# Patient Record
Sex: Male | Born: 1950 | Race: White | Hispanic: No | Marital: Single | Smoking: Never smoker
Health system: Southern US, Community
[De-identification: ages and names within clinical notes are randomized; demographics above are authoritative.]

## PROBLEM LIST (undated history)

## (undated) DIAGNOSIS — E039 Hypothyroidism, unspecified: Secondary | ICD-10-CM

## (undated) DIAGNOSIS — Q909 Down syndrome, unspecified: Secondary | ICD-10-CM

## (undated) DIAGNOSIS — K279 Peptic ulcer, site unspecified, unspecified as acute or chronic, without hemorrhage or perforation: Secondary | ICD-10-CM

## (undated) HISTORY — PX: VAGOTOMY: SUR1431

---

## 1998-05-24 ENCOUNTER — Ambulatory Visit (HOSPITAL_COMMUNITY): Admission: RE | Admit: 1998-05-24 | Discharge: 1998-05-24 | Payer: Self-pay | Admitting: Gastroenterology

## 1999-10-17 ENCOUNTER — Encounter: Payer: Self-pay | Admitting: Gastroenterology

## 1999-10-17 ENCOUNTER — Encounter: Admission: RE | Admit: 1999-10-17 | Discharge: 1999-10-17 | Payer: Self-pay | Admitting: Gastroenterology

## 1999-11-23 ENCOUNTER — Encounter: Admission: RE | Admit: 1999-11-23 | Discharge: 1999-11-23 | Payer: Self-pay | Admitting: Gastroenterology

## 1999-11-23 ENCOUNTER — Encounter: Payer: Self-pay | Admitting: Gastroenterology

## 2000-02-05 ENCOUNTER — Ambulatory Visit (HOSPITAL_COMMUNITY): Admission: RE | Admit: 2000-02-05 | Discharge: 2000-02-05 | Payer: Self-pay | Admitting: Gastroenterology

## 2000-02-08 ENCOUNTER — Encounter: Admission: RE | Admit: 2000-02-08 | Discharge: 2000-02-08 | Payer: Self-pay | Admitting: Gastroenterology

## 2000-02-08 ENCOUNTER — Encounter: Payer: Self-pay | Admitting: Gastroenterology

## 2000-03-01 ENCOUNTER — Ambulatory Visit (HOSPITAL_COMMUNITY): Admission: RE | Admit: 2000-03-01 | Discharge: 2000-03-01 | Payer: Self-pay | Admitting: Gastroenterology

## 2000-03-01 ENCOUNTER — Encounter (INDEPENDENT_AMBULATORY_CARE_PROVIDER_SITE_OTHER): Payer: Self-pay | Admitting: Specialist

## 2000-05-08 ENCOUNTER — Ambulatory Visit (HOSPITAL_COMMUNITY): Admission: RE | Admit: 2000-05-08 | Discharge: 2000-05-08 | Payer: Self-pay | Admitting: Gastroenterology

## 2000-05-08 ENCOUNTER — Encounter (INDEPENDENT_AMBULATORY_CARE_PROVIDER_SITE_OTHER): Payer: Self-pay | Admitting: Specialist

## 2000-08-21 ENCOUNTER — Encounter: Admission: RE | Admit: 2000-08-21 | Discharge: 2000-08-21 | Payer: Self-pay | Admitting: Gastroenterology

## 2000-08-21 ENCOUNTER — Encounter: Payer: Self-pay | Admitting: Gastroenterology

## 2000-12-10 ENCOUNTER — Encounter: Admission: RE | Admit: 2000-12-10 | Discharge: 2000-12-10 | Payer: Self-pay | Admitting: Family Medicine

## 2000-12-10 ENCOUNTER — Encounter: Payer: Self-pay | Admitting: Family Medicine

## 2003-04-20 ENCOUNTER — Ambulatory Visit (HOSPITAL_BASED_OUTPATIENT_CLINIC_OR_DEPARTMENT_OTHER): Admission: RE | Admit: 2003-04-20 | Discharge: 2003-04-20 | Payer: Self-pay | Admitting: Family Medicine

## 2003-10-01 ENCOUNTER — Ambulatory Visit: Admission: RE | Admit: 2003-10-01 | Discharge: 2003-10-01 | Payer: Self-pay | Admitting: Critical Care Medicine

## 2003-10-04 ENCOUNTER — Ambulatory Visit (HOSPITAL_BASED_OUTPATIENT_CLINIC_OR_DEPARTMENT_OTHER): Admission: RE | Admit: 2003-10-04 | Discharge: 2003-10-04 | Payer: Self-pay | Admitting: Internal Medicine

## 2004-10-17 ENCOUNTER — Encounter: Admission: RE | Admit: 2004-10-17 | Discharge: 2004-10-17 | Payer: Self-pay | Admitting: Family Medicine

## 2005-03-30 ENCOUNTER — Encounter: Admission: RE | Admit: 2005-03-30 | Discharge: 2005-03-30 | Payer: Self-pay | Admitting: Family Medicine

## 2005-04-10 ENCOUNTER — Inpatient Hospital Stay (HOSPITAL_COMMUNITY): Admission: EM | Admit: 2005-04-10 | Discharge: 2005-04-13 | Payer: Self-pay | Admitting: Emergency Medicine

## 2005-09-03 HISTORY — PX: ROUX-EN-Y PROCEDURE: SUR1287

## 2006-01-14 ENCOUNTER — Ambulatory Visit (HOSPITAL_COMMUNITY): Admission: RE | Admit: 2006-01-14 | Discharge: 2006-01-14 | Payer: Self-pay | Admitting: Surgery

## 2006-01-22 ENCOUNTER — Encounter (INDEPENDENT_AMBULATORY_CARE_PROVIDER_SITE_OTHER): Payer: Self-pay | Admitting: Specialist

## 2006-01-22 ENCOUNTER — Inpatient Hospital Stay (HOSPITAL_COMMUNITY): Admission: RE | Admit: 2006-01-22 | Discharge: 2006-01-30 | Payer: Self-pay | Admitting: Surgery

## 2007-07-10 ENCOUNTER — Encounter: Admission: RE | Admit: 2007-07-10 | Discharge: 2007-07-10 | Payer: Self-pay | Admitting: Surgical Oncology

## 2011-01-19 NOTE — H&P (Signed)
NAME:  Isaac Ramirez, Isaac Ramirez          ACCOUNT NO.:  [id number]   MEDICAL RECORD NO.:  [id number]          PATIENT TYPE:  EMS   LOCATION:  MAJO                         FACILITY:  [facility]   PHYSICIAN:  Jackie Plum, M.D.DATE OF BIRTH:  Jul 08, 1951   DATE OF ADMISSION:  04/10/2005  DATE OF DISCHARGE:                                HISTORY & PHYSICAL   CHIEF COMPLAINT:  Syncope.   The patient has history of Down's syndrome, and history is mostly given by  patient's mother at bedside.  The patient was brought to the ED on account  of recurrent syncopal episodes today.  According to the mother, the patient  has had some problems with heartburn and has been seeing Dr. Kinnie Scales.  He  last had upper endoscopy done about a week ago, and they noted gastritis.  (From E chart, the patient had an EGD done in September 2001 by Dr. Vida Rigger.  At that time, peripyloric ulcer and obstruction was noted.  According to the mother, since the endoscopy, the patient had been on a  liquid diet.  They went to see Dr. Kinnie Scales today because he was having dry  heaves.  During that visit, he felt the patient should be advanced to a  puree diet.  In addition, the patient has had some lower extremity swelling  for which he was put on Lasix.   Unfortunately, they went home today and this afternoon the patient passed  out while standing, and he was unresponsive and unconscious for a few  minutes.  They got him up, and subsequently he passed out again and also  passed out in the field after being seen by EMT.  He was brought to the ED  whereby initial workup indicated blood pressure of 70/50.  He was given 500  mL normal saline bolus.  The patient continued to be anemic with hemoglobin  of 8.1, and therefore, packed red blood cells transfusion was initiated, and  hospitalists asked for admission.  The patient denies any history of chest  pain.  He says that he is feeling weak and fatigued but denies any shortness  of  breath.  He also indicates that he does not have any abdominal pain but  feels a bit unusual with some abdominal discomfort.   PAST MEDICAL HISTORY:  1.  History of Down's syndrome.  2.  Arthritis.  3.  Gallstones.  4.  Heart murmur.  5.  Sleep apnea.  6.  Ulcer.   CURRENT MEDICATIONS:  Include Nexium, Prilosec, Levothyroxine, hyoscyamine,  Flagyl.  (According to the mother, he had some form of liquid stools for  which he was treated with Flagyl.  Suspect the patient may have had some C.  difficile colitis from the history, but she indicates that his diarrhea has  resolved.)  He also takes multivitamins, Lasix, and calcium.   ALLERGIES:  He is allergic to PENICILLIN.   FAMILY HISTORY:  Negative for any GI problems.   SOCIAL HISTORY:  The patient lives with his mother because of his Down's  syndrome.  Does not smoke cigarettes nor drink alcohol.   PHYSICAL EXAMINATION:  VITAL SIGNS:  Blood pressure 104/61, temperature  98.4, pulse 81, respirations recommended to be 11, but I counted 16 per  minute.  Oxygen 100% on 2 liters by nasal cannula.  GENERAL:  He was not in acute distress.  HEENT:  Normocephalic and atraumatic.  Pupils equal, round, and reactive to  light.  Extraocular movements were intact.  Oropharynx was slightly dry.  He  had pallor present, no icterus.  LUNGS:  Clear to auscultation.  CARDIAC:  Regular.  No gallops.  ABDOMEN:  Full.  Looks distended but nontender.  Bowel sounds are present.  EXTREMITIES:  No cyanosis, no edema.  CNS:  The patient was alert and oriented x 2.  He moves all extremities.  Obeys simple commands.  No obviously focal deficits otherwise.   LABORATORY DATA:  EKG shows sinus rhythm.  There are no acute ST wave  changes.   UA was negative for urinary tract infection.  WBC 11.1, hemoglobin 8.1,  hematocrit 24.5, MCV 98.1, platelet count 597.  Sodium 138, potassium 4.2,  chloride 110, CO2 19, glucose 86, BUN 33, creatinine 2.8, calcium 7.7,  total  protein 5.5, albumin 1.8, AST 16, ALT 11, alkaline phosphatase 61, total  bilirubin 0.4.  Point-of-care cardiac markers were negative.  Hemoccult  stools was positive.   IMPRESSION:  1.  Acute hypotension with anemia and heme-positive stools.  Rule out      gastrointestinal bleed.  2.  Dehydration with possible volume depletion.  3.  Syncope secondary to diagnoses #1 and #2.  4.  Acute renal failure.  5.  Renal insufficiency, not clear if acute or chronic.  I tried to trend on      E chart his previous renal function, but this is all that came up.   PLAN:  1.  The patient will be admitted to a step-down bed.  2.  Will continue with IV fluids supplementation and hydration and follow up      his renal function.  3.  Will complete packed red blood cells transfusion and follow up his      serial hemoglobin and hematocrit.  4.  Dr. Kinnie Scales, patient's GI physician, was paged in ED, but did not get a      response.  I, therefore, put a call to Dr.      Ramon Dredge of [facility], who was on call for unassigned, and discussed the      patient with him.  He may see patient if Dr. Kinnie Scales is never heard from      tonight.  5.  Will check his TSH and also check KUB for completeness sake.       GO/MEDQ  D:  04/10/2005  T:  04/10/2005  Job:  [id number]   cc:   Griffith Citron, M.D.  [facility] [place]  [place] [address]  Fax: [phone]

## 2011-01-19 NOTE — Op Note (Signed)
NAME:  Isaac Ramirez, Isaac Ramirez          ACCOUNT NO.:  [id number]   MEDICAL RECORD NO.:  [id number]          PATIENT TYPE:  INP   LOCATION:  3307                         FACILITY:  [facility]   PHYSICIAN:  Sandria Bales. Ezzard Standing, M.D.  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   OPERATION PERFORMED:  Upper endoscopy, truncal vagotomy, retrocolic Roux-en-  Y gastrojejunostomy, Stamm gastrostomy.   SURGEON:  Sandria Bales. Ezzard Standing, M.D.   FIRST ASSISTANT  Baruch Merl, M.D.   ANESTHESIA:  General.   INDICATIONS FOR PROCEDURE:  Isaac Ramirez is a 60 year old male who has  Down's syndrome, who is taken care of his mother and Dr. Ritta Slot is his  gastroenterologist, Dr. Lupe Carney is his primary care physician.  He has  had progressive duodenal ulcer disease felt to be secondary to chronic NSAID  use.  This ulcer disease has caused a worsening gastric outlet obstruction  and he now comes for abdominal exploration with possible bypass surgery and  possible antrectomy.   The indications and potential complications were explained to the patient's  mother.  Again, there was some deformity in the duodenum both on upper GI  and CT scan which suggests this could be a potential occult malignancy.  Dr.  Kinnie Scales scoped the patient twice with no evidence of malignancy seen.  This  was all explained to the mother preoperatively.   DESCRIPTION OF PROCEDURE:  The patient was placed in supine position, given  a general endotracheal anesthesia, had a Foley catheter in place.  Of note,  he has a small meatus and I had to put a 12 Foley catheter in but I got this  in without difficulty.  He was given 400 mg of Cipro  preoperatively.  His  upper abdomen was shaved, prepped with Betadine solution and sterilely  draped.   I went through an upper midline incision to the abdominal cavity.  Abdominal  exploration revealed right and left lobes of the liver unremarkable.  The  gallbladder I could not palpate any stones or masses.  His stomach was noted  to be chronically thickened, probably secondary to chronic obstruction.  He  had a firm mass at his duodenal sweep with some scarring around that is  consistent with duodenal ulcer disease in the first and second portion of  the duodenum.  His small bowel and lower abdomen was otherwise unremarkable.   Starting first by trying to dissect down and see if I thought I could do an  antrectomy, we had a lot of trouble identifying the pylorus externally.  I  took down some of the greater curvature to see if I could feel this with my  fingers though he had enough deformity of the duodenal bulb it was really  hard to tell where the pylorus ended and where the duodenum began, so we  then did  an upper endoscopy and actually right beyond the pylorus, I think  it was like a deep ulcer or channel.  This upper endoscopy was done by Dr.  Colin Benton while I was holding the scope with an open abdomen.  It seemed that he  had a markedly deformed duodenum, there would be a significant risk if doing  an antrectomy, of duodenal blow out or leak, I felt the best thing was not  to try to do a antrectomy, just do a bypass procedure.  I then went up and  found what I thought was an anterior vagus nerve which was removed between  two clips.  The posterior vagus nerve was removed in between two clips.  There was no other nerve structure that I could identify, no other  suspicious area of any anterior or posterior nerve.   I then went down below the ligament of Treitz.  I followed the small bowel  about 30 cm beyond the ligament of Treitz and did a gastrojejunostomy  approximately 50 cm  beyond what was to be my future bypass.  I did a stapled  side-to-side anastomosis. I closed the enterotomy with two running #2-0  Vicryl sutures.  I then buttressed this with some more 2-0 Vicryl sutures,  closed the mesenteric defect with a 2-0 silk suture.  This then allowed  easily two fingerbreadths through this anastomosis.   I then brought through a retrocolic approach the gastric limb of the Roux-en-  Y.  I sewed this to the posterior wall first with a posterior running 2-0  Vicryl suture.  I then used the 45 Endo GIA stapler with a blue load to put  a staple load on the back wall of the stomach through enterotomy to create a  gastrojejunostomy. I  then closed enterotomy with two running 2-0 Vicryl  sutures and then I closed the anterior wall over the jejunum with a running  2-0 Vicryl.   After this was completed, there was at least a two to three fingerbreadth  opening.  I really thought I had a 4 to 5 cm opening on the posterior wall  of the stomach.  I was not able to bring this down below the transverse  colon mesentery so I then tacked the transverse colon mesentery to the  jejunum in two spots.  Then I placed Tisseel over the gastrojejunostomy to  seal over the jejunojejunostomy.  This lay flat.  I thought it had ample  room for the small bowel to empty.  There was no twist in the small bowel or  the Roux limb.  I then returned the colon to its normal location.   I then irrigated the abdomen out with about 2 L of saline, reinspected the  area where I had done the vagotomy, reinspected the duodenal C-loop which I  had kocherized, reinspected the gastrojejunostomy, reinspected the  jejunojejunostomy.   Because the patient has Down's syndrome and though that he could have a  prolonged NG tube, I thought he would be best served by placing a  gastrostomy so I used a cut off 24 mushroom catheter, I placed through the anterior abdominal wall, through a double pursestring on the  anterior wall  of the stomach.  I tacked the stomach up in four sites with 2-0 Vicryl  suture, I put the gastrostomy tube to straight drain.   I then closed the abdomen with two running #1 PDS sutures.  I placed skin  staples to the skin. I  sterilely dressed the wound.  The patient tolerated  the procedure well, was transported to recovery room in good condition.      Sandria Bales. Ezzard Standing, M.D.  Electronically Signed     DHN/MEDQ  D:  01/22/2006  T:  01/23/2006  Job:  [id number]   cc:   Griffith Citron, M.D.  Fax: [phone]   L. Lupe Carney, M.D.  Fax: [phone]   Shan Levans, M.D. [facility]  [address]  [place]  [place] [address]

## 2011-01-19 NOTE — Op Note (Signed)
NAME:  Isaac Ramirez, Isaac Ramirez          ACCOUNT NO.:  [id number]   MEDICAL RECORD NO.:  [id number]          PATIENT TYPE:  INP   LOCATION:  4708                         FACILITY:  [facility]   PHYSICIAN:  Bernette Redbird, M.D.   DATE OF BIRTH:  [birth year]   DATE OF PROCEDURE:  04/12/2005  DATE OF DISCHARGE:                                 OPERATIVE REPORT   PROCEDURE:  Flexible sigmoidoscopy.   INDICATION:  This is a 60 year old Downs patient with small volume  hematochezia in the hospital today, having been admitted with heme-positive  stool, syncope, hypotension and anemia, in the setting of a recently  diagnosed recurrent prepyloric ulcer and Lasix usage for fluid retention.   FINDINGS:  Mild to moderate internal hemorrhoids, one of which appeared to  have a possible stigma of recent hemorrhage. No blood in the GI tract.   PROCEDURE:  The nature, purpose, risks of the procedure had been reviewed  with the patient's mother who provided consent on his behalf. He was brought  from his hospital room to the endoscopy unit. The procedure was done  unprepped. No sedation was administered. Perianal exam was unremarkable  apart from a small excoriation in the gluteal cleft. Digital exam showed  what felt to be palpable hemorrhoids within the anal canal and a very small  prostate gland, no masses.   The Olympus pediatric adjustable video colonoscope was inserted and advanced  to about 40 cm and pullback was then performed.   There was absolutely no blood whatsoever anywhere in this examination.   The patient did have several clumps of formed brown stool scattered in the  rectosigmoid region. The stool was free of any inspissated blood or  streaking of blood. The mucosa of the colon looked healthy without any  evident colitis and I did not appreciate any diverticulosis, vascular  ectasia, polyps or masses. Retroflexion in the rectum was unremarkable.   Careful pullout through the anal canal  demonstrated moderate internal  hemorrhoids, one of which had a purple mucosal blob and suggestive of a  stigma of recent hemorrhage.   No biopsies were obtained. The patient tolerated the procedure well and  there no apparent complications.   IMPRESSION:  Rectal bleeding, presumably of rectal outlet origin given the  negative findings on today's exam in the rectum and sigmoid, and given the  presence of internal hemorrhoids as noted above (569.3).   PLAN:  Expectant management. Continue ulcer therapy.       RB/MEDQ  D:  04/12/2005  T:  04/13/2005  Job:  [id number]   cc:   L. Lupe Carney, M.D.  [address] [place]  [place] [address]  Fax: [phone]   Griffith Citron, M.D.  [facility] [place]  [place] [address]  Fax: [phone]

## 2011-01-19 NOTE — Procedures (Signed)
[place]. [facility]  Patient:    Isaac Ramirez, Isaac Ramirez                 MRN: [id number] Proc. Date: 02/05/00 Adm. Date:  [id number] Attending:  Nelda Marseille CC:         Cherlyn Labella, M.D., [facility] [facility], [place]                           Procedure Report  PROCEDURE:  Esophagogastroduodenoscopy.  INDICATIONS:  A patient with questionable sprue.  Want to repeat endoscopy and proceed with small bowel biopsies.  Consent was signed after the risks, benefits, methods, and options were thoroughly discussed multiple times in the past with Mrs. Holtsclaw.  MEDICINES USED:  Demerol 30 mg and Versed 5 mg.  DESCRIPTION OF PROCEDURE:  We initially started the procedure with the pediatric endoscope which was advanced to the stomach.  He seemed to possibly have some pyloric narrowing, but we could not tell for sure.  The scope seemed to continue to loop in the stomach and this did not seem to be flexible enough to advance through the pylorus.  The scope was retroflexed, which revealed a normal cardia, fundus, angularis, and lesser and greater curves.  The stomach was evaluated on straight visualization.  The scope was slowly withdrawn since it could not be advanced through the pylorus and his esophagus was normal, except for a small hiatal hernia.  We went ahead and tried to advance the pediatric colonoscope next to see if a firmer scope would be helpful, but this was too big to get through the pylorus and it was fairly quickly withdrawn. We reinserted the regular 140 upper endoscope which again would not go through the pylorus due to seemingly pyloric stenosis.  There was some trauma to the pylorus in trying to pass these scopes, which did have some friability.  No active significant bleeding.  The stomach was reevaluated on retroflex and straight visualization without additional findings.  Air was suctioned and the scope was withdrawn.  Again a good look at  the esophagus confirmed the above findings.  The scope was removed.  The patient tolerated the procedure adequately.  There was no obvious immediate complication.  ENDOSCOPIC DIAGNOSES: 1. Small hiatal hernia. 2. 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  Unable to    advance either our 160 pediatric colonoscope or our 140 scope.  PLAN: 1. Will check his old EGD dictation to see if this was a problem in the past,    but I do not remember it. 2. Probably will proceed with repeat upper GI with small bowel follow through    to reevaluate his anatomy. 3. Consideration of a balloon dilatation to pass the scope was discussed with    Dr. Link Snuffer to get any further work-up or plans and ideas from him. DD:  02/05/00 TD:  02/07/00 Job: [id number]

## 2011-01-19 NOTE — Procedures (Signed)
[place]. [facility]  Patient:    Isaac Ramirez, Isaac Ramirez                 MRN: [id number] Proc. Date: 03/01/00 Adm. Date:  [id number] Attending:  Nelda Marseille CC:         Petra Kuba, M.D.             Randel Pigg, M.D., [facility], [place]                           Procedure Report  PROCEDURE:  Esophagogastroduodenoscopy with biopsy.  INDICATIONS:  Patient with pyloric stenosis on last endoscopy, want a repeat endoscopy balloon to dilate the pylorus and also take a biopsy for possible sprue.  INFORMED CONSENT:  Consent was signed after risk, benefits, methods and options were thoroughly discussed with the mother on multiple occasions.  MEDICINES USED:  Demerol 40 mg, Versed 4 mg.  DESCRIPTION OF PROCEDURE:  The video endoscope was inserted by direct vision. In the esophagus he had some small linear esophageal ulcers, not noticed before.  He did have a small hiatal hernia.  The scope was inserted into the stomach and some old food was seen, some of which was washed and suctioned. The scope was then advanced to the antrum and in the pylorus there was a even more stricturing than had been seen three weeks ago, and now he had an active peripyloric ulcer.  The scope cannot be advanced through this area.  The ulcer had a deep white base.  We elected not to balloon dilate at this time due to the active ulceration and increased risk.  We cannot see through the pylorus. The scope was withdrawn back to the stomach and retroflexed.  The cardia and the fundus were normal.  The remainder of the stomach was normal on retroflexion and straight visualization except for the area under the residual  food that could not be suctioned, which was in the proximal stomach.  We then advanced to the antrum.  Biopsy for the CLOtest was obtained.  Two biopsies of the antrum and two of the fundus were obtained to rule out Helicobacter and then we took  two small biopsies of the edge of the proximal ulcer.  Air was suctioned and the scope slowly withdrawn.  Again, a good look at the esophagus confirmed the above findings.  The scope was removed.  The patient tolerated the procedure well.  There was no obvious immediate complications.  ENDOSCOPIC DIAGNOSES: 1. Linear mild esophageal ulcers. 2. Small hiatal hernia. 3. Peripyloric deep ulcer with pyloric scarring, unable to pass the scope    or even see into the bulb. 4. Otherwise within normal limits, status post biopsy of the stomach for    both CLO and Helicobacter as well as two biopsies of the edge of the ulcer.  PLAN:  Await pathology to rule out Helicobacter.  We will discuss avoiding salads or other foods difficult to digest.  He will chew his food well.  Six small meals a day.  Prilosec 40 mg a day.  Call p.r.n. and otherwise followup in one to two months.  I have discussed everything thoroughly with his wife about the risk of this getting worse with healing the ulcer and him needing surgery and she will be thinking about those options.  In the meantime I think the warning signs of things getting worse would be obvious and  it would be okay for him to go to camp since she knows the director well and we could get him back here if any question or problems fairly quickly, and otherwise I will see him after camp and probably proceed with repeat endoscopy at that time for reevaluation.  He will avoid aspirin, nonsteroidals which he has not been on. DD:  03/01/00 TD:  03/02/00 Job: [id number]

## 2011-01-19 NOTE — Consult Note (Signed)
NAME:  Isaac Ramirez, Isaac Ramirez          ACCOUNT NO.:  [id number]   MEDICAL RECORD NO.:  [id number]          PATIENT TYPE:  INP   LOCATION:  4708                         FACILITY:  [facility]   PHYSICIAN:  Bernette Redbird, M.D.   DATE OF BIRTH:  Nov 18, 1950   DATE OF CONSULTATION:  04/12/2005  DATE OF DISCHARGE:                                   CONSULTATION   Dr. Cammie Mcgee L. Lendell Caprice of the [facility] asked [place] to see this 60-  year-old patient of Dr. Jennye Boroughs because of small volume hematochezia.   Isaac Ramirez is a 60 year old with Down syndrome and a previous history of peptic  ulcer disease diagnosed by Dr. Lavada Mesi Magod five or more years ago.   With that background, the patient had been doing poorly in recent weeks  while at summer camp, with swelling of his extremities, apparently arthritis  symptoms, and lower extremity edema. When checked in Dr. Quita Skye office  on March 28, 2005, he was found to have hemoglobin of 9.2, as compared to  14.5 in June 2005 roughly a year earlier. After this, he underwent  endoscopic evaluation by Dr. Kinnie Scales and was found to have a significant  prepyloric ulcer. The patient was put on a softer pureed diet because he had  been having a lot of gagging during meals and regurgitation of fluid. Those  symptoms had settled down pretty well.   He was then admitted to this hospital two days ago after becoming syncopal  at home ironically shortly after being seen in the office by Dr. Kinnie Scales. On  admission, the patient's hemoglobin was low at 8.1, roughly a gram and half  lower than it had been in the office for Dr. Clovis Riley, and he was also  hypotensive, perhaps due to Lasix therapy but with an elevated BUN of 43 and  creatinine of 2.9. He was hemoccult positive on admission, although it is  not clear that he was actually having any visibly bloody stool. Prior to  admission, his mother had been checking all of his bowel movements and had  not noticed any tarry  stools.   The patient was being treated with supportive care in-house but today passed  red blood into the commode in association with the bowel movement. It is  thought that it was probably about a cup of blood but nonetheless,  considering all factors, it was felt appropriate to consult GI in the  setting and Dr. Kinnie Scales was out of town and not available.   Note, that the patient had a negative colonoscopy by Dr. Kinnie Scales  approximately two years ago, per discussion with Dr. Lendell Caprice.   The patient's hemoglobin today has been essentially stable over the past 24  hours, dropping from 10.9-10.3. The patient's BUN is improved at 14.   The patient has been maintained on PPI therapy chronically for years and has  not been on ulcerogenic medications to my knowledge.   ALLERGIES:  Stated allergy to PENICILLIN.   OUTPATIENT MEDICATIONS:  Imodium, lactate, levothyroxine, metronidazole,  Tylenol, vitamins, Lasix, Nexium, and possibly digestive enzymes.   OPERATIONS:  None of which I am aware.  MEDICAL ILLNESSES:  1.  History of Down syndrome.  2.  Arthritis.  3.  Gallstones (not operated).  4.  History of heart murmur.  5.  Sleep apnea.  6.  History of ulcer disease as noted above.   HABITS:  Nonsmoker, nondrinker.   FAMILY HISTORY:  Not obtained.   SOCIAL HISTORY:  Lives at home with his mother who is at the bedside and is  very attentive and helpful.   REVIEW OF SYSTEMS:  Pertinent per HPI. The patient's appetite had been poor  recently but more recently has improved back to its baseline and he has been  free of the gagging noted. He has a tendency toward IBS and spastic colon  which is treated with multiple medications by Dr. Kinnie Scales and this has been  under good control recently.   PHYSICAL EXAMINATION:  GENERAL:  A very pleasant, somewhat overweight  Caucasian Isaac Ramirez male in absolutely no distress.  HEENT:  Anicteric. No frank pallor.  CHEST:  Clear anteriorly.  HEART:   Normal, murmur not appreciated present.  ABDOMEN:  Obese but without succussion splash, significant tympany,  guarding, mass, or tenderness.   LABORATORY DATA:  See HPI.   IMPRESSION:  1.  Small-volume hematochezia.  2.  Recently diagnosed recurrent prepyloric ulcer.  3.  Anemia, etiology unclear but clearly has developed over the past year.  4.  History of spastic colon with diarrhea, under reasonable control at      present by medical therapy through Dr. Kinnie Scales.   PLAN:  1.  Flexible sigmoidoscopy this afternoon (nature, purpose and risks      reviewed) to make sure that the small-volume hematochezia is of rectal      outlet origin and not arising from the intestinal tract itself.  2.  Upper GI series in the morning to help define the anatomy and nature of      that there is not an esophageal stricture or significant gastric outlet      obstruction; previous endoscopic evaluation by Dr. Ewing Schlein in 2001 had      shown a scarred pylorus with a prepyloric diverticulum.       RB/MEDQ  D:  04/12/2005  T:  04/13/2005  Job:  [id number]   cc:   L. Lupe Carney, M.D.  [address] [place]  [place] [address]  Fax: [phone]   Griffith Citron, M.D.  [facility] [place]  [place] [address]  Fax: [phone]

## 2011-01-19 NOTE — Discharge Summary (Signed)
NAME:  Isaac Ramirez, Isaac Ramirez          ACCOUNT NO.:  [id number]   MEDICAL RECORD NO.:  [id number]          PATIENT TYPE:  INP   LOCATION:  5712                         FACILITY:  [facility]   PHYSICIAN:  Sandria Bales. Ezzard Standing, M.D.  DATE OF BIRTH:  04/27/1951   DATE OF ADMISSION:  01/22/2006  DATE OF DISCHARGE:                                 DISCHARGE SUMMARY   DISCHARGE DIAGNOSES:  1.  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  2.  Down's syndrome.  3.  Sleep apnea on constant positive airway pressure.  4.  Corrected hypothyroidism.  5.  History of spastic colon.  6.  Urinary retention, resolved.   OPERATION PERFORMED:  The patient had a retrocolic gastrojejunostomy (Roux-  en-Y), truncal vagotomy, upper endoscopy, and Stamm gastrostomy with a #24  mushroom catheter  by Dr. Ovidio Kin on Jan 22, 2006.   HISTORY OF ILLNESS:  Mr. Ganaway is a 60 year old male who has Down's  syndrome, is a patient of Dr. August Saucer Mitchell's, has been followed from a GI  standpoint by Dr. Ritta Slot and sees Dr. Danise Mina from a pulmonary  standpoint for sleep apnea.  He has had progressive gastric outlet  obstructions felt to be secondary to duodenal ulcer disease.  He has been  treated for approximately 1 year with progressive evidence of gastric outlet  obstruction that he has had increasing abdominal distention, burping, and  weight loss.   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  A CT scan suggested some changes around his  pancreas but in general, less thickening of the duodenal bowel, so he comes  to the hospital for abdominal exploration and bypass of this obstructing  duodenal ulcer disease.   Again, his past medical history is significant that:  1.  He is followed by Dr. Danise Mina for sleep apnea, is on CPAP at home.  2.  He has hypothyroidism which is corrected.  3.  He has a history of a spastic colon  and loose stools and diarrhea,      taking Lomotil.   He is cared for very intensely by his mother who is his constant caregiver.   HOSPITAL COURSE:  The patient presented to the hospital on the day of  admission where he underwent an abdominal exploration.  It was felt that  these changes in his duodenum were all inflammatory and consistent with  ulcer disease and not malignant in nature.  Therefore, he underwent a  truncal vagotomy, a retrocolic gastrojejunostomy in a Roux-en-Y fashion, an  upper endoscopy, and I placed a Stamm gastrostomy with a #24 mushroom in  case he had some trouble emptying postoperatively.   His postoperative course, he did well on the first postoperative day.  His  mother stayed with him during the entire hospitalization.  His hemoglobin  was 12; hematocrit 37; white blood count of 18,400.  His sodium was 136,  potassium 4.6, chloride 103, CO2 of 27, BUN of 14, creatinine of 1.3.  I did  place a Foley in that I left in  for 2 days and then removed.  I also  observed him in the stepdown ICU for 2 days and then moved him to the  regular floor.  However, after trying to remove his Foley catheter, he  required in-and-out catheterization several times so we replaced the Foley  and left this in for a couple more days.  On the second removal of his Foley  he did well passing urine.   By the sixth postoperative day he was tolerating his gastrostomy tube  clamped so I started him on liquids.  We restarted him on his home  medicines.  His diet was advanced.  He is now 8 days postoperative,  tolerating a regular diet, he is afebrile, his abdominal incision looks  good, his gastrostomy tube has been clamped for several days, and he is  voiding without difficulty.   His final pathology of the vagus nerve for both anterior vagus and posterior  vagus revealed neurofibers consistent with vagus nerve.   He is now ready for discharge.   DISCHARGE INSTRUCTIONS:  He will  resume his home medications which include:  1.  Nexium 40 mg daily.  2.  Levothyroxine 125 mcg daily.  3.  Tylenol for pain.  4.  He is given Vicodin one to two tablets every 4 hours for pain to go home      with.   He will leave the gastrostomy tube clamped unless he becomes nauseated.  He  will see me back in my office in 2-3 weeks for a wound check.   DISCHARGE CONDITION:  Good.      Sandria Bales. Ezzard Standing, M.D.  Electronically Signed     DHN/MEDQ  D:  01/30/2006  T:  01/30/2006  Job:  [id number]   cc:   L. Lupe Carney, M.D.  Fax: [phone]   Griffith Citron, M.D.  Fax: [phone]   Shan Levans, M.D. [facility]  [address]  [place]  [place] [address]

## 2011-01-19 NOTE — Discharge Summary (Signed)
NAME:  Isaac Ramirez, Isaac Ramirez          ACCOUNT NO.:  [id number]   MEDICAL RECORD NO.:  [id number]          PATIENT TYPE:  INP   LOCATION:  4708                         FACILITY:  [facility]   PHYSICIAN:  Corinna L. Lendell Caprice, MDDATE OF BIRTH:  1950-12-31   DATE OF ADMISSION:  04/10/2005  DATE OF DISCHARGE:  04/13/2005                                 DISCHARGE SUMMARY   DIAGNOSES:  1.  Syncope.  2.  Hypotension.  3.  Prerenal azotemia/acute renal insufficiency.  4.  Blood loss anemia.  5.  Pyloric channel ulcer with relative outlet obstruction.  6.  Lower gastrointestinal bleed secondary to internal hemorrhoids.  7.  Down syndrome.  8.  Arthritis.  9.  Peripheral edema.   DISCHARGE MEDICATIONS:  He is to stop his Lasix. Stop Metronidazole.  Continue Synthroid and Nexium b.i.d. Synthroid is a 100 mcg a day.   DIET:  Should be pureed with adequate fluid intake.   CONDITION:  Stable.   ACTIVITY:  Ad lib.   FOLLOW UP:  Dr. Kinnie Scales at which time a repeat hemoglobin and hematocrit  should be checked or this can be done with Dr. Clovis Riley as well.   CONSULTATIONS:  Dr. Matthias Hughs.   PROCEDURES:  Flexible sigmoidoscopy showing a internal hemorrhoids.   HISTORY AND HOSPITAL COURSE:  Isaac Ramirez is a very nice 60 year old  white male with Down syndrome who had several syncopal episodes and was  brought to the emergency room by his mother. He was found to have a systolic  blood pressure of seven which responded well to IV fluids. He was also found  to have a hemoglobin of 8.1 and he had heme-positive stool. He had recently  had an endoscopy but by Dr. Kinnie Scales showing prepyloric ulcer and relative  outlet obstruction. Several attempts were made to contact Dr. Kinnie Scales. We  were able to get records from his office; however, he and apparently was out  of town and had no coverage.   He initially was felt to have a very slow GI bleed due to the ulcer and he  was initially managed medically. He  subsequently, however, had some  hematochezia and Dr. Matthias Hughs was consulted. This was felt to be hemorrhoidal  bleeding. The patient's Lasix was held as it was felt to be contributing to  his syncope. He was given IV fluids. His blood pressure normalized. He had  no dizziness and was ambulating fine. His hemoglobin at discharge was 10.9.  His coagulation panel is normal. Ferritin was 106. Initial CMET was  significant for an albumin of 1.8, a creatinine of 2.9, BUN of 43. TSH was  8.8 and his Synthroid will be increased to 125 mcg a day. At discharge, his  creatinine was 1.3 and his BUN was 8. The patient remained on telemetry and  had normal sinus rhythm throughout.      Corinna L. Lendell Caprice, MD  Electronically Signed     CLS/MEDQ  D:  04/13/2005  T:  04/14/2005  Job:  [id number]   cc:   L. Lupe Carney, M.D.  [address] [place]  [place] [address]  Fax: [phone]  Bernette Redbird, M.D.  [address] [place]  [place], [place] [address]  Fax: [phone]   Griffith Citron, M.D.  [facility] [place]  [place] [address]  Fax: [phone]

## 2011-01-19 NOTE — Procedures (Signed)
[facility]  Patient:    Isaac Ramirez, Isaac Ramirez                 MRN: [id number] Proc. Date: 05/08/00 Adm. Date:  [id number] Attending:  Nelda Marseille CC:         Merla Riches, M.D.   Procedure Report  PROCEDURE:  Esophagogastroduodenoscopy with biopsy.  INDICATIONS FOR PROCEDURE:  A patient with peripyloric ulcer and obstruction doing well clinically. Want to repeat the endoscopy to reevaluate the ulcer and possibly biopsy the small bowel for sprue.  Consent was signed after risks, benefits, methods, and options were thoroughly discussed with both Mr. Mitzel and his mother on multiple occasions.  MEDICINES USED:  Demerol 30, Versed 3.  DESCRIPTION OF PROCEDURE:  The video endoscope was inserted by direct vision. The esophagus was grossly normal. The scope was inserted into the stomach and advanced through the pylorus where there was on further ulceration seen but there was some stenosis and a peripyloric diverticula. We were unable to advance through the scarred pylorus with the regular scope. Unfortunately, the pediatric scope broke earlier in the day and we were unable to advance any further scopes. We were unable to try with any further smaller scopes. The scope was retroflexed revealing a normal cardia, fundus, angularis, lesser and greater curve except for some mild gastritis. Straight visualization in the stomach was normal. We did try to readvance one more time without success. Air was suctioned after 2 biopsies of the antrum and 2 of the proximal stomach were obtained to rule out Helicobacter. A quick look at the esophagus on slow withdrawal was normal. The scope was removed. The patient tolerated the procedure well. There was no obvious or immediate complication.  ENDOSCOPIC DIAGNOSIS: 1. Minimal gastritis status post biopsy. 2. Scarred pylorus with a peripyloric diverticula, unable to advance the    regular video endoscopy. No  other smaller scopes available at the current    time. 3. Otherwise within normal limits esophagogastroduodenoscopy.  PLAN:  Await pathology and will treat H. pylori if positive. Await gastrin. Consider rechecking EGD down the road versus just following clinically which he has been doing very well on and the mother seems to agree with that plan. Will talk to him about the biopsies. He will call me p.r.n. and otherwise follow-up in 6-8 weeks. DD:  05/08/00 TD:  05/08/00 Job: [id number]

## 2011-10-03 DIAGNOSIS — J069 Acute upper respiratory infection, unspecified: Secondary | ICD-10-CM | POA: Diagnosis not present

## 2012-01-24 DIAGNOSIS — B351 Tinea unguium: Secondary | ICD-10-CM | POA: Diagnosis not present

## 2012-06-26 DIAGNOSIS — R05 Cough: Secondary | ICD-10-CM | POA: Diagnosis not present

## 2012-09-22 DIAGNOSIS — K589 Irritable bowel syndrome without diarrhea: Secondary | ICD-10-CM | POA: Diagnosis not present

## 2012-09-22 DIAGNOSIS — D649 Anemia, unspecified: Secondary | ICD-10-CM | POA: Diagnosis not present

## 2012-09-22 DIAGNOSIS — E039 Hypothyroidism, unspecified: Secondary | ICD-10-CM | POA: Diagnosis not present

## 2012-09-22 DIAGNOSIS — K219 Gastro-esophageal reflux disease without esophagitis: Secondary | ICD-10-CM | POA: Diagnosis not present

## 2012-09-22 DIAGNOSIS — M159 Polyosteoarthritis, unspecified: Secondary | ICD-10-CM | POA: Diagnosis not present

## 2013-01-15 DIAGNOSIS — R141 Gas pain: Secondary | ICD-10-CM | POA: Diagnosis not present

## 2013-01-15 DIAGNOSIS — R143 Flatulence: Secondary | ICD-10-CM | POA: Diagnosis not present

## 2013-01-15 DIAGNOSIS — K219 Gastro-esophageal reflux disease without esophagitis: Secondary | ICD-10-CM | POA: Diagnosis not present

## 2013-01-15 DIAGNOSIS — Z1211 Encounter for screening for malignant neoplasm of colon: Secondary | ICD-10-CM | POA: Diagnosis not present

## 2013-04-30 DIAGNOSIS — R152 Fecal urgency: Secondary | ICD-10-CM | POA: Diagnosis not present

## 2013-04-30 DIAGNOSIS — K219 Gastro-esophageal reflux disease without esophagitis: Secondary | ICD-10-CM | POA: Diagnosis not present

## 2013-04-30 DIAGNOSIS — R141 Gas pain: Secondary | ICD-10-CM | POA: Diagnosis not present

## 2013-12-24 DIAGNOSIS — Z0000 Encounter for general adult medical examination without abnormal findings: Secondary | ICD-10-CM | POA: Diagnosis not present

## 2013-12-24 DIAGNOSIS — E039 Hypothyroidism, unspecified: Secondary | ICD-10-CM | POA: Diagnosis not present

## 2013-12-24 DIAGNOSIS — R7301 Impaired fasting glucose: Secondary | ICD-10-CM | POA: Diagnosis not present

## 2013-12-24 DIAGNOSIS — Q909 Down syndrome, unspecified: Secondary | ICD-10-CM | POA: Diagnosis not present

## 2013-12-24 DIAGNOSIS — K219 Gastro-esophageal reflux disease without esophagitis: Secondary | ICD-10-CM | POA: Diagnosis not present

## 2015-05-10 DIAGNOSIS — E039 Hypothyroidism, unspecified: Secondary | ICD-10-CM | POA: Diagnosis not present

## 2015-05-10 DIAGNOSIS — M150 Primary generalized (osteo)arthritis: Secondary | ICD-10-CM | POA: Diagnosis not present

## 2015-05-10 DIAGNOSIS — Q909 Down syndrome, unspecified: Secondary | ICD-10-CM | POA: Diagnosis not present

## 2015-05-10 DIAGNOSIS — Z0000 Encounter for general adult medical examination without abnormal findings: Secondary | ICD-10-CM | POA: Diagnosis not present

## 2015-05-10 DIAGNOSIS — Z23 Encounter for immunization: Secondary | ICD-10-CM | POA: Diagnosis not present

## 2015-05-10 DIAGNOSIS — K219 Gastro-esophageal reflux disease without esophagitis: Secondary | ICD-10-CM | POA: Diagnosis not present

## 2015-08-10 ENCOUNTER — Encounter (HOSPITAL_COMMUNITY): Payer: Self-pay | Admitting: Emergency Medicine

## 2015-08-10 ENCOUNTER — Emergency Department (HOSPITAL_COMMUNITY): Payer: Medicare Other

## 2015-08-10 ENCOUNTER — Inpatient Hospital Stay (HOSPITAL_COMMUNITY)
Admission: EM | Admit: 2015-08-10 | Discharge: 2015-08-24 | DRG: 812 | Disposition: A | Discharge status: Home / Self Care | Payer: Medicare Other | Attending: Internal Medicine | Admitting: Internal Medicine

## 2015-08-10 DIAGNOSIS — K311 Adult hypertrophic pyloric stenosis: Secondary | ICD-10-CM | POA: Diagnosis not present

## 2015-08-10 DIAGNOSIS — Z8711 Personal history of peptic ulcer disease: Secondary | ICD-10-CM

## 2015-08-10 DIAGNOSIS — Z79899 Other long term (current) drug therapy: Secondary | ICD-10-CM

## 2015-08-10 DIAGNOSIS — K8020 Calculus of gallbladder without cholecystitis without obstruction: Secondary | ICD-10-CM | POA: Diagnosis not present

## 2015-08-10 DIAGNOSIS — D72829 Elevated white blood cell count, unspecified: Secondary | ICD-10-CM | POA: Diagnosis not present

## 2015-08-10 DIAGNOSIS — R109 Unspecified abdominal pain: Secondary | ICD-10-CM | POA: Diagnosis present

## 2015-08-10 DIAGNOSIS — Z980 Intestinal bypass and anastomosis status: Secondary | ICD-10-CM

## 2015-08-10 DIAGNOSIS — K219 Gastro-esophageal reflux disease without esophagitis: Secondary | ICD-10-CM | POA: Diagnosis not present

## 2015-08-10 DIAGNOSIS — R7881 Bacteremia: Secondary | ICD-10-CM | POA: Diagnosis not present

## 2015-08-10 DIAGNOSIS — K289 Gastrojejunal ulcer, unspecified as acute or chronic, without hemorrhage or perforation: Secondary | ICD-10-CM | POA: Diagnosis present

## 2015-08-10 DIAGNOSIS — D473 Essential (hemorrhagic) thrombocythemia: Secondary | ICD-10-CM | POA: Diagnosis present

## 2015-08-10 DIAGNOSIS — D649 Anemia, unspecified: Secondary | ICD-10-CM | POA: Diagnosis not present

## 2015-08-10 DIAGNOSIS — K279 Peptic ulcer, site unspecified, unspecified as acute or chronic, without hemorrhage or perforation: Secondary | ICD-10-CM | POA: Diagnosis present

## 2015-08-10 DIAGNOSIS — D62 Acute posthemorrhagic anemia: Secondary | ICD-10-CM | POA: Diagnosis not present

## 2015-08-10 DIAGNOSIS — N179 Acute kidney failure, unspecified: Secondary | ICD-10-CM | POA: Diagnosis not present

## 2015-08-10 DIAGNOSIS — K263 Acute duodenal ulcer without hemorrhage or perforation: Secondary | ICD-10-CM | POA: Diagnosis not present

## 2015-08-10 DIAGNOSIS — K439 Ventral hernia without obstruction or gangrene: Secondary | ICD-10-CM | POA: Diagnosis not present

## 2015-08-10 DIAGNOSIS — K265 Chronic or unspecified duodenal ulcer with perforation: Secondary | ICD-10-CM

## 2015-08-10 DIAGNOSIS — N289 Disorder of kidney and ureter, unspecified: Secondary | ICD-10-CM | POA: Diagnosis present

## 2015-08-10 DIAGNOSIS — N390 Urinary tract infection, site not specified: Secondary | ICD-10-CM | POA: Diagnosis not present

## 2015-08-10 DIAGNOSIS — D638 Anemia in other chronic diseases classified elsewhere: Secondary | ICD-10-CM | POA: Diagnosis present

## 2015-08-10 DIAGNOSIS — R112 Nausea with vomiting, unspecified: Secondary | ICD-10-CM | POA: Diagnosis not present

## 2015-08-10 DIAGNOSIS — B961 Klebsiella pneumoniae [K. pneumoniae] as the cause of diseases classified elsewhere: Secondary | ICD-10-CM | POA: Diagnosis not present

## 2015-08-10 DIAGNOSIS — R509 Fever, unspecified: Secondary | ICD-10-CM | POA: Diagnosis not present

## 2015-08-10 DIAGNOSIS — R000 Tachycardia, unspecified: Secondary | ICD-10-CM | POA: Diagnosis present

## 2015-08-10 DIAGNOSIS — N183 Chronic kidney disease, stage 3 (moderate): Secondary | ICD-10-CM | POA: Diagnosis present

## 2015-08-10 DIAGNOSIS — N189 Chronic kidney disease, unspecified: Secondary | ICD-10-CM | POA: Diagnosis not present

## 2015-08-10 DIAGNOSIS — Q909 Down syndrome, unspecified: Secondary | ICD-10-CM | POA: Insufficient documentation

## 2015-08-10 DIAGNOSIS — R1084 Generalized abdominal pain: Secondary | ICD-10-CM | POA: Diagnosis not present

## 2015-08-10 DIAGNOSIS — E860 Dehydration: Secondary | ICD-10-CM | POA: Diagnosis present

## 2015-08-10 DIAGNOSIS — Z880 Allergy status to penicillin: Secondary | ICD-10-CM

## 2015-08-10 DIAGNOSIS — D509 Iron deficiency anemia, unspecified: Secondary | ICD-10-CM | POA: Diagnosis not present

## 2015-08-10 DIAGNOSIS — R933 Abnormal findings on diagnostic imaging of other parts of digestive tract: Secondary | ICD-10-CM | POA: Diagnosis not present

## 2015-08-10 DIAGNOSIS — K269 Duodenal ulcer, unspecified as acute or chronic, without hemorrhage or perforation: Secondary | ICD-10-CM | POA: Diagnosis not present

## 2015-08-10 DIAGNOSIS — D500 Iron deficiency anemia secondary to blood loss (chronic): Secondary | ICD-10-CM | POA: Diagnosis present

## 2015-08-10 DIAGNOSIS — K8000 Calculus of gallbladder with acute cholecystitis without obstruction: Secondary | ICD-10-CM | POA: Diagnosis not present

## 2015-08-10 DIAGNOSIS — E039 Hypothyroidism, unspecified: Secondary | ICD-10-CM | POA: Diagnosis present

## 2015-08-10 DIAGNOSIS — Z882 Allergy status to sulfonamides status: Secondary | ICD-10-CM

## 2015-08-10 DIAGNOSIS — K283 Acute gastrojejunal ulcer without hemorrhage or perforation: Secondary | ICD-10-CM | POA: Diagnosis not present

## 2015-08-10 DIAGNOSIS — K922 Gastrointestinal hemorrhage, unspecified: Secondary | ICD-10-CM | POA: Diagnosis not present

## 2015-08-10 HISTORY — DX: Down syndrome, unspecified: Q90.9

## 2015-08-10 HISTORY — DX: Peptic ulcer, site unspecified, unspecified as acute or chronic, without hemorrhage or perforation: K27.9

## 2015-08-10 HISTORY — DX: Hypothyroidism, unspecified: E03.9

## 2015-08-10 LAB — COMPREHENSIVE METABOLIC PANEL
ALBUMIN: 2.3 g/dL — AB (ref 3.5–5.0)
ALK PHOS: 54 U/L (ref 38–126)
ALT: 12 U/L — ABNORMAL LOW (ref 17–63)
ANION GAP: 8 (ref 5–15)
AST: 20 U/L (ref 15–41)
BUN: 26 mg/dL — ABNORMAL HIGH (ref 6–20)
CALCIUM: 8.9 mg/dL (ref 8.9–10.3)
CO2: 27 mmol/L (ref 22–32)
Chloride: 104 mmol/L (ref 101–111)
Creatinine, Ser: 1.94 mg/dL — ABNORMAL HIGH (ref 0.61–1.24)
GFR calc non Af Amer: 35 mL/min — ABNORMAL LOW (ref >60)
GFR, EST AFRICAN AMERICAN: 41 mL/min — AB (ref >60)
GLUCOSE: 116 mg/dL — AB (ref 65–99)
POTASSIUM: 4.6 mmol/L (ref 3.5–5.1)
SODIUM: 139 mmol/L (ref 135–145)
Total Bilirubin: 0.5 mg/dL (ref 0.3–1.2)
Total Protein: 6.9 g/dL (ref 6.5–8.1)

## 2015-08-10 LAB — PROTIME-INR
INR: 1.12 (ref 0.00–1.49)
Prothrombin Time: 14.6 s (ref 11.6–15.2)

## 2015-08-10 LAB — CBC
HEMATOCRIT: 22.3 % — AB (ref 39.0–52.0)
HEMATOCRIT: 22.9 % — AB (ref 39.0–52.0)
HEMOGLOBIN: 7 g/dL — AB (ref 13.0–17.0)
HEMOGLOBIN: 7.2 g/dL — AB (ref 13.0–17.0)
MCH: 29.9 pg (ref 26.0–34.0)
MCH: 28.7 pg (ref 26.0–34.0)
MCHC: 31.4 g/dL (ref 30.0–36.0)
MCHC: 31.4 g/dL (ref 30.0–36.0)
MCV: 95.3 fL (ref 78.0–100.0)
MCV: 91.2 fL (ref 78.0–100.0)
PLATELETS: 624 10*3/uL — AB (ref 150–400)
Platelets: 535 10*3/uL — ABNORMAL HIGH (ref 150–400)
RBC: 2.34 MIL/uL — AB (ref 4.22–5.81)
RBC: 2.51 MIL/uL — ABNORMAL LOW (ref 4.22–5.81)
RDW: 15.1 % (ref 11.5–15.5)
RDW: 18.4 % — ABNORMAL HIGH (ref 11.5–15.5)
WBC: 10.2 10*3/uL (ref 4.0–10.5)
WBC: 9.9 10*3/uL (ref 4.0–10.5)

## 2015-08-10 LAB — VITAMIN B12: Vitamin B-12: 3405 pg/mL — ABNORMAL HIGH (ref 180–914)

## 2015-08-10 LAB — URINALYSIS, ROUTINE W REFLEX MICROSCOPIC
Bilirubin Urine: NEGATIVE
GLUCOSE, UA: NEGATIVE mg/dL
HGB URINE DIPSTICK: NEGATIVE
KETONES UR: 15 mg/dL — AB
Leukocytes, UA: NEGATIVE
Nitrite: NEGATIVE
PH: 5.5 (ref 5.0–8.0)
PROTEIN: NEGATIVE mg/dL
Specific Gravity, Urine: 1.03 (ref 1.005–1.030)

## 2015-08-10 LAB — LIPASE, BLOOD: LIPASE: 31 U/L (ref 11–51)

## 2015-08-10 LAB — PREPARE RBC (CROSSMATCH)

## 2015-08-10 LAB — POC OCCULT BLOOD, ED: Fecal Occult Bld: NEGATIVE

## 2015-08-10 LAB — IRON AND TIBC
Iron: 8 ug/dL — ABNORMAL LOW (ref 45–182)
Saturation Ratios: 3 % — ABNORMAL LOW (ref 17.9–39.5)
TIBC: 277 ug/dL (ref 250–450)
UIBC: 269 ug/dL

## 2015-08-10 LAB — RETICULOCYTES
RBC.: 2.05 MIL/uL — ABNORMAL LOW (ref 4.22–5.81)
Retic Count, Absolute: 69.7 K/uL (ref 19.0–186.0)
Retic Ct Pct: 3.4 % — ABNORMAL HIGH (ref 0.4–3.1)

## 2015-08-10 LAB — MAGNESIUM: MAGNESIUM: 1.8 mg/dL (ref 1.7–2.4)

## 2015-08-10 LAB — FOLATE: Folate: 23.3 ng/mL

## 2015-08-10 LAB — FERRITIN: Ferritin: 12 ng/mL — ABNORMAL LOW (ref 24–336)

## 2015-08-10 LAB — ABO/RH: ABO/RH(D): A POS

## 2015-08-10 MED ORDER — SODIUM CHLORIDE 0.9 % IJ SOLN
3.0000 mL | Freq: Two times a day (BID) | INTRAMUSCULAR | Status: DC
  Administered 2015-08-10 – 2015-08-23 (×11): 3 mL via INTRAVENOUS

## 2015-08-10 MED ORDER — ACETAMINOPHEN 650 MG RE SUPP: 650.0000 mg | Freq: Four times a day (QID) | RECTAL | Status: DC | PRN

## 2015-08-10 MED ORDER — IOHEXOL 300 MG/ML  SOLN
80.0000 mL | Freq: Once | INTRAMUSCULAR | Status: AC | PRN
  Administered 2015-08-10: 80 mL via INTRAVENOUS

## 2015-08-10 MED ORDER — ACETAMINOPHEN 325 MG PO TABS: 650.0000 mg | ORAL_TABLET | Freq: Once | ORAL | Status: DC

## 2015-08-10 MED ORDER — PANTOPRAZOLE SODIUM 40 MG IV SOLR
40.0000 mg | Freq: Once | INTRAVENOUS | Status: AC
  Administered 2015-08-10: 40 mg via INTRAVENOUS
  Filled 2015-08-10: qty 40

## 2015-08-10 MED ORDER — ACETAMINOPHEN 325 MG PO TABS
650.0000 mg | ORAL_TABLET | Freq: Four times a day (QID) | ORAL | Status: DC | PRN
  Administered 2015-08-23: 650 mg via ORAL
  Filled 2015-08-10: qty 2

## 2015-08-10 MED ORDER — LEVOTHYROXINE SODIUM 150 MCG PO TABS
150.0000 ug | ORAL_TABLET | Freq: Every day | ORAL | Status: DC
  Administered 2015-08-11 – 2015-08-16 (×6): 150 ug via ORAL
  Filled 2015-08-10 (×6): qty 1

## 2015-08-10 MED ORDER — PANTOPRAZOLE SODIUM 40 MG IV SOLR
40.0000 mg | Freq: Two times a day (BID) | INTRAVENOUS | Status: DC
  Administered 2015-08-11 – 2015-08-13 (×5): 40 mg via INTRAVENOUS
  Filled 2015-08-10 (×5): qty 40

## 2015-08-10 MED ORDER — SODIUM CHLORIDE 0.9 % IV BOLUS (SEPSIS)
1000.0000 mL | Freq: Once | INTRAVENOUS | Status: AC
  Administered 2015-08-10: 1000 mL via INTRAVENOUS

## 2015-08-10 MED ORDER — HYDROCODONE-ACETAMINOPHEN 5-325 MG PO TABS
1.0000 | ORAL_TABLET | ORAL | Status: DC | PRN
  Administered 2015-08-21: 1 via ORAL
  Filled 2015-08-10: qty 2
  Filled 2015-08-10: qty 1

## 2015-08-10 MED ORDER — CHLORHEXIDINE GLUCONATE 0.12 % MT SOLN
15.0000 mL | Freq: Two times a day (BID) | OROMUCOSAL | Status: DC
  Administered 2015-08-11 – 2015-08-24 (×19): 15 mL via OROMUCOSAL
  Filled 2015-08-10 (×16): qty 15

## 2015-08-10 MED ORDER — CETYLPYRIDINIUM CHLORIDE 0.05 % MT LIQD
7.0000 mL | Freq: Two times a day (BID) | OROMUCOSAL | Status: DC
  Administered 2015-08-11 – 2015-08-23 (×21): 7 mL via OROMUCOSAL

## 2015-08-10 NOTE — ED Notes (Signed)
Pt came from PCP after being seen for abd pain x1 week. And was told his hbg was 4.9 today. Pt has history of stomach ulcers. Pt states ,"belly just burns". Pt denies any dark stools or n/v.

## 2015-08-10 NOTE — Consult Note (Signed)
Reason for Consult:Recurrent ulcer Referring Physician: Opyd  NAKEEM MURNANE is an 64 y.o. male.  HPI: Patient is a 64 year old male that we are asked to consult on for recurrent ulcer disease.  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 In 2007 Dr. Lucia Gaskins operated on him for similar problems. He ended up having a truncal vagotomy, a retrocolic Roux-en-Y gastrojejunostomy, and a Stamm gastrostomy tube. Dr. Lucia Gaskins reports that his duodenum and pyloric channel were so abnormal that it was difficult to assess where he would be able to divide if he did an antrectomy. This was felt to not be safe.  He is here with approximately 10 days of abdominal pain and weakness. He started having anorexia with intermittent severe abdominal pain.  They deny nausea or vomiting and he has been having BMs.  He lives with his mother and his sister in law is currently with him who provides his history.  She states that he has not had any hematemesis or blood in his stool that his mother has mentioned.  He sees Dr. Collene Mares and she treats his PUD.  He saw his primary care physician yesterday and was found to have a hemoglobin of 4. He was sent to the emergency department. In the ER, he was tachycardic and hemoglobin was found to be 7. He did get 2 units of packed red cells and a CT scan. Patient has had a Hemoccult negative stool here in the ED.  We have been asked to see him for his CT scan findings.   PMH Patient Active Problem List   Diagnosis Date Noted  . Hypothyroidism 08/11/2015  . Acute kidney injury superimposed on chronic kidney disease ([place]) 08/11/2015  . PUD (peptic ulcer disease) 08/11/2015  . 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 Blood loss anemia 08/10/2015  . Trisomy 21 08/10/2015    PSH: EGD with truncal vagotomy, retrocolic Roux-en-Y gastrojejunostomy, Stamm gastrostomy  Family history non contributory              Social History:  reports that he has never smoked. He does not have  any smokeless tobacco history on file. He reports that he does not drink alcohol. His drug history is not on file.  Allergies:  Allergies  Allergen Reactions  . Penicillins Anaphylaxis    Has patient had a PCN reaction causing immediate rash, facial/tongue/throat swelling, SOB or lightheadedness with hypotension: Yes Has patient had a PCN reaction causing severe rash involving mucus membranes or skin necrosis: No Has patient had a PCN reaction that required hospitalization No Has patient had a PCN reaction occurring within the last 10 years: No If all of the above answers are "NO", then may proceed with Cephalosporin use.  . Sulfa Antibiotics     Medications: Prior to Admission:  nexium levothyroxine  Results for orders placed or performed during the hospital encounter of 08/10/15 (from the past 48 hour(s))  Lipase, blood     Status: None   Collection Time: 08/10/15  1:59 PM  Result Value Ref Range   Lipase 31 11 - 51 U/L  Comprehensive metabolic panel     Status: Abnormal   Collection Time: 08/10/15  1:59 PM  Result Value Ref Range   Sodium 139 135 - 145 mmol/L   Potassium 4.6 3.5 - 5.1 mmol/L   Chloride 104 101 - 111 mmol/L   CO2 27 22 - 32 mmol/L   Glucose, Bld 116 (H) 65 - 99 mg/dL   BUN 26 (  H) 6 - 20 mg/dL   Creatinine, Ser 1.94 (H) 0.61 - 1.24 mg/dL   Calcium 8.9 8.9 - 10.3 mg/dL   Total Protein 6.9 6.5 - 8.1 g/dL   Albumin 2.3 (L) 3.5 - 5.0 g/dL   AST 20 15 - 41 U/L   ALT 12 (L) 17 - 63 U/L   Alkaline Phosphatase 54 38 - 126 U/L   Total Bilirubin 0.5 0.3 - 1.2 mg/dL   GFR calc non Af Amer 35 (L) >60 mL/min   GFR calc Af Amer 41 (L) >60 mL/min    Comment: (NOTE) The eGFR has been calculated using the CKD EPI equation. This calculation has not been validated in all clinical situations. eGFR's persistently <60 mL/min signify possible Chronic Kidney Disease.    Anion gap 8 5 - 15  CBC     Status: Abnormal   Collection Time: 08/10/15  1:59 PM  Result Value Ref  Range   WBC 10.2 4.0 - 10.5 K/uL   RBC 2.34 (L) 4.22 - 5.81 MIL/uL   Hemoglobin 7.0 (L) 13.0 - 17.0 g/dL   HCT 22.3 (L) 39.0 - 52.0 %   MCV 95.3 78.0 - 100.0 fL   MCH 29.9 26.0 - 34.0 pg   MCHC 31.4 30.0 - 36.0 g/dL   RDW 15.1 11.5 - 15.5 %   Platelets 624 (H) 150 - 400 K/uL  Type and screen [place]     Status: None (Preliminary result)   Collection Time: 08/10/15  5:35 PM  Result Value Ref Range   ABO/RH(D) A POS    Antibody Screen NEG    Sample Expiration 08/13/2015    Unit Number [id number]    Blood Component Type RED CELLS,LR    Unit division 00    Status of Unit ISSUED    Transfusion Status OK TO TRANSFUSE    Crossmatch Result Compatible    Unit Number [id number]    Blood Component Type RED CELLS,LR    Unit division 00    Status of Unit ISSUED    Transfusion Status OK TO TRANSFUSE    Crossmatch Result Compatible   ABO/Rh     Status: None   Collection Time: 08/10/15  5:35 PM  Result Value Ref Range   ABO/RH(D) A POS   Prepare RBC     Status: None   Collection Time: 08/10/15  6:00 PM  Result Value Ref Range   Order Confirmation ORDER PROCESSED BY BLOOD BANK   POC occult blood, ED Provider will collect     Status: None   Collection Time: 08/10/15  6:02 PM  Result Value Ref Range   Fecal Occult Bld NEGATIVE NEGATIVE  Vitamin B12     Status: Abnormal   Collection Time: 08/10/15  7:00 PM  Result Value Ref Range   Vitamin B-12 3405 (H) 180 - 914 pg/mL    Comment: (NOTE) This assay is not validated for testing neonatal or myeloproliferative syndrome specimens for Vitamin B12 levels.   Folate     Status: None   Collection Time: 08/10/15  7:00 PM  Result Value Ref Range   Folate 23.3 >5.9 ng/mL  Iron and TIBC     Status: Abnormal   Collection Time: 08/10/15  7:00 PM  Result Value Ref Range   Iron 8 (L) 45 - 182 ug/dL   TIBC 277 250 - 450 ug/dL   Saturation Ratios 3 (L) 17.9 - 39.5 %   UIBC 269 ug/dL  Ferritin  Status: Abnormal    Collection Time: 08/10/15  7:00 PM  Result Value Ref Range   Ferritin 12 (L) 24 - 336 ng/mL  Reticulocytes     Status: Abnormal   Collection Time: 08/10/15  7:00 PM  Result Value Ref Range   Retic Ct Pct 3.4 (H) 0.4 - 3.1 %   RBC. 2.05 (L) 4.22 - 5.81 MIL/uL   Retic Count, Manual 69.7 19.0 - 186.0 K/uL  Protime-INR     Status: None   Collection Time: 08/10/15  7:00 PM  Result Value Ref Range   Prothrombin Time 14.6 11.6 - 15.2 seconds   INR 1.12 0.00 - 1.49  Prepare RBC     Status: None   Collection Time: 08/10/15  9:28 PM  Result Value Ref Range   Order Confirmation ORDER PROCESSED BY BLOOD BANK   Urinalysis, Routine w reflex microscopic (not at [facility])     Status: Abnormal   Collection Time: 08/10/15 10:12 PM  Result Value Ref Range   Color, Urine YELLOW YELLOW   APPearance CLEAR CLEAR   Specific Gravity, Urine 1.030 1.005 - 1.030   pH 5.5 5.0 - 8.0   Glucose, UA NEGATIVE NEGATIVE mg/dL   Hgb urine dipstick NEGATIVE NEGATIVE   Bilirubin Urine NEGATIVE NEGATIVE   Ketones, ur 15 (A) NEGATIVE mg/dL   Protein, ur NEGATIVE NEGATIVE mg/dL   Nitrite NEGATIVE NEGATIVE   Leukocytes, UA NEGATIVE NEGATIVE    Comment: MICROSCOPIC NOT DONE ON URINES WITH NEGATIVE PROTEIN, BLOOD, LEUKOCYTES, NITRITE, OR GLUCOSE <1000 mg/dL.  Magnesium     Status: None   Collection Time: 08/10/15 11:04 PM  Result Value Ref Range   Magnesium 1.8 1.7 - 2.4 mg/dL  CBC     Status: Abnormal   Collection Time: 08/10/15 11:04 PM  Result Value Ref Range   WBC 9.9 4.0 - 10.5 K/uL   RBC 2.51 (L) 4.22 - 5.81 MIL/uL   Hemoglobin 7.2 (L) 13.0 - 17.0 g/dL   HCT 22.9 (L) 39.0 - 52.0 %   MCV 91.2 78.0 - 100.0 fL   MCH 28.7 26.0 - 34.0 pg   MCHC 31.4 30.0 - 36.0 g/dL   RDW 18.4 (H) 11.5 - 15.5 %   Platelets 535 (H) 150 - 400 K/uL  TSH     Status: Abnormal   Collection Time: 08/10/15 11:04 PM  Result Value Ref Range   TSH 8.325 (H) 0.350 - 4.500 uIU/mL  CBC     Status: Abnormal   Collection Time: 08/11/15  1:26  AM  Result Value Ref Range   WBC 9.6 4.0 - 10.5 K/uL   RBC 2.79 (L) 4.22 - 5.81 MIL/uL   Hemoglobin 8.0 (L) 13.0 - 17.0 g/dL   HCT 25.1 (L) 39.0 - 52.0 %   MCV 90.0 78.0 - 100.0 fL   MCH 28.7 26.0 - 34.0 pg   MCHC 31.9 30.0 - 36.0 g/dL   RDW 17.7 (H) 11.5 - 15.5 %   Platelets 497 (H) 150 - 400 K/uL  Brain natriuretic peptide     Status: None   Collection Time: 08/11/15  3:15 AM  Result Value Ref Range   B Natriuretic Peptide 80.1 0.0 - 100.0 pg/mL  CBC     Status: Abnormal   Collection Time: 08/11/15  6:00 AM  Result Value Ref Range   WBC 8.9 4.0 - 10.5 K/uL   RBC 2.80 (L) 4.22 - 5.81 MIL/uL   Hemoglobin 8.4 (L) 13.0 - 17.0 g/dL   HCT  24.9 (L) 39.0 - 52.0 %   MCV 88.9 78.0 - 100.0 fL   MCH 30.0 26.0 - 34.0 pg   MCHC 33.7 30.0 - 36.0 g/dL   RDW 18.2 (H) 11.5 - 15.5 %   Platelets 445 (H) 150 - 400 K/uL  Comprehensive metabolic panel     Status: Abnormal   Collection Time: 08/11/15  6:00 AM  Result Value Ref Range   Sodium 136 135 - 145 mmol/L   Potassium 4.2 3.5 - 5.1 mmol/L   Chloride 106 101 - 111 mmol/L   CO2 22 22 - 32 mmol/L   Glucose, Bld 91 65 - 99 mg/dL   BUN 20 6 - 20 mg/dL   Creatinine, Ser 1.65 (H) 0.61 - 1.24 mg/dL   Calcium 7.8 (L) 8.9 - 10.3 mg/dL   Total Protein 5.5 (L) 6.5 - 8.1 g/dL   Albumin 1.8 (L) 3.5 - 5.0 g/dL   AST 19 15 - 41 U/L   ALT 10 (L) 17 - 63 U/L   Alkaline Phosphatase 44 38 - 126 U/L   Total Bilirubin 0.8 0.3 - 1.2 mg/dL   GFR calc non Af Amer 43 (L) >60 mL/min   GFR calc Af Amer 50 (L) >60 mL/min    Comment: (NOTE) The eGFR has been calculated using the CKD EPI equation. This calculation has not been validated in all clinical situations. eGFR's persistently <60 mL/min signify possible Chronic Kidney Disease.    Anion gap 8 5 - 15    Ct Abdomen Pelvis W Contrast  08/10/2015  CLINICAL DATA:  History of stomach ulcers. Low hemoglobin. Abdominal pain for 1 week. EXAM: CT ABDOMEN AND PELVIS WITH CONTRAST TECHNIQUE: Multidetector CT  imaging of the abdomen and pelvis was performed using the standard protocol following bolus administration of intravenous contrast. CONTRAST:  58m OMNIPAQUE IOHEXOL 300 MG/ML  SOLN COMPARISON:  01/18/2006. FINDINGS: Lower chest: No pleural effusion identified. The lung bases appear clear. Hepatobiliary: There is no focal liver abnormality. Multiple stones are identified within the gallbladder. The largest measures 1.9 cm. No biliary dilatation. Pancreas: The pancreas is unremarkable. Spleen: Negative. Adrenals/Urinary Tract: The adrenal glands are normal. Unremarkable appearance of the kidneys. The urinary bladder appears normal. Stomach/Bowel: There are postsurgical changes previous Roux-en-Y gastrojejunostomy. There is moderate distension of the gastric lumen which is worrisome for gastric outlet obstruction. At the gastro jejunal anastomosis there is abnormal mucosal enhancement with the appearance of significant luminal narrowing. The Roux limb is diffusely distended with diffuse mucosal enhancement and marked inflammation and edema of the wall. The mid and distal small bowel loops are unremarkable. The proximal colon is normal. There is abnormal wall thickening involving transverse colon as it approximates the Roux limb compatible with secondary inflammation. No definite evidence for bowel perforation. No abscess noted at this time. Vascular/Lymphatic: Normal appearance of the abdominal aorta. No enlarged retroperitoneal or mesenteric adenopathy. No enlarged pelvic or inguinal lymph nodes. Reproductive: Prostate gland and seminal vesicles are unremarkable. Other: There is no free intraperitoneal air identified. There is no free fluid or abnormal fluid collections identified. Musculoskeletal: No aggressive lytic or sclerotic bone lesions. There is multi level degenerative disc disease identified within the lower thoracic and throughout the lumbar spine. IMPRESSION: 1. Postoperative appearance of the upper GI  tract compatible with previous Roux-en-Y gastrojejunostomy. The Roux limb appears diffusely abnormal with marked wall thickening and inflammation as well as luminal distention. The distension of the stomach and Roux limb are worrisome for gastric outlet obstruction  at the level of the gastrojejunostomy as well as potential obstruction at the jejunal enteric anastomosis. Etiology unknown but may be related to peptic ulcer disease. Underlying ischemia cannot be excluded. 2. No evidence for bowel perforation or abscess formation. No free intraperitoneal air identified. Electronically Signed   By: Kerby Moors M.D.   On: 08/10/2015 18:51    ROS please see HPI, otherwise unable to obtain from the patient as he will not talk to me right now Blood pressure 103/65, pulse 81, temperature 97.6 F (36.4 C), temperature source Oral, resp. rate 18, weight 52.209 kg (115 lb 1.6 oz), SpO2 99 %. Physical Exam  General: pleasant, WD, WN white male who is laying in bed in NAD HEENT: head is normocephalic, atraumatic.  Sclera are noninjected.  PERRL.  Ears and nose without any masses or lesions.  Mouth is pink and moist Heart: regular, rate, and rhythm.  Normal s1,s2. No obvious murmurs, gallops, or rubs noted.  Palpable radial and pedal pulses bilaterally Lungs: CTAB, no wheezes, rhonchi, or rales noted.  Respiratory effort nonlabored Abd: soft, tender to palpation greatest in upper abdomen, ND, +BS, no masses or organomegaly.  He does has a reducible incisional hernia noted along his midline incision. MS: all 4 extremities are symmetrical with no cyanosis, clubbing, or edema. Skin: warm and dry with no masses, lesions, or rashes Psych: A&Ox3 with an appropriate affect.   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 -we will follow along and make recommendations when more information is available from EGD.   Khamarion Bjelland E 08/11/2015, 7:44 AM

## 2015-08-10 NOTE — ED Notes (Signed)
Attempted to call report

## 2015-08-10 NOTE — H&P (Signed)
[facility] History and Physical  Fielding Lagrone Lape [id number] DOB: 1951/01/14 DOA: 08/10/2015  Referring physician: ED physician PCP: Donnie Coffin, MD  Specialists:   Chief Complaint:   HPI: Isaac Ramirez is a 64 y.o. 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 The patient's mother and sister are at the bedside and assist with the history. Per their report, Isaac Ramirez seemed to be in his usual state of fairly good health until one week ago when his appetite became very poor, he began complaining of severe intermittent abdominal pain, and was noted to be generally very weak. He saw his primary care physician earlier in the day for evaluation of these complaints and stool was reportedly Hemoccult negative in the clinic. He was sent to the outpatient lab where hemoglobin returned at a value of 4. He was then directed to the emergency room.  In ED, patient was found to have sinus tachycardia, borderline low blood pressure, and a hemoglobin of 7.0. 2 units of packed red blood cells were ordered for immediate administration and the patient was sent to the CT for further evaluation. Contrast-enhanced CT reveals diffuse wall thickness and dilation of the Roux limb suggestive of gastric outlet obstruction. There was no evidence of perforation or hemoperitoneum on the imaging study. Dr. Barry Dienes of surgery was consulted from the emergency department and is kindly agreed to see the patient on the floor. Rectal exam in the ED was unremarkable and again, stool was Hemoccult negative. Blood pressure and heart rate improved as the first unit of packed red blood cells was going in and the patient was admitted to telemetry unit for ongoing evaluation and  management of symptomatic anemia, likely from peptic ulcer, with gastric outlet obstruction.  Where does patient live?   At home   Can patient participate in ADLs?   Some   Review of Systems:   General: no fevers, chills, sweats, weight change, poor appetite, fatigue HEENT: no blurry vision, hearing changes or sore throat Pulm: no dyspnea, cough, or wheeze CV: no chest pain or palpitations Abd: no nausea or vomiting, abdominal pain, no diarrhea, or constipation GU: no dysuria, hematuria, increased urinary frequency, or urgency  Ext: no leg edema Neuro: no focal weakness, numbness, or tingling, no vision change or hearing loss Skin: no rash, no wounds MSK: No muscle spasm, no deformity, no red, hot, or swollen joint Heme: No easy bruising or bleeding Travel history: No recent long distant travel    Allergy:  Allergies  Allergen Reactions  . Penicillins Anaphylaxis    Has patient had a PCN reaction causing immediate rash, facial/tongue/throat swelling, SOB or lightheadedness with hypotension: Yes Has patient had a PCN reaction causing severe rash involving mucus membranes or skin necrosis: No Has patient had a PCN reaction that required hospitalization No Has patient had a PCN reaction occurring within the last 10 years: No If all of the above answers are "NO", then may proceed with Cephalosporin use.  . Sulfa Antibiotics     History reviewed. No pertinent past medical history.  History reviewed. No pertinent past surgical history.  Social History:  reports that he has never smoked. He does not have any smokeless tobacco history on file. He reports that he does not drink alcohol.  His drug history is not on file.  Family History: No family history on file.   Prior to Admission medications   Medication Sig Start Date End Date Taking? Authorizing Provider  esomeprazole (NEXIUM) 40 MG capsule Take 40 mg by mouth 2 (two) times daily. 07/05/15  Yes Historical Provider, MD   levothyroxine (SYNTHROID, LEVOTHROID) 150 MCG tablet Take 150 mcg by mouth daily before breakfast. 07/31/15  Yes Historical Provider, MD    Physical Exam: Filed Vitals:   08/10/15 1957 08/10/15 1958 08/10/15 1959 08/10/15 2010  BP:      Pulse: 98   102  Temp:    100 F (37.8 C)  TempSrc:    Oral  Resp: 20 19 18 17   SpO2: 100%   100%   General: Not in acute distress, trisomy 21 facies HEENT:       Eyes: PERRL, EOMI, no scleral icterus, there is conjunctival pallor.       ENT: No discharge from the ears or nose, no pharyngeal ulcers, petechiae or exudate, no tonsillar enlargement.        Neck: No JVD, no bruit, no appreciable mass Heme: No cervical adenopathy, no pallor Cardiac: S1/S2, rate ~110 and regular, grade 3 SEM at RSB, no gallops or rubs. Pulm: Good air movement bilaterally. No rales, wheezing, rhonchi or rubs. Abd: Soft, nondistended, mildly tender in lower quadrants, no rebound pain or gaurding, no mass or organomegaly, BS present. Ext: No LE edema bilaterally. 2+DP/PT pulse bilaterally. Musculoskeletal: No gross deformity, no red, hot, swollen joints, no limitation in ROM  Skin: No rashes or wounds on exposed surfaces  Neuro: Alert, oriented to person and place, cranial nerves II-XII grossly intact. No focal findings Psych: Patient is not overtly psychotic.  Labs on Admission:  Basic Metabolic Panel:  Recent Labs Lab 08/10/15 1359  NA 139  K 4.6  CL 104  CO2 27  GLUCOSE 116*  BUN 26*  CREATININE 1.94*  CALCIUM 8.9   Liver Function Tests:  Recent Labs Lab 08/10/15 1359  AST 20  ALT 12*  ALKPHOS 54  BILITOT 0.5  PROT 6.9  ALBUMIN 2.3*    Recent Labs Lab 08/10/15 1359  LIPASE 31   No results for input(s): AMMONIA in the last 168 hours. CBC:  Recent Labs Lab 08/10/15 1359  WBC 10.2  HGB 7.0*  HCT 22.3*  MCV 95.3  PLT 624*   Cardiac Enzymes: No results for input(s): CKTOTAL, CKMB, CKMBINDEX, TROPONINI in the last 168 hours.  BNP  (last 3 results) No results for input(s): BNP in the last 8760 hours.  ProBNP (last 3 results) No results for input(s): PROBNP in the last 8760 hours.  CBG: No results for input(s): GLUCAP in the last 168 hours.  Radiological Exams on Admission: Ct Abdomen Pelvis W Contrast  08/10/2015  CLINICAL DATA:  History of stomach ulcers. Low hemoglobin. Abdominal pain for 1 week. EXAM: CT ABDOMEN AND PELVIS WITH CONTRAST TECHNIQUE: Multidetector CT imaging of the abdomen and pelvis was performed using the standard protocol following bolus administration of intravenous contrast. CONTRAST:  59mL OMNIPAQUE IOHEXOL 300 MG/ML  SOLN COMPARISON:  01/18/2006. FINDINGS: Lower chest: No pleural effusion identified. The lung bases appear clear. Hepatobiliary: There is no focal liver abnormality. Multiple stones are identified within the gallbladder. The largest measures 1.9 cm. No biliary dilatation. Pancreas: The pancreas is unremarkable. Spleen: Negative. Adrenals/Urinary Tract: The adrenal glands are normal. Unremarkable appearance of the kidneys. The urinary bladder appears normal. Stomach/Bowel: There are postsurgical  changes previous Roux-en-Y gastrojejunostomy. There is moderate distension of the gastric lumen which is worrisome for gastric outlet obstruction. At the gastro jejunal anastomosis there is abnormal mucosal enhancement with the appearance of significant luminal narrowing. The Roux limb is diffusely distended with diffuse mucosal enhancement and marked inflammation and edema of the wall. The mid and distal small bowel loops are unremarkable. The proximal colon is normal. There is abnormal wall thickening involving transverse colon as it approximates the Roux limb compatible with secondary inflammation. No definite evidence for bowel perforation. No abscess noted at this time. Vascular/Lymphatic: Normal appearance of the abdominal aorta. No enlarged retroperitoneal or mesenteric adenopathy. No enlarged  pelvic or inguinal lymph nodes. Reproductive: Prostate gland and seminal vesicles are unremarkable. Other: There is no free intraperitoneal air identified. There is no free fluid or abnormal fluid collections identified. Musculoskeletal: No aggressive lytic or sclerotic bone lesions. There is multi level degenerative disc disease identified within the lower thoracic and throughout the lumbar spine. IMPRESSION: 1. Postoperative appearance of the upper GI tract compatible with previous Roux-en-Y gastrojejunostomy. The Roux limb appears diffusely abnormal with marked wall thickening and inflammation as well as luminal distention. The distension of the stomach and Roux limb are worrisome for gastric outlet obstruction at the level of the gastrojejunostomy as well as potential obstruction at the jejunal enteric anastomosis. Etiology unknown but may be related to peptic ulcer disease. Underlying ischemia cannot be excluded. 2. No evidence for bowel perforation or abscess formation. No free intraperitoneal air identified. Electronically Signed   By: Kerby Moors M.D.   On: 08/10/2015 18:51    EKG: Not done in ED, will obtain as appropriate   Assessment/Plan  1. GI blood-loss anemia, symptomatic  Hemoglobin was reportedly 4 earlier on the day of admission in the outpatient setting. Upon presentation to the ED, hemoglobin returned at a value of 7.0. 2 units of packed red blood cells were transfused and there was one gram rise in hemoglobin. Patient was in sinus tachycardia upon presentation, and blood pressure was in the [id number] range systolic. These parameters improved with the administration of the blood products. The patient had been symptomatic with fatigue and loss of appetite, but denied chest pain or palpitations. Patient has a long history of severe PUD as described below and given the CT findings above, this is the likely source of blood loss. In additional unit of blood is on hold and the blood bank and  serial H/H will be followed overnight. He will be monitored on telemetry. Given his significant anemia and therefore, diminished oxygen carrying capacity, we will keep O2 saturation up close to 100 with nasal cannula. We will monitor fluid status closely.  2. PUD Patient has a long history of severe peptic ulcer disease and had a Roux-en-Y and vagotomy performed approximately 10 years ago for this. Dr. Barry Dienes was consulted from the emergency department and is on the case. Patient is on Protonix 40 mg IV twice a day and will be kept nothing by mouth for potential EGD in the morning.  3. Hypothyroid  Mr. Smeby is treated with levothyroxine for hypothyroidism. TSH is elevated to 8.3, suggesting that his dose may need to be increased by his primary care physician. He does not seem to be symptomatic from this standpoint.  4. Kidney disease of unknown chronicity  Patient presents with a BUN/creatinine of 26/1.94, which corresponds to a GFR of roughly 38. I see no record of prior chemistry panels in our system, but I  suspect some of this may be acute given his loss of appetite and clinical dehydration upon arrival in the ED. Patient underwent a contrast enhanced CT and his renal function and fluid status will be followed closely with attempts to minimize any nephrotoxic agents or BP fluctuations.   DVT ppx: SCDs only  Code Status: Full code Family Communication:  Yes, patient's mother and sister at bed side Disposition Plan: Admit to inpatient   Date of Service 08/10/2015    Ilene Qua Miral Hoopes [facility] Pager [phone]  If 7PM-7AM, please contact night-coverage [email or web address] Password [email or web address] 08/10/2015, 9:08 PM

## 2015-08-11 ENCOUNTER — Encounter (HOSPITAL_COMMUNITY)
Admission: EM | Disposition: A | Discharge status: Home / Self Care | Payer: Medicare Other | Source: Home / Self Care | Attending: Internal Medicine

## 2015-08-11 ENCOUNTER — Encounter (HOSPITAL_COMMUNITY): Payer: Self-pay | Admitting: Family Medicine

## 2015-08-11 DIAGNOSIS — K279 Peptic ulcer, site unspecified, unspecified as acute or chronic, without hemorrhage or perforation: Secondary | ICD-10-CM | POA: Diagnosis present

## 2015-08-11 DIAGNOSIS — D500 Iron deficiency anemia secondary to blood loss (chronic): Secondary | ICD-10-CM

## 2015-08-11 DIAGNOSIS — E039 Hypothyroidism, unspecified: Secondary | ICD-10-CM | POA: Diagnosis present

## 2015-08-11 HISTORY — PX: ESOPHAGOGASTRODUODENOSCOPY: SHX5428

## 2015-08-11 LAB — CBC
HCT: 25.1 % — ABNORMAL LOW (ref 39.0–52.0)
HEMATOCRIT: 24.9 % — AB (ref 39.0–52.0)
HEMATOCRIT: 28.1 % — AB (ref 39.0–52.0)
HEMOGLOBIN: 8 g/dL — AB (ref 13.0–17.0)
HEMOGLOBIN: 9.4 g/dL — AB (ref 13.0–17.0)
Hemoglobin: 8.4 g/dL — ABNORMAL LOW (ref 13.0–17.0)
MCH: 28.7 pg (ref 26.0–34.0)
MCH: 30 pg (ref 26.0–34.0)
MCH: 29.9 pg (ref 26.0–34.0)
MCHC: 31.9 g/dL (ref 30.0–36.0)
MCHC: 33.7 g/dL (ref 30.0–36.0)
MCHC: 33.5 g/dL (ref 30.0–36.0)
MCV: 90 fL (ref 78.0–100.0)
MCV: 88.9 fL (ref 78.0–100.0)
MCV: 89.5 fL (ref 78.0–100.0)
PLATELETS: 445 10*3/uL — AB (ref 150–400)
Platelets: 497 10*3/uL — ABNORMAL HIGH (ref 150–400)
Platelets: 504 10*3/uL — ABNORMAL HIGH (ref 150–400)
RBC: 2.79 MIL/uL — AB (ref 4.22–5.81)
RBC: 2.8 MIL/uL — AB (ref 4.22–5.81)
RBC: 3.14 MIL/uL — AB (ref 4.22–5.81)
RDW: 17.7 % — ABNORMAL HIGH (ref 11.5–15.5)
RDW: 18.2 % — ABNORMAL HIGH (ref 11.5–15.5)
RDW: 18.4 % — ABNORMAL HIGH (ref 11.5–15.5)
WBC: 9.6 10*3/uL (ref 4.0–10.5)
WBC: 8.9 10*3/uL (ref 4.0–10.5)
WBC: 9.5 10*3/uL (ref 4.0–10.5)

## 2015-08-11 LAB — TSH: TSH: 8.325 u[IU]/mL — ABNORMAL HIGH (ref 0.350–4.500)

## 2015-08-11 LAB — COMPREHENSIVE METABOLIC PANEL
ALBUMIN: 1.8 g/dL — AB (ref 3.5–5.0)
ALT: 10 U/L — AB (ref 17–63)
AST: 19 U/L (ref 15–41)
Alkaline Phosphatase: 44 U/L (ref 38–126)
Anion gap: 8 (ref 5–15)
BUN: 20 mg/dL (ref 6–20)
CHLORIDE: 106 mmol/L (ref 101–111)
CO2: 22 mmol/L (ref 22–32)
CREATININE: 1.65 mg/dL — AB (ref 0.61–1.24)
Calcium: 7.8 mg/dL — ABNORMAL LOW (ref 8.9–10.3)
GFR calc Af Amer: 50 mL/min — ABNORMAL LOW (ref >60)
GFR, EST NON AFRICAN AMERICAN: 43 mL/min — AB (ref >60)
GLUCOSE: 91 mg/dL (ref 65–99)
Potassium: 4.2 mmol/L (ref 3.5–5.1)
SODIUM: 136 mmol/L (ref 135–145)
TOTAL PROTEIN: 5.5 g/dL — AB (ref 6.5–8.1)
Total Bilirubin: 0.8 mg/dL (ref 0.3–1.2)

## 2015-08-11 LAB — TYPE AND SCREEN
ABO/RH(D): A POS
Antibody Screen: NEGATIVE
Unit division: 0
Unit division: 0

## 2015-08-11 LAB — T4, FREE: Free T4: 0.94 ng/dL (ref 0.61–1.12)

## 2015-08-11 LAB — GLUCOSE, CAPILLARY: GLUCOSE-CAPILLARY: 89 mg/dL (ref 65–99)

## 2015-08-11 LAB — OCCULT BLOOD X 1 CARD TO LAB, STOOL: FECAL OCCULT BLD: POSITIVE — AB

## 2015-08-11 LAB — BRAIN NATRIURETIC PEPTIDE: B Natriuretic Peptide: 80.1 pg/mL (ref 0.0–100.0)

## 2015-08-11 SURGERY — EGD (ESOPHAGOGASTRODUODENOSCOPY)
Anesthesia: Moderate Sedation

## 2015-08-11 MED ORDER — BUTAMBEN-TETRACAINE-BENZOCAINE 2-2-14 % EX AERO
INHALATION_SPRAY | CUTANEOUS | Status: DC | PRN
  Administered 2015-08-11: 1 via TOPICAL

## 2015-08-11 MED ORDER — FENTANYL CITRATE (PF) 100 MCG/2ML IJ SOLN
INTRAMUSCULAR | Status: DC | PRN
  Administered 2015-08-11: 25 ug via INTRAVENOUS

## 2015-08-11 MED ORDER — SODIUM CHLORIDE 0.9 % IV SOLN
INTRAVENOUS | Status: DC
  Administered 2015-08-11 – 2015-08-13 (×5): via INTRAVENOUS

## 2015-08-11 MED ORDER — SUCRALFATE 1 GM/10ML PO SUSP
1.0000 g | Freq: Four times a day (QID) | ORAL | Status: DC
  Administered 2015-08-11 – 2015-08-24 (×50): 1 g via ORAL
  Filled 2015-08-11 (×36): qty 10

## 2015-08-11 MED ORDER — MIDAZOLAM HCL 10 MG/2ML IJ SOLN
INTRAMUSCULAR | Status: DC | PRN
  Administered 2015-08-11: 2 mg via INTRAVENOUS
  Administered 2015-08-11: 1 mg via INTRAVENOUS

## 2015-08-11 MED ORDER — DIPHENHYDRAMINE HCL 50 MG/ML IJ SOLN
INTRAMUSCULAR | Status: AC
  Filled 2015-08-11: qty 1

## 2015-08-11 MED ORDER — FENTANYL CITRATE (PF) 100 MCG/2ML IJ SOLN
INTRAMUSCULAR | Status: AC
  Filled 2015-08-11: qty 2

## 2015-08-11 MED ORDER — MIDAZOLAM HCL 5 MG/ML IJ SOLN
INTRAMUSCULAR | Status: AC
  Filled 2015-08-11: qty 2

## 2015-08-11 NOTE — Progress Notes (Signed)
RT placed PT on CPAP- already set up in room. PT appears to be tolerating well at this time. RN aware and RT has encouraged Sp02 checks- PT just returned from procedure and is very sleepy.

## 2015-08-11 NOTE — Progress Notes (Signed)
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 He was asked to come to ED, and his repeat Hemoglobin was found to be 7. He received 2 units of prbc transfusion and underwent CT of the abdomen. CT showed diffuse wall thickness and dilation of the Roux limb suggestive of gastric outlet obstruction. There was no evidence of perforation or hemoperitoneum on the imaging study. Dr. Barry Dienes of surgery was consulted from the emergency department and saw the patient, recommended Gi consultation and possible EGD today. GI consult from [facility] requested. Of note his hemoccult was negative. He was admitted earlier this am by DR Opyd and please his note in detail .  His repeat hemoglobin this am is 8.4.  Continue to monitor hemoglobin. Anemia panel shows low iron and low ferritin. Will start him on iron supplements once EGD is done.   Hosie Poisson, MD [phone]

## 2015-08-11 NOTE — Progress Notes (Signed)
Patient arrived on unit with two RNs. Patient alert to self and place. Patient oriented to staff, room and staff. Patient's sister in law at the bedside. Patient placed on telemetry monitor, CCMD notified. Skin assessment completed with two RNs, check flowsheets. Patient's IV clean, dry and intact. Orders have been reviewed and implemented.  Call light has been placed within reach and bed alarm has been activated. RN will continue to monitor the patient.  Nena Polio BSN, RN  Phone Number: [phone]

## 2015-08-11 NOTE — Consult Note (Signed)
Reason for Consult: Abnormal CAT scan Referring Physician: Hospital team  Isaac Ramirez is an 64 y.o. male.  HPI: Patient seen and examined and his hospital computer chart was reviewed and his case discussed with the surgeons and he currently has no complaints but has an abnormal CAT scan has had GI symptoms for 1 week as well as a new anemia and a long history of ulcers and gastric surgery  Past Medical History  Diagnosis Date  . PUD (peptic ulcer disease)   . Hypothyroidism   . Trisomy 21     Past Surgical History  Procedure Laterality Date  . Roux-en-y procedure  2007  . Vagotomy      Family History  Problem Relation Age of Onset  . Family history unknown: Yes    Social History:  reports that he has never smoked. He does not have any smokeless tobacco history on file. He reports that he does not drink alcohol. His drug history is not on file.  Allergies:  Allergies  Allergen Reactions  . Penicillins Anaphylaxis    Has patient had a PCN reaction causing immediate rash, facial/tongue/throat swelling, SOB or lightheadedness with hypotension: Yes Has patient had a PCN reaction causing severe rash involving mucus membranes or skin necrosis: No Has patient had a PCN reaction that required hospitalization No Has patient had a PCN reaction occurring within the last 10 years: No If all of the above answers are "NO", then may proceed with Cephalosporin use.  . Sulfa Antibiotics     Medications: I have reviewed the patient's current medications.  Results for orders placed or performed during the hospital encounter of 08/10/15 (from the past 48 hour(s))  Lipase, blood     Status: None   Collection Time: 08/10/15  1:59 PM  Result Value Ref Range   Lipase 31 11 - 51 U/L  Comprehensive metabolic panel     Status: Abnormal   Collection Time: 08/10/15  1:59 PM  Result Value Ref Range   Sodium 139 135 - 145 mmol/L   Potassium 4.6 3.5 - 5.1 mmol/L   Chloride 104 101 - 111  mmol/L   CO2 27 22 - 32 mmol/L   Glucose, Bld 116 (H) 65 - 99 mg/dL   BUN 26 (H) 6 - 20 mg/dL   Creatinine, Ser 1.94 (H) 0.61 - 1.24 mg/dL   Calcium 8.9 8.9 - 10.3 mg/dL   Total Protein 6.9 6.5 - 8.1 g/dL   Albumin 2.3 (L) 3.5 - 5.0 g/dL   AST 20 15 - 41 U/L   ALT 12 (L) 17 - 63 U/L   Alkaline Phosphatase 54 38 - 126 U/L   Total Bilirubin 0.5 0.3 - 1.2 mg/dL   GFR calc non Af Amer 35 (L) >60 mL/min   GFR calc Af Amer 41 (L) >60 mL/min    Comment: (NOTE) The eGFR has been calculated using the CKD EPI equation. This calculation has not been validated in all clinical situations. eGFR's persistently <60 mL/min signify possible Chronic Kidney Disease.    Anion gap 8 5 - 15  CBC     Status: Abnormal   Collection Time: 08/10/15  1:59 PM  Result Value Ref Range   WBC 10.2 4.0 - 10.5 K/uL   RBC 2.34 (L) 4.22 - 5.81 MIL/uL   Hemoglobin 7.0 (L) 13.0 - 17.0 g/dL   HCT 22.3 (L) 39.0 - 52.0 %   MCV 95.3 78.0 - 100.0 fL   MCH 29.9 26.0 - 34.0  pg   MCHC 31.4 30.0 - 36.0 g/dL   RDW 15.1 11.5 - 15.5 %   Platelets 624 (H) 150 - 400 K/uL  Type and screen [place]     Status: None   Collection Time: 08/10/15  5:35 PM  Result Value Ref Range   ABO/RH(D) A POS    Antibody Screen NEG    Sample Expiration 08/13/2015    Unit Number [id number]    Blood Component Type RED CELLS,LR    Unit division 00    Status of Unit ISSUED,FINAL    Transfusion Status OK TO TRANSFUSE    Crossmatch Result Compatible    Unit Number [id number]    Blood Component Type RED CELLS,LR    Unit division 00    Status of Unit ISSUED,FINAL    Transfusion Status OK TO TRANSFUSE    Crossmatch Result Compatible   ABO/Rh     Status: None   Collection Time: 08/10/15  5:35 PM  Result Value Ref Range   ABO/RH(D) A POS   Prepare RBC     Status: None   Collection Time: 08/10/15  6:00 PM  Result Value Ref Range   Order Confirmation ORDER PROCESSED BY BLOOD BANK   POC occult blood, ED Provider will  collect     Status: None   Collection Time: 08/10/15  6:02 PM  Result Value Ref Range   Fecal Occult Bld NEGATIVE NEGATIVE  Vitamin B12     Status: Abnormal   Collection Time: 08/10/15  7:00 PM  Result Value Ref Range   Vitamin B-12 3405 (H) 180 - 914 pg/mL    Comment: (NOTE) This assay is not validated for testing neonatal or myeloproliferative syndrome specimens for Vitamin B12 levels.   Folate     Status: None   Collection Time: 08/10/15  7:00 PM  Result Value Ref Range   Folate 23.3 >5.9 ng/mL  Iron and TIBC     Status: Abnormal   Collection Time: 08/10/15  7:00 PM  Result Value Ref Range   Iron 8 (L) 45 - 182 ug/dL   TIBC 277 250 - 450 ug/dL   Saturation Ratios 3 (L) 17.9 - 39.5 %   UIBC 269 ug/dL  Ferritin     Status: Abnormal   Collection Time: 08/10/15  7:00 PM  Result Value Ref Range   Ferritin 12 (L) 24 - 336 ng/mL  Reticulocytes     Status: Abnormal   Collection Time: 08/10/15  7:00 PM  Result Value Ref Range   Retic Ct Pct 3.4 (H) 0.4 - 3.1 %   RBC. 2.05 (L) 4.22 - 5.81 MIL/uL   Retic Count, Manual 69.7 19.0 - 186.0 K/uL  Protime-INR     Status: None   Collection Time: 08/10/15  7:00 PM  Result Value Ref Range   Prothrombin Time 14.6 11.6 - 15.2 seconds   INR 1.12 0.00 - 1.49  Prepare RBC     Status: None   Collection Time: 08/10/15  9:28 PM  Result Value Ref Range   Order Confirmation ORDER PROCESSED BY BLOOD BANK   Urinalysis, Routine w reflex microscopic (not at [facility])     Status: Abnormal   Collection Time: 08/10/15 10:12 PM  Result Value Ref Range   Color, Urine YELLOW YELLOW   APPearance CLEAR CLEAR   Specific Gravity, Urine 1.030 1.005 - 1.030   pH 5.5 5.0 - 8.0   Glucose, UA NEGATIVE NEGATIVE mg/dL   Hgb urine dipstick NEGATIVE  NEGATIVE   Bilirubin Urine NEGATIVE NEGATIVE   Ketones, ur 15 (A) NEGATIVE mg/dL   Protein, ur NEGATIVE NEGATIVE mg/dL   Nitrite NEGATIVE NEGATIVE   Leukocytes, UA NEGATIVE NEGATIVE    Comment: MICROSCOPIC NOT DONE ON  URINES WITH NEGATIVE PROTEIN, BLOOD, LEUKOCYTES, NITRITE, OR GLUCOSE <1000 mg/dL.  Magnesium     Status: None   Collection Time: 08/10/15 11:04 PM  Result Value Ref Range   Magnesium 1.8 1.7 - 2.4 mg/dL  CBC     Status: Abnormal   Collection Time: 08/10/15 11:04 PM  Result Value Ref Range   WBC 9.9 4.0 - 10.5 K/uL   RBC 2.51 (L) 4.22 - 5.81 MIL/uL   Hemoglobin 7.2 (L) 13.0 - 17.0 g/dL   HCT 22.9 (L) 39.0 - 52.0 %   MCV 91.2 78.0 - 100.0 fL   MCH 28.7 26.0 - 34.0 pg   MCHC 31.4 30.0 - 36.0 g/dL   RDW 18.4 (H) 11.5 - 15.5 %   Platelets 535 (H) 150 - 400 K/uL  TSH     Status: Abnormal   Collection Time: 08/10/15 11:04 PM  Result Value Ref Range   TSH 8.325 (H) 0.350 - 4.500 uIU/mL  CBC     Status: Abnormal   Collection Time: 08/11/15  1:26 AM  Result Value Ref Range   WBC 9.6 4.0 - 10.5 K/uL   RBC 2.79 (L) 4.22 - 5.81 MIL/uL   Hemoglobin 8.0 (L) 13.0 - 17.0 g/dL   HCT 25.1 (L) 39.0 - 52.0 %   MCV 90.0 78.0 - 100.0 fL   MCH 28.7 26.0 - 34.0 pg   MCHC 31.9 30.0 - 36.0 g/dL   RDW 17.7 (H) 11.5 - 15.5 %   Platelets 497 (H) 150 - 400 K/uL  Brain natriuretic peptide     Status: None   Collection Time: 08/11/15  3:15 AM  Result Value Ref Range   B Natriuretic Peptide 80.1 0.0 - 100.0 pg/mL  CBC     Status: Abnormal   Collection Time: 08/11/15  6:00 AM  Result Value Ref Range   WBC 8.9 4.0 - 10.5 K/uL   RBC 2.80 (L) 4.22 - 5.81 MIL/uL   Hemoglobin 8.4 (L) 13.0 - 17.0 g/dL   HCT 24.9 (L) 39.0 - 52.0 %   MCV 88.9 78.0 - 100.0 fL   MCH 30.0 26.0 - 34.0 pg   MCHC 33.7 30.0 - 36.0 g/dL   RDW 18.2 (H) 11.5 - 15.5 %   Platelets 445 (H) 150 - 400 K/uL  Comprehensive metabolic panel     Status: Abnormal   Collection Time: 08/11/15  6:00 AM  Result Value Ref Range   Sodium 136 135 - 145 mmol/L   Potassium 4.2 3.5 - 5.1 mmol/L   Chloride 106 101 - 111 mmol/L   CO2 22 22 - 32 mmol/L   Glucose, Bld 91 65 - 99 mg/dL   BUN 20 6 - 20 mg/dL   Creatinine, Ser 1.65 (H) 0.61 - 1.24 mg/dL    Calcium 7.8 (L) 8.9 - 10.3 mg/dL   Total Protein 5.5 (L) 6.5 - 8.1 g/dL   Albumin 1.8 (L) 3.5 - 5.0 g/dL   AST 19 15 - 41 U/L   ALT 10 (L) 17 - 63 U/L   Alkaline Phosphatase 44 38 - 126 U/L   Total Bilirubin 0.8 0.3 - 1.2 mg/dL   GFR calc non Af Amer 43 (L) >60 mL/min   GFR calc Af Amer 50 (  L) >60 mL/min    Comment: (NOTE) The eGFR has been calculated using the CKD EPI equation. This calculation has not been validated in all clinical situations. eGFR's persistently <60 mL/min signify possible Chronic Kidney Disease.    Anion gap 8 5 - 15  CBC     Status: Abnormal   Collection Time: 08/11/15  8:55 AM  Result Value Ref Range   WBC 9.5 4.0 - 10.5 K/uL   RBC 3.14 (L) 4.22 - 5.81 MIL/uL   Hemoglobin 9.4 (L) 13.0 - 17.0 g/dL   HCT 28.1 (L) 39.0 - 52.0 %   MCV 89.5 78.0 - 100.0 fL   MCH 29.9 26.0 - 34.0 pg   MCHC 33.5 30.0 - 36.0 g/dL   RDW 18.4 (H) 11.5 - 15.5 %   Platelets 504 (H) 150 - 400 K/uL  Glucose, capillary     Status: None   Collection Time: 08/11/15 11:22 AM  Result Value Ref Range   Glucose-Capillary 89 65 - 99 mg/dL    Ct Abdomen Pelvis W Contrast  08/10/2015  CLINICAL DATA:  History of stomach ulcers. Low hemoglobin. Abdominal pain for 1 week. EXAM: CT ABDOMEN AND PELVIS WITH CONTRAST TECHNIQUE: Multidetector CT imaging of the abdomen and pelvis was performed using the standard protocol following bolus administration of intravenous contrast. CONTRAST:  28m OMNIPAQUE IOHEXOL 300 MG/ML  SOLN COMPARISON:  01/18/2006. FINDINGS: Lower chest: No pleural effusion identified. The lung bases appear clear. Hepatobiliary: There is no focal liver abnormality. Multiple stones are identified within the gallbladder. The largest measures 1.9 cm. No biliary dilatation. Pancreas: The pancreas is unremarkable. Spleen: Negative. Adrenals/Urinary Tract: The adrenal glands are normal. Unremarkable appearance of the kidneys. The urinary bladder appears normal. Stomach/Bowel: There are  postsurgical changes previous Roux-en-Y gastrojejunostomy. There is moderate distension of the gastric lumen which is worrisome for gastric outlet obstruction. At the gastro jejunal anastomosis there is abnormal mucosal enhancement with the appearance of significant luminal narrowing. The Roux limb is diffusely distended with diffuse mucosal enhancement and marked inflammation and edema of the wall. The mid and distal small bowel loops are unremarkable. The proximal colon is normal. There is abnormal wall thickening involving transverse colon as it approximates the Roux limb compatible with secondary inflammation. No definite evidence for bowel perforation. No abscess noted at this time. Vascular/Lymphatic: Normal appearance of the abdominal aorta. No enlarged retroperitoneal or mesenteric adenopathy. No enlarged pelvic or inguinal lymph nodes. Reproductive: Prostate gland and seminal vesicles are unremarkable. Other: There is no free intraperitoneal air identified. There is no free fluid or abnormal fluid collections identified. Musculoskeletal: No aggressive lytic or sclerotic bone lesions. There is multi level degenerative disc disease identified within the lower thoracic and throughout the lumbar spine. IMPRESSION: 1. Postoperative appearance of the upper GI tract compatible with previous Roux-en-Y gastrojejunostomy. The Roux limb appears diffusely abnormal with marked wall thickening and inflammation as well as luminal distention. The distension of the stomach and Roux limb are worrisome for gastric outlet obstruction at the level of the gastrojejunostomy as well as potential obstruction at the jejunal enteric anastomosis. Etiology unknown but may be related to peptic ulcer disease. Underlying ischemia cannot be excluded. 2. No evidence for bowel perforation or abscess formation. No free intraperitoneal air identified. Electronically Signed   By: TKerby MoorsM.D.   On: 08/10/2015 18:51    ROSNegative  except above  Blood pressure 113/69, pulse 86, temperature 98.8 F (37.1 C), temperature source Oral, resp. rate 16, height 4'  11" (1.499 m), weight 52.209 kg (115 lb 1.6 oz), SpO2 100 %. Physical Exam No acute distress exam unremarkable please see preassessment evaluation labs and CT reviewed  Assessment/Plan: Concerns over gastric outlet obstruction and anastomotic ulcers Plan: I discussed endoscopy with the patient and his mother has signed consent and will proceed this afternoon with further workup and plans pending those findings  Rodman E 08/11/2015, 12:41 PM

## 2015-08-11 NOTE — Op Note (Signed)
[facility] [place], [id number]   ENDOSCOPY PROCEDURE REPORT  PATIENT: Donivon, Eulberg  MR#: [id number] BIRTHDATE: 1950-10-13 , 71  yrs. old GENDER: male ENDOSCOPIST: Clarene Essex, MD REFERRED BY:  Judeth Horn, M.D. PROCEDURE DATE:  2015/09/10 PROCEDURE:  EGD, diagnostic ASA CLASS:     Class III INDICATIONS:  abnormal CT of the GI tract and vomiting. MEDICATIONS: Fentanyl 25 mcg IV and Versed 3 mg IV TOPICAL ANESTHETIC: Cetacaine Spray  DESCRIPTION OF PROCEDURE: After the risks benefits and alternatives of the procedure were thoroughly explained, informed consent was obtained.  The [facility] [id number]) endoscope was introduced through the mouth and advanced to the proximal jejunum , Without limitations.  The instrument was slowly withdrawn as the mucosa was fully examined. Estimated blood loss is zero unless otherwise noted in this procedure report.    Retroflexed views revealed food.     The scope was then withdrawn from the patient and the procedure completed.  COMPLICATIONS: There were no immediate complications.  ENDOSCOPIC IMPRESSION: 1. Old food in the stomach proximally some suctioned 2. Native pylorus with obstruction 3. Just distal to the anastomosis large deep ulcer with stricturing but able to advance scope to proximal small bowel 4. Otherwise within normal limits  RECOMMENDATIONS: no aspirin or nonsteroidals will try pump inhibitors and Carafate and probably will need repeat endoscopy in one to 2 month to document healing        might need TPN for a while or consider a jejunal feeding tube but expect this to take a while to heal and might heal with more stricturing and it did not seem like a malignant ulcer but might need to be biopsied at some point in the future particularly if not healingREPEAT EXAM: as above  eSigned:  Clarene Essex, MD 09-10-2015 1:46 PM Revised: 09-10-15 1:46 PM   CC:  CPT  CODES: ICD CODES:  The ICD and CPT codes recommended by this software are interpretations from the data that the clinical staff has captured with the software.  The verification of the translation of this report to the ICD and CPT codes and modifiers is the sole responsibility of the health care institution and practicing physician where this report was generated.  [place]. will not be held responsible for the validity of the ICD and CPT codes included on this report.  AMA assumes no liability for data contained or not contained herein. CPT is a Designer, television/film set of the [employer].  PATIENT NAME:  Kayhan, Souffront MR#: [id number]

## 2015-08-12 ENCOUNTER — Encounter (HOSPITAL_COMMUNITY): Payer: Self-pay | Admitting: Gastroenterology

## 2015-08-12 DIAGNOSIS — N189 Chronic kidney disease, unspecified: Secondary | ICD-10-CM

## 2015-08-12 DIAGNOSIS — N179 Acute kidney failure, unspecified: Secondary | ICD-10-CM

## 2015-08-12 DIAGNOSIS — K311 Adult hypertrophic pyloric stenosis: Secondary | ICD-10-CM

## 2015-08-12 LAB — C DIFFICILE QUICK SCREEN W PCR REFLEX
C DIFFICLE (CDIFF) ANTIGEN: NEGATIVE
C Diff interpretation: NEGATIVE
C Diff toxin: NEGATIVE

## 2015-08-12 LAB — T3, FREE: T3, Free: 1.3 pg/mL — ABNORMAL LOW (ref 2.0–4.4)

## 2015-08-12 LAB — HEMOGLOBIN A1C
Hgb A1c MFr Bld: 5.6 % (ref 4.8–5.6)
MEAN PLASMA GLUCOSE: 114 mg/dL

## 2015-08-12 LAB — GLUCOSE, CAPILLARY: GLUCOSE-CAPILLARY: 80 mg/dL (ref 65–99)

## 2015-08-12 MED ORDER — ENSURE ENLIVE PO LIQD
237.0000 mL | Freq: Two times a day (BID) | ORAL | Status: DC
  Administered 2015-08-12 – 2015-08-24 (×19): 237 mL via ORAL

## 2015-08-12 NOTE — Clinical Documentation Improvement (Signed)
Internal Medicine  #1 of 2: Can the diagnosis of CKD be further specified?   CKD Stage I - GFR greater than or equal to 90  CKD Stage II - GFR 60-89  CKD Stage III - GFR 30-59  CKD Stage IV - GFR 15-29  Other condition  Unable to clinically determine  Supporting Information: : (risk factors, signs and symptoms, diagnostics, treatment) Current problem list contains acute kidney injury superimposed on chronic kidney disease.  Current hospital renal labs below. Component      BUN Creatinine  Latest Ref Rng      6 - 20 mg/dL 0.61 - 1.24 mg/dL  08/10/2015     1:59 PM 26 (H) 1.94 (H)  08/11/2015     6:00 AM 20 1.65 (H)   Component      EGFR (Non-African Amer.)  Latest Ref Rng      >60 mL/min  08/10/2015     1:59 PM 35 (L)  08/11/2015     6:00 AM 43 (L)   #2 of 2:  Can the diagnosis of acute kidney injury be further specified?     Acute Tubular Necrosis  Acute Renal Cortical Necrosis  Acute Renal Medullary Necrosis  Other  Clinically Undetermined  Please exercise your independent, professional judgment when responding. A specific answer is not anticipated or expected.Please update your documentation within the medical record to reflect your response to this query.   Thank you, Mateo Flow, RN [phone] Clinical Documentation Specialist

## 2015-08-12 NOTE — Procedures (Signed)
Pt placed on BiPAP with auto mode setting (Max-20, min-5) pt is comfortable and resting well.

## 2015-08-12 NOTE — Progress Notes (Signed)
Initial Nutrition Assessment  DOCUMENTATION CODES:   Not applicable  INTERVENTION:  Provide Ensure Enlive po BID, each supplement provides 350 kcal and 20 grams of protein.  Encourage adequate PO intake.   NUTRITION DIAGNOSIS:   Inadequate oral intake related to altered GI function as evidenced by meal completion < 50%.  GOAL:   Patient will meet greater than or equal to 90% of their needs  MONITOR:   PO intake, Supplement acceptance, Diet advancement, Weight trends, Labs, I & O's  REASON FOR ASSESSMENT:    (Per RN request for snacks/supplements)    ASSESSMENT:   64 y.o. 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 Large ulcer noted on EGD just distal to [place] anastomosis with some mild stricturing  Pt reports appetite is fine currently. Meal completion this AM was 50%. Family at bedside reports pt was unable to eat the 2-3 days PTA due to abdominal pain, however prior to the pain, pt eats well with at least 2 meals a day along with a Boost once daily. Usual body weight reported to be ~121 lbs. Weight has been stable. Pt is agreeable to nutritional supplements to aid in caloric and protein needs. RD to order. Per surgery note, if pt unable to tolerated oral diet, then consideration of another type of feeding access is needed. RD to continue to monitor.   Nutrition-Focused physical exam deferred at this time due to PMH of trisomy 80.   Labs and medications reviewed.   Diet Order:  Diet full liquid Room service appropriate?: Yes; Fluid consistency:: Thin  Skin:  Reviewed, no issues  Last BM:  12/8  Height:   Ht Readings from Last 1 Encounters:  08/11/15 4\' 11"  (1.499 m)    Weight:   Wt Readings from Last 1 Encounters:  08/11/15 122  lb 4.8 oz (55.475 kg)    Ideal Body Weight:  44.5 kg  BMI:  Body mass index is 24.69 kg/(m^2).  Estimated Nutritional Needs:   Kcal:  1500-1800  Protein:  70-80 grams  Fluid:  >/= 1.5 L/day  EDUCATION NEEDS:   No education needs identified at this time  Corrin Parker, MS, RD, LDN Pager # [phone] After hours/ weekend pager # [phone]

## 2015-08-12 NOTE — Progress Notes (Signed)
Patient ID: Isaac Ramirez, male   DOB: [birth year], 64 y.o.   MRN: [id number] 1 Day Post-Op  Subjective: Pt very happy this morning.  No complaints.  Ate clear liquids with no issues.  Had 3 BMs overnight  Objective: Vital signs in last 24 hours: Temp:  [98 F (36.7 C)-98.8 F (37.1 C)] 98 F (36.7 C) (12/09 0436) Pulse Rate:  [73-107] 74 (12/09 0436) Resp:  [10-23] 18 (12/09 0436) BP: (90-139)/(45-86) 109/73 mmHg (12/09 0436) SpO2:  [94 %-100 %] 100 % (12/09 0436) Weight:  [55.475 kg (122 lb 4.8 oz)] 55.475 kg (122 lb 4.8 oz) (12/08 2157) Last BM Date: 08/11/15  Intake/Output from previous day:   Intake/Output this shift: Total I/O In: 240 [P.O.:240] Out: -   PE: Abd: soft, mild bloating, +BS, NT Heart: regular  Lab Results:   Recent Labs  08/11/15 0600 08/11/15 0855  WBC 8.9 9.5  HGB 8.4* 9.4*  HCT 24.9* 28.1*  PLT 445* 504*   BMET  Recent Labs  08/10/15 1359 08/11/15 0600  NA 139 136  K 4.6 4.2  CL 104 106  CO2 27 22  GLUCOSE 116* 91  BUN 26* 20  CREATININE 1.94* 1.65*  CALCIUM 8.9 7.8*   PT/INR  Recent Labs  08/10/15 1900  LABPROT 14.6  INR 1.12   CMP     Component Value Date/Time   NA 136 08/11/2015 0600   K 4.2 08/11/2015 0600   CL 106 08/11/2015 0600   CO2 22 08/11/2015 0600   GLUCOSE 91 08/11/2015 0600   BUN 20 08/11/2015 0600   CREATININE 1.65* 08/11/2015 0600   CALCIUM 7.8* 08/11/2015 0600   PROT 5.5* 08/11/2015 0600   ALBUMIN 1.8* 08/11/2015 0600   AST 19 08/11/2015 0600   ALT 10* 08/11/2015 0600   ALKPHOS 44 08/11/2015 0600   BILITOT 0.8 08/11/2015 0600   GFRNONAA 43* 08/11/2015 0600   GFRAA 50* 08/11/2015 0600   Lipase     Component Value Date/Time   LIPASE 31 08/10/2015 1359       Studies/Results: Ct Abdomen Pelvis W Contrast  08/10/2015  CLINICAL DATA:  History of stomach ulcers. Low hemoglobin. Abdominal pain for 1 week. EXAM: CT ABDOMEN AND PELVIS WITH CONTRAST TECHNIQUE: Multidetector CT imaging of the  abdomen and pelvis was performed using the standard protocol following bolus administration of intravenous contrast. CONTRAST:  14mL OMNIPAQUE IOHEXOL 300 MG/ML  SOLN COMPARISON:  01/18/2006. FINDINGS: Lower chest: No pleural effusion identified. The lung bases appear clear. Hepatobiliary: There is no focal liver abnormality. Multiple stones are identified within the gallbladder. The largest measures 1.9 cm. No biliary dilatation. Pancreas: The pancreas is unremarkable. Spleen: Negative. Adrenals/Urinary Tract: The adrenal glands are normal. Unremarkable appearance of the kidneys. The urinary bladder appears normal. Stomach/Bowel: There are postsurgical changes previous Roux-en-Y gastrojejunostomy. There is moderate distension of the gastric lumen which is worrisome for gastric outlet obstruction. At the gastro jejunal anastomosis there is abnormal mucosal enhancement with the appearance of significant luminal narrowing. The Roux limb is diffusely distended with diffuse mucosal enhancement and marked inflammation and edema of the wall. The mid and distal small bowel loops are unremarkable. The proximal colon is normal. There is abnormal wall thickening involving transverse colon as it approximates the Roux limb compatible with secondary inflammation. No definite evidence for bowel perforation. No abscess noted at this time. Vascular/Lymphatic: Normal appearance of the abdominal aorta. No enlarged retroperitoneal or mesenteric adenopathy. No enlarged pelvic or inguinal lymph nodes. Reproductive: Prostate  gland and seminal vesicles are unremarkable. Other: There is no free intraperitoneal air identified. There is no free fluid or abnormal fluid collections identified. Musculoskeletal: No aggressive lytic or sclerotic bone lesions. There is multi level degenerative disc disease identified within the lower thoracic and throughout the lumbar spine. IMPRESSION: 1. Postoperative appearance of the upper GI tract compatible  with previous Roux-en-Y gastrojejunostomy. The Roux limb appears diffusely abnormal with marked wall thickening and inflammation as well as luminal distention. The distension of the stomach and Roux limb are worrisome for gastric outlet obstruction at the level of the gastrojejunostomy as well as potential obstruction at the jejunal enteric anastomosis. Etiology unknown but may be related to peptic ulcer disease. Underlying ischemia cannot be excluded. 2. No evidence for bowel perforation or abscess formation. No free intraperitoneal air identified. Electronically Signed   By: Kerby Moors M.D.   On: 08/10/2015 18:51    Anti-infectives: Anti-infectives    None       Assessment/Plan  1. 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 -cont to try oral diet and supplements.  If he does not tolerate this, then we may consider some type of feeding access, but would like to avoid this if possible. -will follow.   LOS: 2 days    Kaelum Kissick E 08/12/2015, 9:02 AM Pager: [phone]

## 2015-08-12 NOTE — ED Provider Notes (Signed)
CSN: [id number]     Arrival date & time 08/10/15  1258 History   First MD Initiated Contact with Patient 08/10/15 1655     Chief Complaint  Patient presents with  . Abdominal Pain     (Consider location/radiation/quality/duration/timing/severity/associated sxs/prior Treatment) HPI   Isaac Ramirez is a 64 y.o M with a pmhx of trisomy 36, PUD who presents to the ED today to be evaluation for abdominal pain and anemia. Level V caveat, pt with trisomy 21. Pts family states that the pt has been c/o of abdominal pain stating that "it burns" off and on for the last 10 days. They took him to his PCP today who checked his Hgb and it was 4.9. Pt was sent immediately to the ER to be evaluated. Pts family states that they have not noticed any melena or hematochezia. Pt has had decreased appetite over the last 10 days. Unknown when last bowel movement was. No reported vomiting. No fever. Pts family states that the pt had abdominal surgery many years ago, but were unsure what the surgery was or who performed it. No history of blood transfusion or anemia.  Past Medical History  Diagnosis Date  . PUD (peptic ulcer disease)   . Hypothyroidism   . Trisomy 21    Past Surgical History  Procedure Laterality Date  . Roux-en-y procedure  2007  . Vagotomy    . Esophagogastroduodenoscopy N/A 08/11/2015    Procedure: ESOPHAGOGASTRODUODENOSCOPY (EGD);  Surgeon: Clarene Essex, MD;  Location: [facility] ENDOSCOPY;  Service: Endoscopy;  Laterality: N/A;   Family History  Problem Relation Age of Onset  . Family history unknown: Yes   Social History  Substance Use Topics  . Smoking status: Never Smoker   . Smokeless tobacco: None  . Alcohol Use: No    Review of Systems  Unable to perform ROS: Other      Allergies  Penicillins and Sulfa antibiotics  Home Medications   Prior to Admission medications   Medication Sig Start Date End Date Taking? Authorizing Provider  esomeprazole (NEXIUM) 40 MG capsule Take  40 mg by mouth 2 (two) times daily. 07/05/15  Yes Historical Provider, MD  levothyroxine (SYNTHROID, LEVOTHROID) 150 MCG tablet Take 150 mcg by mouth daily before breakfast. 07/31/15  Yes Historical Provider, MD   BP 125/71 mmHg  Pulse 74  Temp(Src) 97.5 F (36.4 C) (Oral)  Resp 18  Ht 4\' 11"  (1.499 m)  Wt 55.475 kg  BMI 24.69 kg/m2  SpO2 100% Physical Exam  Constitutional: He appears well-nourished. No distress.  HENT:  Head: Normocephalic and atraumatic.  Mouth/Throat: No oropharyngeal exudate.  Eyes: Conjunctivae and EOM are normal. Pupils are equal, round, and reactive to light. Right eye exhibits no discharge. Left eye exhibits no discharge. No scleral icterus.  Neck: Neck supple. No JVD present.  Cardiovascular: Normal rate, regular rhythm, normal heart sounds and intact distal pulses.  Exam reveals no gallop and no friction rub.   No murmur heard. Pulmonary/Chest: Effort normal and breath sounds normal. No respiratory distress. He has no wheezes. He has no rales. He exhibits no tenderness.  Abdominal: Soft. He exhibits distension ( Mild distention.). He exhibits no shifting dullness, no abdominal bruit, no ascites, no pulsatile midline mass and no mass. Bowel sounds are decreased. There is no hepatosplenomegaly. There is tenderness ( diffuse tenderness to palpation. ). There is no rigidity, no guarding, no CVA tenderness and no tenderness at McBurney's point. A hernia is present. Hernia confirmed positive in the ventral  area.  Bowel sounds diminished. Obvious ventral hernia present.   Genitourinary: Rectum normal and prostate normal. Guaiac negative stool.  No blood or stool seen in rectal vault.  Musculoskeletal: Normal range of motion. He exhibits no edema.  Lymphadenopathy:    He has no cervical adenopathy.  Neurological: He is alert. No cranial nerve deficit.  Skin: Skin is warm and dry. No rash noted. He is not diaphoretic. No erythema. There is pallor.  Psychiatric: He has  a normal mood and affect. His behavior is normal.  Nursing note and vitals reviewed.   ED Course  Procedures (including critical care time) Labs Review Labs Reviewed  COMPREHENSIVE METABOLIC PANEL - Abnormal; Notable for the following:    Glucose, Bld 116 (*)    BUN 26 (*)    Creatinine, Ser 1.94 (*)    Albumin 2.3 (*)    ALT 12 (*)    GFR calc non Af Amer 35 (*)    GFR calc Af Amer 41 (*)    All other components within normal limits  CBC - Abnormal; Notable for the following:    RBC 2.34 (*)    Hemoglobin 7.0 (*)    HCT 22.3 (*)    Platelets 624 (*)    All other components within normal limits  URINALYSIS, ROUTINE W REFLEX MICROSCOPIC (NOT AT [facility]) - Abnormal; Notable for the following:    Ketones, ur 15 (*)    All other components within normal limits  VITAMIN B12 - Abnormal; Notable for the following:    Vitamin B-12 3405 (*)    All other components within normal limits  IRON AND TIBC - Abnormal; Notable for the following:    Iron 8 (*)    Saturation Ratios 3 (*)    All other components within normal limits  FERRITIN - Abnormal; Notable for the following:    Ferritin 12 (*)    All other components within normal limits  RETICULOCYTES - Abnormal; Notable for the following:    Retic Ct Pct 3.4 (*)    RBC. 2.05 (*)    All other components within normal limits  CBC - Abnormal; Notable for the following:    RBC 2.51 (*)    Hemoglobin 7.2 (*)    HCT 22.9 (*)    RDW 18.4 (*)    Platelets 535 (*)    All other components within normal limits  CBC - Abnormal; Notable for the following:    RBC 2.79 (*)    Hemoglobin 8.0 (*)    HCT 25.1 (*)    RDW 17.7 (*)    Platelets 497 (*)    All other components within normal limits  CBC - Abnormal; Notable for the following:    RBC 2.80 (*)    Hemoglobin 8.4 (*)    HCT 24.9 (*)    RDW 18.2 (*)    Platelets 445 (*)    All other components within normal limits  CBC - Abnormal; Notable for the following:    RBC 3.14 (*)     Hemoglobin 9.4 (*)    HCT 28.1 (*)    RDW 18.4 (*)    Platelets 504 (*)    All other components within normal limits  TSH - Abnormal; Notable for the following:    TSH 8.325 (*)    All other components within normal limits  OCCULT BLOOD X 1 CARD TO LAB, STOOL - Abnormal; Notable for the following:    Fecal Occult Bld POSITIVE (*)  All other components within normal limits  COMPREHENSIVE METABOLIC PANEL - Abnormal; Notable for the following:    Creatinine, Ser 1.65 (*)    Calcium 7.8 (*)    Total Protein 5.5 (*)    Albumin 1.8 (*)    ALT 10 (*)    GFR calc non Af Amer 43 (*)    GFR calc Af Amer 50 (*)    All other components within normal limits  T3, FREE - Abnormal; Notable for the following:    T3, Free 1.3 (*)    All other components within normal limits  C DIFFICILE QUICK SCREEN W PCR REFLEX  LIPASE, BLOOD  FOLATE  PROTIME-INR  MAGNESIUM  HEMOGLOBIN A1C  BRAIN NATRIURETIC PEPTIDE  T4, FREE  GLUCOSE, CAPILLARY  GLUCOSE, CAPILLARY  POC OCCULT BLOOD, ED  TYPE AND SCREEN  PREPARE RBC (CROSSMATCH)  ABO/RH  PREPARE RBC (CROSSMATCH)    Imaging Review Ct Abdomen Pelvis W Contrast  08/10/2015  CLINICAL DATA:  History of stomach ulcers. Low hemoglobin. Abdominal pain for 1 week. EXAM: CT ABDOMEN AND PELVIS WITH CONTRAST TECHNIQUE: Multidetector CT imaging of the abdomen and pelvis was performed using the standard protocol following bolus administration of intravenous contrast. CONTRAST:  52mL OMNIPAQUE IOHEXOL 300 MG/ML  SOLN COMPARISON:  01/18/2006. FINDINGS: Lower chest: No pleural effusion identified. The lung bases appear clear. Hepatobiliary: There is no focal liver abnormality. Multiple stones are identified within the gallbladder. The largest measures 1.9 cm. No biliary dilatation. Pancreas: The pancreas is unremarkable. Spleen: Negative. Adrenals/Urinary Tract: The adrenal glands are normal. Unremarkable appearance of the kidneys. The urinary bladder appears normal.  Stomach/Bowel: There are postsurgical changes previous Roux-en-Y gastrojejunostomy. There is moderate distension of the gastric lumen which is worrisome for gastric outlet obstruction. At the gastro jejunal anastomosis there is abnormal mucosal enhancement with the appearance of significant luminal narrowing. The Roux limb is diffusely distended with diffuse mucosal enhancement and marked inflammation and edema of the wall. The mid and distal small bowel loops are unremarkable. The proximal colon is normal. There is abnormal wall thickening involving transverse colon as it approximates the Roux limb compatible with secondary inflammation. No definite evidence for bowel perforation. No abscess noted at this time. Vascular/Lymphatic: Normal appearance of the abdominal aorta. No enlarged retroperitoneal or mesenteric adenopathy. No enlarged pelvic or inguinal lymph nodes. Reproductive: Prostate gland and seminal vesicles are unremarkable. Other: There is no free intraperitoneal air identified. There is no free fluid or abnormal fluid collections identified. Musculoskeletal: No aggressive lytic or sclerotic bone lesions. There is multi level degenerative disc disease identified within the lower thoracic and throughout the lumbar spine. IMPRESSION: 1. Postoperative appearance of the upper GI tract compatible with previous Roux-en-Y gastrojejunostomy. The Roux limb appears diffusely abnormal with marked wall thickening and inflammation as well as luminal distention. The distension of the stomach and Roux limb are worrisome for gastric outlet obstruction at the level of the gastrojejunostomy as well as potential obstruction at the jejunal enteric anastomosis. Etiology unknown but may be related to peptic ulcer disease. Underlying ischemia cannot be excluded. 2. No evidence for bowel perforation or abscess formation. No free intraperitoneal air identified. Electronically Signed   By: Kerby Moors M.D.   On: 08/10/2015  18:51   I have personally reviewed and evaluated these images and lab results as part of my medical decision-making.   EKG Interpretation None      MDM   Final diagnoses:  Gastric outlet obstruction    Pt present  from PCP office due to Hgb of 4.9. Pt has trisomy 21, unable to provide history. Per family c/o abd pain for 10 days. Pt is alert and interactive in ED. VSS. Skin appears to be pale. Blood work results from PCP office was brought with pt which reports a Hgb of 4.9. On repeat CBC here, hgb is 7. Will type & screen and plan to transfuse 2 units. Attempted to collect stool sample on DRE however, no stool seen on DRE. Residue on glove sent for hemoccult, which was negative. Suspect this is because it was not a true sample. Abd very tender on exam. Plan to CT to further assess. Not able to view previous medical documentation on this pt. Due to condition of pt, unable to get accurate pmhx. Pt also given fluids and pain medication.  CT reveals that pt had a previosu Roux-en-Y procedure. Roux limb appears diffusely abnormal with marked wall thickening and inflammation as well as luminal distention. Concern for Gastric Outlet Obstruction. Etiology unknown but may be related to PUD.   Received call from radiologist to relay results, he also states that this will likely require surgery to correct.  Spoke with general surgery, Dr. Marlowe Aschoff PA who recommends admission for the pt. They will consult pt on the floor.  Spoke with hospitalist who will admit pt to their service.    Dondra Spry [place], PA-C 08/12/15 1557  Milton Ferguson, MD 08/17/15 [phone]

## 2015-08-12 NOTE — Progress Notes (Signed)
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 He was asked to come to ED, and his repeat Hemoglobin was found to be 7. He received 2 units of prbc transfusion and underwent CT of the abdomen. CT showed diffuse wall thickness and dilation of the Roux limb suggestive of gastric outlet obstruction. There was no evidence of perforation or hemoperitoneum on the imaging study. Dr. Barry Dienes of surgery was consulted from the emergency department and saw the patient, recommended Gi consultation and possible EGD today. GI consult from [facility] requested. Of note his hemoccult was negative Assessment/Plan: GOO,  Large ulcer int he stomach distal to the anastomosis: PPI, advance diet as tolerated.  Keep the patient on full liquids for 48 hours and monitor as per GI recommendations.  Add nutritional supplements per nutrition.  Pt currently denies any nausea or vomiting.  Surgery consutled and no surgical intervention at this time.    Hypothyroidism: Resume synthroid. Free t4 normal.    Stage 3 CKD: STABLE to improving with hydration.       Code Status: full code.  Family Communication: none at bedside this am Disposition Plan: pending .   Consultants:  Gastroenterology  Surgery.   Procedures:  EGD.  Antibiotics:  none  HPI/Subjective: No new complaints. None at bedside.  Objective: Filed Vitals:   08/12/15 0937 08/12/15 1750  BP: 125/71 110/60  Pulse: 74 80  Temp: 97.5 F (36.4 C) 98 F (36.7 C)  Resp: 18 18    Intake/Output Summary (Last 24 hours) at 08/12/15 1903 Last data filed at 08/12/15  1751  Gross per 24 hour  Intake   1397 ml  Output      0 ml  Net   1397 ml   Filed Weights   08/10/15 2242 08/11/15 2157  Weight: 52.209 kg (115 lb 1.6 oz) 55.475 kg (122 lb 4.8 oz)    Exam:   General:  Alert comfortable.   Cardiovascular: s1s2  Respiratory: ctab  Abdomen:soft NT ndbs+  Musculoskeletal: no pedal edema.   Data Reviewed: Basic Metabolic Panel:  Recent Labs Lab 08/10/15 1359 08/10/15 2304 08/11/15 0600  NA 139  --  136  K 4.6  --  4.2  CL 104  --  106  CO2 27  --  22  GLUCOSE 116*  --  91  BUN 26*  --  20  CREATININE 1.94*  --  1.65*  CALCIUM 8.9  --  7.8*  MG  --  1.8  --    Liver Function Tests:  Recent Labs Lab 08/10/15 1359 08/11/15 0600  AST 20 19  ALT 12* 10*  ALKPHOS 54 44  BILITOT 0.5 0.8  PROT 6.9 5.5*  ALBUMIN 2.3* 1.8*    Recent Labs Lab 08/10/15 1359  LIPASE 31   No results for input(s): AMMONIA in the last 168 hours. CBC:  Recent Labs Lab 08/10/15 1359 08/10/15 2304 08/11/15 0126 08/11/15 0600 08/11/15 0855  WBC 10.2 9.9 9.6 8.9 9.5  HGB 7.0* 7.2* 8.0* 8.4* 9.4*  HCT 22.3* 22.9* 25.1* 24.9* 28.1*  MCV 95.3 91.2 90.0 88.9 89.5  PLT 624* 535* 497* 445* 504*  Cardiac Enzymes: No results for input(s): CKTOTAL, CKMB, CKMBINDEX, TROPONINI in the last 168 hours. BNP (last 3 results)  Recent Labs  08/11/15 0315  BNP 80.1    ProBNP (last 3 results) No results for input(s): PROBNP in the last 8760 hours.  CBG:  Recent Labs Lab 08/11/15 1122 08/12/15 0802  GLUCAP 89 80    Recent Results (from the past 240 hour(s))  C difficile quick scan w PCR reflex     Status: None   Collection Time: 08/12/15 12:38 AM  Result Value Ref Range Status   C Diff antigen NEGATIVE NEGATIVE Final   C Diff toxin NEGATIVE NEGATIVE Final   C Diff interpretation Negative for toxigenic C. difficile  Final     Studies: No results found.  Scheduled Meds: . antiseptic oral rinse  7 mL Mouth Rinse q12n4p  .  chlorhexidine  15 mL Mouth Rinse BID  . feeding supplement (ENSURE ENLIVE)  237 mL Oral BID BM  . levothyroxine  150 mcg Oral QAC breakfast  . pantoprazole (PROTONIX) IV  40 mg Intravenous Q12H  . sodium chloride  3 mL Intravenous Q12H  . sucralfate  1 g Oral QID   Continuous Infusions: . sodium chloride 75 mL/hr at 08/11/15 1945    Principal Problem:   Blood loss anemia Active Problems:   Gastric out let obstruction   Hypothyroidism   Acute kidney injury superimposed on chronic kidney disease (HCC)   PUD (peptic ulcer disease)    Time spent: 25 minutes.     [facility] Pager [phone] If 7PM-7AM, please contact night-coverage at [email or web address], password [facility] 08/12/2015, 7:03 PM  LOS: 2 days

## 2015-08-12 NOTE — Progress Notes (Signed)
Isaac Ramirez 10:56 AM  Subjective: Patient doing well this morning without any obvious post EGD problems and his case was discussed with his family and the surgical team  Objective: Vital signs stable afebrile no acute distress abdomen is soft nontender  Assessment: Post anastomotic ulcer with stricturing  Plan: Would not advanced past full liquids for a few days but if tolerated can add ensure [employer] etc.  [facility] E  Pager [phone] After 5PM or if no answer call [phone]

## 2015-08-13 LAB — CBC
HEMATOCRIT: 27.8 % — AB (ref 39.0–52.0)
HEMOGLOBIN: 8.9 g/dL — AB (ref 13.0–17.0)
MCH: 29.2 pg (ref 26.0–34.0)
MCHC: 32 g/dL (ref 30.0–36.0)
MCV: 91.1 fL (ref 78.0–100.0)
Platelets: 499 10*3/uL — ABNORMAL HIGH (ref 150–400)
RBC: 3.05 MIL/uL — ABNORMAL LOW (ref 4.22–5.81)
RDW: 17.1 % — ABNORMAL HIGH (ref 11.5–15.5)
WBC: 4.5 10*3/uL (ref 4.0–10.5)

## 2015-08-13 LAB — GLUCOSE, CAPILLARY: Glucose-Capillary: 105 mg/dL — ABNORMAL HIGH (ref 65–99)

## 2015-08-13 MED ORDER — PANTOPRAZOLE SODIUM 40 MG PO PACK
40.0000 mg | PACK | Freq: Two times a day (BID) | ORAL | Status: DC
  Administered 2015-08-13 – 2015-08-17 (×8): 40 mg
  Filled 2015-08-13 (×8): qty 20

## 2015-08-13 NOTE — Progress Notes (Signed)
2 Days Post-Op  Subjective: tolerating fulls  Objective: Vital signs in last 24 hours: Temp:  [97.5 F (36.4 C)-98.9 F (37.2 C)] 98.4 F (36.9 C) (12/10 0854) Pulse Rate:  [71-82] 71 (12/10 0854) Resp:  [18-20] 20 (12/10 0854) BP: (110-133)/(60-86) 115/75 mmHg (12/10 0854) SpO2:  [100 %] 100 % (12/10 0854) Weight:  [51.256 kg (113 lb)] 51.256 kg (113 lb) (12/09 2052) Last BM Date: 08/12/15  Intake/Output from previous day: 12/09 0701 - 12/10 0700 In: 2357 [P.O.:1457; I.V.:900] Out: 0  Intake/Output this shift: Total I/O In: 240 [P.O.:240] Out: 0   General appearance: cooperative Resp: clear to auscultation bilaterally GI: soft, NT  Lab Results:   Recent Labs  08/11/15 0600 08/11/15 0855  WBC 8.9 9.5  HGB 8.4* 9.4*  HCT 24.9* 28.1*  PLT 445* 504*   BMET  Recent Labs  08/10/15 1359 08/11/15 0600  NA 139 136  K 4.6 4.2  CL 104 106  CO2 27 22  GLUCOSE 116* 91  BUN 26* 20  CREATININE 1.94* 1.65*  CALCIUM 8.9 7.8*   PT/INR  Recent Labs  08/10/15 1900  LABPROT 14.6  INR 1.12   ABG No results for input(s): PHART, HCO3 in the last 72 hours.  Invalid input(s): PCO2, PO2  Studies/Results: No results found.  Anti-infectives: Anti-infectives    None      Assessment/Plan: Jejunal ulcer just distal to GJ - continue fulls for 3d per GI. No need for surgery at this time. I spoke with his family.  LOS: 3 days    Issabella Rix E 08/13/2015

## 2015-08-13 NOTE — Progress Notes (Signed)
Subjective: No abdominal pain. No nausea/vomiting.  Objective: Vital signs in last 24 hours: Temp:  [98 F (36.7 C)-98.9 F (37.2 C)] 98.4 F (36.9 C) (12/10 0854) Pulse Rate:  [71-82] 71 (12/10 0854) Resp:  [18-20] 20 (12/10 0854) BP: (110-133)/(60-86) 115/75 mmHg (12/10 0854) SpO2:  [100 %] 100 % (12/10 0854) Weight:  [51.256 kg (113 lb)] 51.256 kg (113 lb) (12/09 2052) Weight change: -4.218 kg (-9 lb 4.8 oz) Last BM Date: 08/13/15  PE: GEN:  NAD ABD:  Soft, large abdominal incisional hernia, moderate distention with tympany (baseline per family)  Lab Results: CBC    Component Value Date/Time   WBC 4.5 08/13/2015 1057   RBC 3.05* 08/13/2015 1057   RBC 2.05* 08/10/2015 1900   HGB 8.9* 08/13/2015 1057   HCT 27.8* 08/13/2015 1057   PLT 499* 08/13/2015 1057   MCV 91.1 08/13/2015 1057   MCH 29.2 08/13/2015 1057   MCHC 32.0 08/13/2015 1057   RDW 17.1* 08/13/2015 1057   CMP     Component Value Date/Time   NA 136 08/11/2015 0600   K 4.2 08/11/2015 0600   CL 106 08/11/2015 0600   CO2 22 08/11/2015 0600   GLUCOSE 91 08/11/2015 0600   BUN 20 08/11/2015 0600   CREATININE 1.65* 08/11/2015 0600   CALCIUM 7.8* 08/11/2015 0600   PROT 5.5* 08/11/2015 0600   ALBUMIN 1.8* 08/11/2015 0600   AST 19 08/11/2015 0600   ALT 10* 08/11/2015 0600   ALKPHOS 44 08/11/2015 0600   BILITOT 0.8 08/11/2015 0600   GFRNONAA 43* 08/11/2015 0600   GFRAA 50* 08/11/2015 0600   Assessment:  1.  Nausea, vomiting.  Resolved. 2.  Abdominal pain, improved. 3.  Large duodenal ulcer with associated luminal stenosis.  Plan:  1.  Continue PPI therapy. 2.  Advance to soft diet. 3.  [facility] will follow.   Landry Dyke 08/13/2015, 5:44 PM   Pager [phone] If no answer or after 5 PM call [phone]

## 2015-08-13 NOTE — Progress Notes (Signed)
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 He was asked to come to ED, and his repeat Hemoglobin was found to be 7. He received 2 units of prbc transfusion and underwent CT of the abdomen. CT showed diffuse wall thickness and dilation of the Roux limb suggestive of gastric outlet obstruction. There was no evidence of perforation or hemoperitoneum on the imaging study. Dr. Barry Dienes of surgery was consulted from the emergency department and saw the patient, recommended Gi consultation and possible EGD today. GI consult from [facility] requested. Of note his hemoccult was negative Assessment/Plan: GOO,  Large ulcer int he stomach distal to the anastomosis: PPI, advance diet as tolerated.  Keep the patient on full liquids for 48 hours and monitor as per GI recommendations.  Add nutritional supplements per nutrition.  Pt currently denies any nausea or vomiting.  Surgery consutled and no surgical intervention at this time.    Hypothyroidism: Resume synthroid. Free t4 normal.    Stage 3 CKD: STABLE to improving with hydration.       Code Status: full code.  Family Communication: none at bedside this am Disposition Plan: pending .   Consultants:  Gastroenterology  Surgery.   Procedures:  EGD.  Antibiotics:  none  HPI/Subjective: No new complaints. None at bedside.  Objective: Filed Vitals:   08/13/15 0501 08/13/15 0854  BP: 133/86 115/75  Pulse: 81 71  Temp: 98.6 F (37 C) 98.4 F (36.9 C)  Resp: 19 20    Intake/Output Summary (Last 24 hours) at 08/13/15 1733 Last data filed at 08/13/15  0908  Gross per 24 hour  Intake   1437 ml  Output      0 ml  Net   1437 ml   Filed Weights   08/10/15 2242 08/11/15 2157 08/12/15 2052  Weight: 52.209 kg (115 lb 1.6 oz) 55.475 kg (122 lb 4.8 oz) 51.256 kg (113 lb)    Exam:   General:  Alert comfortable.   Cardiovascular: s1s2  Respiratory: ctab  Abdomen:soft NT ndbs+  Musculoskeletal: no pedal edema.   Data Reviewed: Basic Metabolic Panel:  Recent Labs Lab 08/10/15 1359 08/10/15 2304 08/11/15 0600  NA 139  --  136  K 4.6  --  4.2  CL 104  --  106  CO2 27  --  22  GLUCOSE 116*  --  91  BUN 26*  --  20  CREATININE 1.94*  --  1.65*  CALCIUM 8.9  --  7.8*  MG  --  1.8  --    Liver Function Tests:  Recent Labs Lab 08/10/15 1359 08/11/15 0600  AST 20 19  ALT 12* 10*  ALKPHOS 54 44  BILITOT 0.5 0.8  PROT 6.9 5.5*  ALBUMIN 2.3* 1.8*    Recent Labs Lab 08/10/15 1359  LIPASE 31   No results for input(s): AMMONIA in the last 168 hours. CBC:  Recent Labs Lab 08/10/15 2304 08/11/15 0126 08/11/15 0600 08/11/15 0855 08/13/15 1057  WBC 9.9 9.6 8.9 9.5 4.5  HGB 7.2* 8.0* 8.4* 9.4* 8.9*  HCT 22.9* 25.1* 24.9* 28.1* 27.8*  MCV 91.2 90.0 88.9 89.5 91.1  PLT  535* 497* 445* 504* 499*   Cardiac Enzymes: No results for input(s): CKTOTAL, CKMB, CKMBINDEX, TROPONINI in the last 168 hours. BNP (last 3 results)  Recent Labs  08/11/15 0315  BNP 80.1    ProBNP (last 3 results) No results for input(s): PROBNP in the last 8760 hours.  CBG:  Recent Labs Lab 08/11/15 1122 08/12/15 0802 08/13/15 0740  GLUCAP 89 80 105*    Recent Results (from the past 240 hour(s))  C difficile quick scan w PCR reflex     Status: None   Collection Time: 08/12/15 12:38 AM  Result Value Ref Range Status   C Diff antigen NEGATIVE NEGATIVE Final   C Diff toxin NEGATIVE NEGATIVE Final   C Diff interpretation Negative for toxigenic C. difficile  Final     Studies: No results found.  Scheduled Meds: . antiseptic  oral rinse  7 mL Mouth Rinse q12n4p  . chlorhexidine  15 mL Mouth Rinse BID  . feeding supplement (ENSURE ENLIVE)  237 mL Oral BID BM  . levothyroxine  150 mcg Oral QAC breakfast  . pantoprazole sodium  40 mg Per Tube BID  . sodium chloride  3 mL Intravenous Q12H  . sucralfate  1 g Oral QID   Continuous Infusions: . sodium chloride 75 mL/hr at 08/13/15 1236    Principal Problem:   Blood loss anemia Active Problems:   Gastric out let obstruction   Hypothyroidism   Acute kidney injury superimposed on chronic kidney disease (HCC)   PUD (peptic ulcer disease)    Time spent: 25 minutes.     [facility] Pager [phone] If 7PM-7AM, please contact night-coverage at [email or web address], password [facility] 08/13/2015, 5:33 PM  LOS: 3 days

## 2015-08-14 LAB — GLUCOSE, CAPILLARY: GLUCOSE-CAPILLARY: 95 mg/dL (ref 65–99)

## 2015-08-14 NOTE — Progress Notes (Signed)
Subjective: Resting comfortably in bed. Reportedly tolerated increase in diet to soft diet.  Objective: Vital signs in last 24 hours: Temp:  [97.9 F (36.6 C)-98.5 F (36.9 C)] 98 F (36.7 C) (12/11 0900) Pulse Rate:  [74-85] 85 (12/11 0900) Resp:  [16-20] 16 (12/11 0900) BP: (93-127)/(61-75) 100/75 mmHg (12/11 0900) SpO2:  [96 %-100 %] 96 % (12/11 0900) Weight:  [51.665 kg (113 lb 14.4 oz)] 51.665 kg (113 lb 14.4 oz) (12/10 2044) Weight change: 0.408 kg (14.4 oz) Last BM Date: 08/13/15  PE: GEN:  Resting comfortably.  Lab Results: CBC    Component Value Date/Time   WBC 4.5 08/13/2015 1057   RBC 3.05* 08/13/2015 1057   RBC 2.05* 08/10/2015 1900   HGB 8.9* 08/13/2015 1057   HCT 27.8* 08/13/2015 1057   PLT 499* 08/13/2015 1057   MCV 91.1 08/13/2015 1057   MCH 29.2 08/13/2015 1057   MCHC 32.0 08/13/2015 1057   RDW 17.1* 08/13/2015 1057   CMP     Component Value Date/Time   NA 136 08/11/2015 0600   K 4.2 08/11/2015 0600   CL 106 08/11/2015 0600   CO2 22 08/11/2015 0600   GLUCOSE 91 08/11/2015 0600   BUN 20 08/11/2015 0600   CREATININE 1.65* 08/11/2015 0600   CALCIUM 7.8* 08/11/2015 0600   PROT 5.5* 08/11/2015 0600   ALBUMIN 1.8* 08/11/2015 0600   AST 19 08/11/2015 0600   ALT 10* 08/11/2015 0600   ALKPHOS 44 08/11/2015 0600   BILITOT 0.8 08/11/2015 0600   GFRNONAA 43* 08/11/2015 0600   GFRAA 50* 08/11/2015 0600   Assessment:  1. Nausea, vomiting. Resolved. 2. Abdominal pain, improved. 3. Large duodenal ulcer with associated luminal stenosis.  Plan:  1.  Continue soft diet. 2.  Continue pantoprazole and sucralfate. 3.  [facility] will follow.  Landry Dyke 08/14/2015, 1:34 PM   Pager [phone] If no answer or after 5 PM call [phone]

## 2015-08-14 NOTE — Progress Notes (Signed)
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 He was asked to come to ED, and his repeat Hemoglobin was found to be 7. He received 2 units of prbc transfusion and underwent CT of the abdomen. CT showed diffuse wall thickness and dilation of the Roux limb suggestive of gastric outlet obstruction. There was no evidence of perforation or hemoperitoneum on the imaging study. Dr. Barry Dienes of surgery was consulted from the emergency department and saw the patient, recommended Gi consultation and possible EGD today. GI consult from [facility] requested. Of note his hemoccult was negative.  Assessment/Plan: GOO,  Large ulcer int he stomach distal to the anastomosis: PPI, advance diet as tolerated. Currently on soft diet, monitor on soft diet for another  24 hours and advance to regular in am. If able to tolerate regular diet, will discharge him home in am.  Add nutritional supplements per nutrition.  Pt currently denies any nausea or vomiting.  Surgery consutled and no surgical intervention at this time.    Hypothyroidism: Resume synthroid. Free t4 normal. Low free t3, levels, recommend checking the trhyroid panel in 4 weeks and make changes in synthroid.    Stage 3 CKD: STABLE to improving with hydration.    Anemia of chronic disease: Stable after the 2 unit s of prbc trasnfusion.  Recheck cbc in am.     Code Status: full code.  Family Communication: family  at bedside this am Disposition Plan: pending, possibly home in am if able to tolerate regular diet.  .   Consultants:  Gastroenterology  Surgery.    Procedures:  EGD.  Antibiotics:  none  HPI/Subjective: No new complaints no nausea or vomiting. No abdominal pain.  Objective: Filed Vitals:   08/13/15 2215 08/14/15 0602  BP:  93/61  Pulse: 74 82  Temp:  97.9 F (36.6 C)  Resp: 16 16    Intake/Output Summary (Last 24 hours) at 08/14/15 1138 Last data filed at 08/14/15 0700  Gross per 24 hour  Intake 1456.25 ml  Output      0 ml  Net 1456.25 ml   Filed Weights   08/11/15 2157 08/12/15 2052 08/13/15 2044  Weight: 55.475 kg (122 lb 4.8 oz) 51.256 kg (113 lb) 51.665 kg (113 lb 14.4 oz)    Exam:   General:  Alert comfortable.   Cardiovascular: s1s2  Respiratory: ctab  Abdomen:soft NT ndbs+  Musculoskeletal: no pedal edema.   Data Reviewed: Basic Metabolic Panel:  Recent Labs Lab 08/10/15 1359 08/10/15 2304 08/11/15 0600  NA 139  --  136  K 4.6  --  4.2  CL 104  --  106  CO2 27  --  22  GLUCOSE 116*  --  91  BUN 26*  --  20  CREATININE 1.94*  --  1.65*  CALCIUM 8.9  --  7.8*  MG  --  1.8  --    Liver Function Tests:  Recent Labs Lab 08/10/15 1359 08/11/15 0600  AST 20 19  ALT 12* 10*  ALKPHOS 54 44  BILITOT 0.5 0.8  PROT 6.9 5.5*  ALBUMIN 2.3* 1.8*    Recent Labs Lab 08/10/15  1359  LIPASE 31   No results for input(s): AMMONIA in the last 168 hours. CBC:  Recent Labs Lab 08/10/15 2304 08/11/15 0126 08/11/15 0600 08/11/15 0855 08/13/15 1057  WBC 9.9 9.6 8.9 9.5 4.5  HGB 7.2* 8.0* 8.4* 9.4* 8.9*  HCT 22.9* 25.1* 24.9* 28.1* 27.8*  MCV 91.2 90.0 88.9 89.5 91.1  PLT 535* 497* 445* 504* 499*   Cardiac Enzymes: No results for input(s): CKTOTAL, CKMB, CKMBINDEX, TROPONINI in the last 168 hours. BNP (last 3 results)  Recent Labs  08/11/15 0315  BNP 80.1    ProBNP (last 3 results) No results for input(s): PROBNP in the last 8760 hours.  CBG:  Recent Labs Lab 08/11/15 1122 08/12/15 0802 08/13/15 0740 08/14/15 0749  GLUCAP 89 80 105* 95    Recent Results  (from the past 240 hour(s))  C difficile quick scan w PCR reflex     Status: None   Collection Time: 08/12/15 12:38 AM  Result Value Ref Range Status   C Diff antigen NEGATIVE NEGATIVE Final   C Diff toxin NEGATIVE NEGATIVE Final   C Diff interpretation Negative for toxigenic C. difficile  Final     Studies: No results found.  Scheduled Meds: . antiseptic oral rinse  7 mL Mouth Rinse q12n4p  . chlorhexidine  15 mL Mouth Rinse BID  . feeding supplement (ENSURE ENLIVE)  237 mL Oral BID BM  . levothyroxine  150 mcg Oral QAC breakfast  . pantoprazole sodium  40 mg Per Tube BID  . sodium chloride  3 mL Intravenous Q12H  . sucralfate  1 g Oral QID   Continuous Infusions: . sodium chloride 75 mL/hr at 08/13/15 2110    Principal Problem:   Blood loss anemia Active Problems:   Gastric out let obstruction   Hypothyroidism   Acute kidney injury superimposed on chronic kidney disease (HCC)   PUD (peptic ulcer disease)    Time spent: 25 minutes.     [facility] Pager [phone] If 7PM-7AM, please contact night-coverage at [email or web address], password [facility] 08/14/2015, 11:38 AM  LOS: 4 days

## 2015-08-14 NOTE — Progress Notes (Signed)
Patient removed CPAP mask after about 2 hours of wearing and refused to put it back on.

## 2015-08-15 LAB — BASIC METABOLIC PANEL
Anion gap: 9 (ref 5–15)
BUN: 10 mg/dL (ref 6–20)
CALCIUM: 8.3 mg/dL — AB (ref 8.9–10.3)
CO2: 26 mmol/L (ref 22–32)
CREATININE: 1.49 mg/dL — AB (ref 0.61–1.24)
Chloride: 101 mmol/L (ref 101–111)
GFR, EST AFRICAN AMERICAN: 56 mL/min — AB (ref >60)
GFR, EST NON AFRICAN AMERICAN: 49 mL/min — AB (ref >60)
Glucose, Bld: 148 mg/dL — ABNORMAL HIGH (ref 65–99)
Potassium: 3.9 mmol/L (ref 3.5–5.1)
SODIUM: 136 mmol/L (ref 135–145)

## 2015-08-15 LAB — CBC
HCT: 31.3 % — ABNORMAL LOW (ref 39.0–52.0)
HEMOGLOBIN: 9.8 g/dL — AB (ref 13.0–17.0)
MCH: 28.6 pg (ref 26.0–34.0)
MCHC: 31.3 g/dL (ref 30.0–36.0)
MCV: 91.3 fL (ref 78.0–100.0)
PLATELETS: 522 10*3/uL — AB (ref 150–400)
RBC: 3.43 MIL/uL — ABNORMAL LOW (ref 4.22–5.81)
RDW: 16.2 % — AB (ref 11.5–15.5)
WBC: 9.2 10*3/uL (ref 4.0–10.5)

## 2015-08-15 LAB — GLUCOSE, CAPILLARY: GLUCOSE-CAPILLARY: 142 mg/dL — AB (ref 65–99)

## 2015-08-15 MED ORDER — ONDANSETRON HCL 4 MG/2ML IJ SOLN
4.0000 mg | Freq: Four times a day (QID) | INTRAMUSCULAR | Status: DC | PRN
  Administered 2015-08-15: 4 mg via INTRAVENOUS
  Filled 2015-08-15 (×2): qty 2

## 2015-08-15 NOTE — Care Management Important Message (Signed)
Important Message  Patient Details  Name: Isaac Ramirez MRN: [id number] Date of Birth: [birth year]   Medicare Important Message Given:  Yes    Jaisen Wiltrout P Mahli Glahn 08/15/2015, 3:26 PM

## 2015-08-15 NOTE — Progress Notes (Signed)
4 Days Post-Op  Subjective: Developed abdominal pain and emesis overnight with soft diet Feels better now  Objective: Vital signs in last 24 hours: Temp:  [97.2 F (36.2 C)-98.9 F (37.2 C)] 98.8 F (37.1 C) (12/12 0602) Pulse Rate:  [81-93] 93 (12/12 0602) Resp:  [16-18] 18 (12/12 0602) BP: (100-167)/(66-86) 167/86 mmHg (12/12 0602) SpO2:  [96 %-98 %] 97 % (12/12 0602) Last BM Date: 08/13/15  Intake/Output from previous day: 12/11 0701 - 12/12 0700 In: 480 [P.O.:480] Out: -  Intake/Output this shift:    Abdomen soft, non distended, non tender  Lab Results:   Recent Labs  08/13/15 1057  WBC 4.5  HGB 8.9*  HCT 27.8*  PLT 499*   BMET No results for input(s): NA, K, CL, CO2, GLUCOSE, BUN, CREATININE, CALCIUM in the last 72 hours. PT/INR No results for input(s): LABPROT, INR in the last 72 hours. ABG No results for input(s): PHART, HCO3 in the last 72 hours.  Invalid input(s): PCO2, PO2  Studies/Results: No results found.  Anti-infectives: Anti-infectives    None      Assessment/Plan: s/p Procedure(s): ESOPHAGOGASTRODUODENOSCOPY (EGD) (N/A)  Anastomotic ulcer  Will back down to full liquid diet. Would still give him more time before considering surgery.  I discussed this with his family and they are in agreement and want to hold on surgery  LOS: 5 days    Draylen Lobue A 08/15/2015

## 2015-08-15 NOTE — Progress Notes (Signed)
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 He was asked to come to ED, and his repeat Hemoglobin was found to be 7. He received 2 units of prbc transfusion and underwent CT of the abdomen. CT showed diffuse wall thickness and dilation of the Roux limb suggestive of gastric outlet obstruction. There was no evidence of perforation or hemoperitoneum on the imaging study. Dr. Barry Dienes of surgery was consulted from the emergency department and saw the patient, recommended Gi consultation and possible EGD today. GI consult from [facility] requested. Of note his hemoccult was negative.  Assessment/Plan: GOO,  Large ulcer int he stomach distal to the anastomosis: PPI, advance diet as tolerated. He was on soft diet last night and devloped some vomiting last night, diet changed to full liquid diet and monitor liquid diet for a couple of days before advancing to soft diet.  Add nutritional supplements per nutrition.  Pt currently denies any nausea or vomiting.  Surgery consutled and no surgical intervention at this time.    Hypothyroidism: Resume synthroid. Free t4 normal. Low free t3, levels, recommend checking the trhyroid panel in 4 weeks and make changes in synthroid.    Stage 3 CKD: STABLE to improving with hydration.    Anemia of chronic disease: Stable after the 2 unit s of prbc trasnfusion.  Hemoglobin is around 9 today.    Code Status: full code.  Family Communication: family  at bedside this am Disposition Plan: pending, possibly home when able to toelrate soft diet.     Consultants:  Gastroenterology  Surgery.   Procedures:  EGD.  Antibiotics:  none  HPI/Subjective: Vomiting last night. Objective: Filed Vitals:   08/15/15 0602 08/15/15 1112  BP: 167/86 111/64  Pulse: 93 112  Temp: 98.8 F (37.1 C) 97.7 F (36.5 C)  Resp: 18 18    Intake/Output Summary (Last 24 hours) at 08/15/15 1321 Last data filed at 08/15/15 0000  Gross per 24 hour  Intake    120 ml  Output      0 ml  Net    120 ml   Filed Weights   08/11/15 2157 08/12/15 2052 08/13/15 2044  Weight: 55.475 kg (122 lb 4.8 oz) 51.256 kg (113 lb) 51.665 kg (113 lb 14.4 oz)    Exam:   General:  Alert comfortable.   Cardiovascular: s1s2  Respiratory: ctab  Abdomen:soft NT ndbs+  Musculoskeletal: no pedal edema.   Data Reviewed: Basic Metabolic Panel:  Recent Labs Lab 08/10/15 1359 08/10/15 2304 08/11/15 0600 08/15/15 0803  NA 139  --  136 136  K 4.6  --  4.2 3.9  CL 104  --  106 101  CO2 27  --  22 26  GLUCOSE 116*  --  91 148*  BUN 26*  --  20 10  CREATININE 1.94*  --  1.65* 1.49*  CALCIUM 8.9  --  7.8* 8.3*  MG  --  1.8  --   --    Liver Function Tests:  Recent Labs Lab 08/10/15 1359 08/11/15 0600  AST 20 19  ALT 12* 10*  ALKPHOS 54 44  BILITOT 0.5 0.8  PROT  6.9 5.5*  ALBUMIN 2.3* 1.8*    Recent Labs Lab 08/10/15 1359  LIPASE 31   No results for input(s): AMMONIA in the last 168 hours. CBC:  Recent Labs Lab 08/11/15 0126 08/11/15 0600 08/11/15 0855 08/13/15 1057 08/15/15 0803  WBC 9.6 8.9 9.5 4.5 9.2  HGB 8.0* 8.4* 9.4* 8.9* 9.8*  HCT 25.1* 24.9* 28.1* 27.8* 31.3*  MCV 90.0 88.9 89.5 91.1 91.3  PLT 497* 445* 504* 499* 522*   Cardiac Enzymes: No results for input(s): CKTOTAL, CKMB, CKMBINDEX, TROPONINI in the last 168 hours. BNP (last 3 results)  Recent Labs  08/11/15 0315  BNP 80.1    ProBNP (last 3 results) No results for input(s): PROBNP in the last 8760 hours.  CBG:  Recent Labs Lab 08/11/15 1122  08/12/15 0802 08/13/15 0740 08/14/15 0749 08/15/15 0747  GLUCAP 89 80 105* 95 142*    Recent Results (from the past 240 hour(s))  C difficile quick scan w PCR reflex     Status: None   Collection Time: 08/12/15 12:38 AM  Result Value Ref Range Status   C Diff antigen NEGATIVE NEGATIVE Final   C Diff toxin NEGATIVE NEGATIVE Final   C Diff interpretation Negative for toxigenic C. difficile  Final     Studies: No results found.  Scheduled Meds: . antiseptic oral rinse  7 mL Mouth Rinse q12n4p  . chlorhexidine  15 mL Mouth Rinse BID  . feeding supplement (ENSURE ENLIVE)  237 mL Oral BID BM  . levothyroxine  150 mcg Oral QAC breakfast  . pantoprazole sodium  40 mg Per Tube BID  . sodium chloride  3 mL Intravenous Q12H  . sucralfate  1 g Oral QID   Continuous Infusions:    Principal Problem:   Blood loss anemia Active Problems:   Gastric out let obstruction   Hypothyroidism   Acute kidney injury superimposed on chronic kidney disease (HCC)   PUD (peptic ulcer disease)    Time spent: 25 minutes.     [facility] Pager [phone] If 7PM-7AM, please contact night-coverage at [email or web address], password [facility] 08/15/2015, 1:21 PM  LOS: 5 days

## 2015-08-16 DIAGNOSIS — K279 Peptic ulcer, site unspecified, unspecified as acute or chronic, without hemorrhage or perforation: Secondary | ICD-10-CM

## 2015-08-16 LAB — GLUCOSE, CAPILLARY: Glucose-Capillary: 107 mg/dL — ABNORMAL HIGH (ref 65–99)

## 2015-08-16 LAB — HEMOGLOBIN AND HEMATOCRIT, BLOOD
HEMATOCRIT: 30.4 % — AB (ref 39.0–52.0)
HEMOGLOBIN: 9.6 g/dL — AB (ref 13.0–17.0)

## 2015-08-16 MED ORDER — SODIUM CHLORIDE 0.9 % IV BOLUS (SEPSIS)
500.0000 mL | Freq: Once | INTRAVENOUS | Status: AC
  Administered 2015-08-16: 500 mL via INTRAVENOUS

## 2015-08-16 NOTE — Progress Notes (Signed)
5 Days Post-Op  Subjective: Has been doing better on liquids per his parents.  Objective: Vital signs in last 24 hours: Temp:  [97.7 F (36.5 C)-98.9 F (37.2 C)] 98.5 F (36.9 C) (12/13 0453) Pulse Rate:  [94-115] 94 (12/13 0710) Resp:  [16-19] 18 (12/13 0453) BP: (78-126)/(42-76) 100/63 mmHg (12/13 0710) SpO2:  [98 %-100 %] 98 % (12/13 0453) Weight:  [51.57 kg (113 lb 11.1 oz)] 51.57 kg (113 lb 11.1 oz) (12/12 2021) Last BM Date:  (pt unsure)  Intake/Output from previous day: 12/12 0701 - 12/13 0700 In: 540 [P.O.:540] Out: 0  Intake/Output this shift:    General appearance: cooperative Resp: clear to auscultation bilaterally GI: soft, NT, ND, +BS  Lab Results:   Recent Labs  08/13/15 1057 08/15/15 0803 08/16/15 0750  WBC 4.5 9.2  --   HGB 8.9* 9.8* 9.6*  HCT 27.8* 31.3* 30.4*  PLT 499* 522*  --    BMET  Recent Labs  08/15/15 0803  NA 136  K 3.9  CL 101  CO2 26  GLUCOSE 148*  BUN 10  CREATININE 1.49*  CALCIUM 8.3*   PT/INR No results for input(s): LABPROT, INR in the last 72 hours. ABG No results for input(s): PHART, HCO3 in the last 72 hours.  Invalid input(s): PCO2, PO2  Studies/Results: No results found.  Anti-infectives: Anti-infectives    None      Assessment/Plan: Jejunal ulcer just distal to GJ - GI advancing to soft. I D/W Dr. Michail Sermon.  LOS: 6 days    Khalea Ventura E 08/16/2015

## 2015-08-16 NOTE — Progress Notes (Signed)
Patient ID: Isaac Ramirez, male   DOB: [birth year], 64 y.o.   MRN: [id number] [facility]enter[facility] Progress Note  Isaac Ramirez 64 y.o. 04-21-1951   Subjective: Sleeping comfortably. Family at bedside. Tolerating full liquids. Vomiting 2 days ago on solid food per family.  Objective: Vital signs in last 24 hours: Filed Vitals:   08/16/15 0620 08/16/15 0710  BP: 78/42 100/63  Pulse:  94  Temp:  98.5  Resp:  18    Physical Exam: Gen: sleeping, no acute distress CV: RRR Chest: CTA B Abd: soft, nontender, nondistended, +BS  Lab Results:  Recent Labs  08/15/15 0803  NA 136  K 3.9  CL 101  CO2 26  GLUCOSE 148*  BUN 10  CREATININE 1.49*  CALCIUM 8.3*   No results for input(s): AST, ALT, ALKPHOS, BILITOT, PROT, ALBUMIN in the last 72 hours.  Recent Labs  08/13/15 1057 08/15/15 0803 08/16/15 0750  WBC 4.5 9.2  --   HGB 8.9* 9.8* 9.6*  HCT 27.8* 31.3* 30.4*  MCV 91.1 91.3  --   PLT 499* 522*  --    No results for input(s): LABPROT, INR in the last 72 hours.    Assessment/Plan:  S/P Duodenal ulcer bleed - Hgb stable. On Protonix through PEG tube. Change to soft diet. Supportive care. Check H. Pylori serology and treat if positive.   [place] C. 08/16/2015, 10:13 AM  Pager [phone]  If no answer or after 5 PM call [phone]

## 2015-08-16 NOTE — Progress Notes (Signed)
BP 87/62 this AM.  Rechecked and was 78/42 manually.  Patient asymptomatic.  Baltazar Najjar, NP notified.  New orders received for H&H and for 500 cc bolus.  Administered.  Will continue to monitor.

## 2015-08-16 NOTE — Progress Notes (Signed)
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 He was asked to come to ED, and his repeat Hemoglobin was found to be 7. He received 2 units of prbc transfusion and underwent CT of the abdomen. CT showed diffuse wall thickness and dilation of the Roux limb suggestive of gastric outlet obstruction. There was no evidence of perforation or hemoperitoneum on the imaging study. Dr. Barry Dienes of surgery was consulted from the emergency department and saw the patient, recommended Gi consultation and possible EGD. GI consult from [facility] requested. Of note his hemoccult was negative.  Assessment/Plan: GOO,  Large ulcer in the stomach distal to the anastomosis: PPI, advance diet as tolerated. He was on soft diet on the night of 12/11, and developed some vomiting, diet changed to full liquid diet. He is tolerating full liquid diet without any nausea or vomiting. Plan to advance to soft diet , monitor for a day and d/c home.  Add nutritional supplements per nutrition.  Pt currently denies any nausea or vomiting.  Surgery consutled and no surgical intervention at this time.    Hypothyroidism: Resume synthroid. Free t4 normal. Low free t3, levels, recommend checking the trhyroid panel in 4 weeks and make changes in synthroid.    Stage 3 CKD: STABLE to improving with hydration.    Anemia of chronic disease: Stable after the 2 unit s of prbc trasnfusion.  Hemoglobin is stable.  Mild thrombocytosis;      Code Status: full code.  Family Communication:none at bedside this am Disposition Plan: pending, possibly home when able to  tolerate soft diet.    Consultants:  Gastroenterology  Surgery.   Procedures:  EGD.  Antibiotics:  none  HPI/Subjective: No nausea or vomiting today  Objective: Filed Vitals:   08/16/15 0710 08/16/15 0950  BP: 100/63 111/57  Pulse: 94   Temp:  97.7 F (36.5 C)  Resp:  18    Intake/Output Summary (Last 24 hours) at 08/16/15 1130 Last data filed at 08/16/15 0600  Gross per 24 hour  Intake    540 ml  Output      0 ml  Net    540 ml   Filed Weights   08/12/15 2052 08/13/15 2044 08/15/15 2021  Weight: 51.256 kg (113 lb) 51.665 kg (113 lb 14.4 oz) 51.57 kg (113 lb 11.1 oz)    Exam:   General:  Alert comfortable.   Cardiovascular: s1s2  Respiratory: ctab  Abdomen:soft NT ndbs+  Musculoskeletal: no pedal edema.   Data Reviewed: Basic Metabolic Panel:  Recent Labs Lab 08/10/15 1359 08/10/15 2304 08/11/15 0600 08/15/15 0803  NA 139  --  136 136  K 4.6  --  4.2 3.9  CL 104  --  106 101  CO2 27  --  22 26  GLUCOSE 116*  --  91 148*  BUN 26*  --  20 10  CREATININE 1.94*  --  1.65* 1.49*  CALCIUM 8.9  --  7.8* 8.3*  MG  --  1.8  --   --    Liver Function Tests:  Recent Labs Lab 08/10/15 1359 08/11/15 0600  AST 20 19  ALT  12* 10*  ALKPHOS 54 44  BILITOT 0.5 0.8  PROT 6.9 5.5*  ALBUMIN 2.3* 1.8*    Recent Labs Lab 08/10/15 1359  LIPASE 31   No results for input(s): AMMONIA in the last 168 hours. CBC:  Recent Labs Lab 08/11/15 0126 08/11/15 0600 08/11/15 0855 08/13/15 1057 08/15/15 0803 08/16/15 0750  WBC 9.6 8.9 9.5 4.5 9.2  --   HGB 8.0* 8.4* 9.4* 8.9* 9.8* 9.6*  HCT 25.1* 24.9* 28.1* 27.8* 31.3* 30.4*  MCV 90.0 88.9 89.5 91.1 91.3  --   PLT 497* 445* 504* 499* 522*  --    Cardiac Enzymes: No results for input(s): CKTOTAL, CKMB, CKMBINDEX, TROPONINI in the last 168 hours. BNP (last 3 results)  Recent Labs  08/11/15 0315  BNP 80.1    ProBNP (last 3 results) No results for input(s): PROBNP in the last 8760  hours.  CBG:  Recent Labs Lab 08/12/15 0802 08/13/15 0740 08/14/15 0749 08/15/15 0747 08/16/15 0742  GLUCAP 80 105* 95 142* 107*    Recent Results (from the past 240 hour(s))  C difficile quick scan w PCR reflex     Status: None   Collection Time: 08/12/15 12:38 AM  Result Value Ref Range Status   C Diff antigen NEGATIVE NEGATIVE Final   C Diff toxin NEGATIVE NEGATIVE Final   C Diff interpretation Negative for toxigenic C. difficile  Final     Studies: No results found.  Scheduled Meds: . antiseptic oral rinse  7 mL Mouth Rinse q12n4p  . chlorhexidine  15 mL Mouth Rinse BID  . feeding supplement (ENSURE ENLIVE)  237 mL Oral BID BM  . levothyroxine  150 mcg Oral QAC breakfast  . pantoprazole sodium  40 mg Per Tube BID  . sodium chloride  3 mL Intravenous Q12H  . sucralfate  1 g Oral QID   Continuous Infusions:    Principal Problem:   Blood loss anemia Active Problems:   Gastric out let obstruction   Hypothyroidism   Acute kidney injury superimposed on chronic kidney disease (HCC)   PUD (peptic ulcer disease)    Time spent: 25 minutes.     [facility] Pager [phone] If 7PM-7AM, please contact night-coverage at [email or web address], password [facility] 08/16/2015, 11:30 AM  LOS: 6 days

## 2015-08-16 NOTE — Procedures (Signed)
Pt placed on CPAP with auto mode setting (max-20, min-5) by is comfortably and tolerating well.

## 2015-08-17 ENCOUNTER — Inpatient Hospital Stay (HOSPITAL_COMMUNITY): Payer: Medicare Other

## 2015-08-17 DIAGNOSIS — E039 Hypothyroidism, unspecified: Secondary | ICD-10-CM

## 2015-08-17 DIAGNOSIS — K269 Duodenal ulcer, unspecified as acute or chronic, without hemorrhage or perforation: Secondary | ICD-10-CM

## 2015-08-17 DIAGNOSIS — D72829 Elevated white blood cell count, unspecified: Secondary | ICD-10-CM

## 2015-08-17 LAB — URINALYSIS, ROUTINE W REFLEX MICROSCOPIC
Bilirubin Urine: NEGATIVE
Glucose, UA: NEGATIVE mg/dL
Ketones, ur: NEGATIVE mg/dL
Leukocytes, UA: NEGATIVE
NITRITE: NEGATIVE
PROTEIN: 30 mg/dL — AB
SPECIFIC GRAVITY, URINE: 1.022 (ref 1.005–1.030)
pH: 5.5 (ref 5.0–8.0)

## 2015-08-17 LAB — BASIC METABOLIC PANEL
Anion gap: 10 (ref 5–15)
BUN: 24 mg/dL — AB (ref 6–20)
CO2: 25 mmol/L (ref 22–32)
Calcium: 8.1 mg/dL — ABNORMAL LOW (ref 8.9–10.3)
Chloride: 99 mmol/L — ABNORMAL LOW (ref 101–111)
Creatinine, Ser: 2.13 mg/dL — ABNORMAL HIGH (ref 0.61–1.24)
GFR calc Af Amer: 37 mL/min — ABNORMAL LOW (ref >60)
GFR, EST NON AFRICAN AMERICAN: 32 mL/min — AB (ref >60)
GLUCOSE: 97 mg/dL (ref 65–99)
POTASSIUM: 4 mmol/L (ref 3.5–5.1)
Sodium: 134 mmol/L — ABNORMAL LOW (ref 135–145)

## 2015-08-17 LAB — CBC WITH DIFFERENTIAL/PLATELET
BASOS PCT: 0 %
Basophils Absolute: 0 10*3/uL (ref 0.0–0.1)
EOS ABS: 0 10*3/uL (ref 0.0–0.7)
Eosinophils Relative: 0 %
HCT: 28.9 % — ABNORMAL LOW (ref 39.0–52.0)
Hemoglobin: 9 g/dL — ABNORMAL LOW (ref 13.0–17.0)
LYMPHS ABS: 0.6 10*3/uL — AB (ref 0.7–4.0)
Lymphocytes Relative: 2 %
MCH: 28.5 pg (ref 26.0–34.0)
MCHC: 31.1 g/dL (ref 30.0–36.0)
MCV: 91.5 fL (ref 78.0–100.0)
MONO ABS: 1.5 10*3/uL — AB (ref 0.1–1.0)
Monocytes Relative: 5 %
NEUTROS ABS: 27.3 10*3/uL — AB (ref 1.7–7.7)
NEUTROS PCT: 93 %
PLATELETS: 411 10*3/uL — AB (ref 150–400)
RBC: 3.16 MIL/uL — ABNORMAL LOW (ref 4.22–5.81)
RDW: 16.5 % — AB (ref 11.5–15.5)
WBC: 29.4 10*3/uL — ABNORMAL HIGH (ref 4.0–10.5)

## 2015-08-17 LAB — CBC
HEMATOCRIT: 28.9 % — AB (ref 39.0–52.0)
Hemoglobin: 9.3 g/dL — ABNORMAL LOW (ref 13.0–17.0)
MCH: 29.4 pg (ref 26.0–34.0)
MCHC: 32.2 g/dL (ref 30.0–36.0)
MCV: 91.5 fL (ref 78.0–100.0)
Platelets: 439 10*3/uL — ABNORMAL HIGH (ref 150–400)
RBC: 3.16 MIL/uL — ABNORMAL LOW (ref 4.22–5.81)
RDW: 16.6 % — AB (ref 11.5–15.5)
WBC: 31.1 10*3/uL — ABNORMAL HIGH (ref 4.0–10.5)

## 2015-08-17 LAB — GLUCOSE, CAPILLARY: GLUCOSE-CAPILLARY: 115 mg/dL — AB (ref 65–99)

## 2015-08-17 LAB — URINE MICROSCOPIC-ADD ON

## 2015-08-17 LAB — C DIFFICILE QUICK SCREEN W PCR REFLEX
C DIFFICILE (CDIFF) TOXIN: NEGATIVE
C DIFFICLE (CDIFF) ANTIGEN: NEGATIVE
C Diff interpretation: NEGATIVE

## 2015-08-17 LAB — H. PYLORI ANTIBODY, IGG

## 2015-08-17 MED ORDER — LOPERAMIDE HCL 2 MG PO CAPS
2.0000 mg | ORAL_CAPSULE | Freq: Two times a day (BID) | ORAL | Status: DC
  Administered 2015-08-17: 2 mg via ORAL
  Filled 2015-08-17: qty 1

## 2015-08-17 MED ORDER — LEVOTHYROXINE SODIUM 150 MCG PO TABS
150.0000 ug | ORAL_TABLET | Freq: Every day | ORAL | Status: DC
  Administered 2015-08-17: 150 ug
  Filled 2015-08-17: qty 1

## 2015-08-17 MED ORDER — SODIUM CHLORIDE 0.9 % IV BOLUS (SEPSIS)
500.0000 mL | Freq: Once | INTRAVENOUS | Status: AC
  Administered 2015-08-17: 500 mL via INTRAVENOUS

## 2015-08-17 MED ORDER — SODIUM CHLORIDE 0.9 % IV SOLN
INTRAVENOUS | Status: DC
  Administered 2015-08-17 – 2015-08-21 (×5): via INTRAVENOUS

## 2015-08-17 MED ORDER — PANTOPRAZOLE SODIUM 40 MG PO PACK
40.0000 mg | PACK | Freq: Two times a day (BID) | ORAL | Status: DC
  Administered 2015-08-17 – 2015-08-24 (×14): 40 mg via ORAL
  Filled 2015-08-17 (×14): qty 20

## 2015-08-17 MED ORDER — LEVOTHYROXINE SODIUM 150 MCG PO TABS
150.0000 ug | ORAL_TABLET | Freq: Every day | ORAL | Status: DC
  Administered 2015-08-18 – 2015-08-24 (×7): 150 ug via ORAL
  Filled 2015-08-17 (×7): qty 1

## 2015-08-17 NOTE — Progress Notes (Signed)
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 He was asked to come to ED, and his repeat Hemoglobin was found to be 7. He received 2 units of prbc transfusion and underwent CT of the abdomen. CT showed diffuse wall thickness and dilation of the Roux limb suggestive of gastric outlet obstruction. There was no evidence of perforation or hemoperitoneum on the imaging study. Dr. Barry Dienes of surgery was consulted from the emergency department and saw the patient, recommended Gi consultation and possible EGD. GI consult from [facility] requested. Of note his hemoccult was negative.   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 Patient not tolerating soft diet too well however tolerated full liquid diet. Will change diet to a full liquid diet and continue ensure. GI and general surgery following. Continue PPI.  #2 acute blood loss anemia/anemia of chronic disease Likely secondary to problem #1. Status post 2 units packed red blood cells. Hemoglobin currently stable at 9.0. Follow.  #3 leukocytosis Patient with a significant leukocytosis with a white count of 31.1. Repeat CBC has a white count of 29.4. Patient with soft stools. Patient had prior C. difficile PCR checked which was negative. Check a chest x-ray. Check blood cultures 2. Check a UA with cultures and sensitivities. Repeat C. difficile with PCR. Hold off on any antibiotics at this time until her source is identified. Follow.  #4 acute on chronic kidney disease stage  III Creatinine currently at 2.13 from 1.49 on 08/15/2015. Likely a prerenal azotemia as patient is noted to have borderline hypotension with some emesis and not tolerating good oral intake. Will place on IV fluids. Follow.  #5 hypothyroidism TSH at 8.325. Free T4 within normal limits. Continue current dose of Synthroid. Repeat thyroid function studies in about 4-6 weeks with outpatient follow-up.  #6 mild thrombocytosis Follow.  #7 prophylaxis SCDs for DVT prophylaxis.  Code Status: Full Family Communication: Updated mother, brother, sister at bedside. Disposition Plan: Home once leukocytosis has resolved and hemoglobin remains stable renal function back to baseline.   Consultants:  Gastroenterology: Dr. Watt Climes 08/11/2015  Gen. surgery: Dr. Hulen Skains 7 2016  Procedures:  CT abdomen and pelvis 08/10/2015  Chest x-ray 08/17/2015  Upper endoscopy 08/11/2015  2 units packed red blood cells 08/11/2015, 08/12/2015  Antibiotics:  None  HPI/Subjective: Patient complaining of pain all over. Per family patient noted to have some emesis with soft diet.  Family patient was able to tolerate full liquid diet. Family also stated that patient having multiple soft stools and patient had been on Imodium in the past for this.  Objective: Filed Vitals:   08/17/15 0636 08/17/15 0839  BP: 96/58 90/53  Pulse:  102  Temp:  97.9 F (36.6 C)  Resp:  20    Intake/Output Summary (Last 24 hours) at 08/17/15 1139 Last data filed at 08/17/15 0857  Gross per 24 hour  Intake   1180 ml  Output      8 ml  Net   1172 ml   Filed Weights   08/12/15 2052 08/13/15 2044 08/15/15 2021  Weight: 51.256 kg (  113 lb) 51.665 kg (113 lb 14.4 oz) 51.57 kg (113 lb 11.1 oz)    Exam:   General:  NAD  Cardiovascular: RRR  Respiratory: CTAB  Abdomen: Soft, nontender, nondistended, positive bowel sounds.  Musculoskeletal: No clubbing cyanosis or edema.  Data Reviewed: Basic Metabolic  Panel:  Recent Labs Lab 08/10/15 1359 08/10/15 2304 08/11/15 0600 08/15/15 0803 08/17/15 0523  NA 139  --  136 136 134*  K 4.6  --  4.2 3.9 4.0  CL 104  --  106 101 99*  CO2 27  --  22 26 25   GLUCOSE 116*  --  91 148* 97  BUN 26*  --  20 10 24*  CREATININE 1.94*  --  1.65* 1.49* 2.13*  CALCIUM 8.9  --  7.8* 8.3* 8.1*  MG  --  1.8  --   --   --    Liver Function Tests:  Recent Labs Lab 08/10/15 1359 08/11/15 0600  AST 20 19  ALT 12* 10*  ALKPHOS 54 44  BILITOT 0.5 0.8  PROT 6.9 5.5*  ALBUMIN 2.3* 1.8*    Recent Labs Lab 08/10/15 1359  LIPASE 31   No results for input(s): AMMONIA in the last 168 hours. CBC:  Recent Labs Lab 08/11/15 0855 08/13/15 1057 08/15/15 0803 08/16/15 0750 08/17/15 0523 08/17/15 0835  WBC 9.5 4.5 9.2  --  31.1* 29.4*  NEUTROABS  --   --   --   --   --  27.3*  HGB 9.4* 8.9* 9.8* 9.6* 9.3* 9.0*  HCT 28.1* 27.8* 31.3* 30.4* 28.9* 28.9*  MCV 89.5 91.1 91.3  --  91.5 91.5  PLT 504* 499* 522*  --  439* 411*   Cardiac Enzymes: No results for input(s): CKTOTAL, CKMB, CKMBINDEX, TROPONINI in the last 168 hours. BNP (last 3 results)  Recent Labs  08/11/15 0315  BNP 80.1    ProBNP (last 3 results) No results for input(s): PROBNP in the last 8760 hours.  CBG:  Recent Labs Lab 08/13/15 0740 08/14/15 0749 08/15/15 0747 08/16/15 0742 08/17/15 0732  GLUCAP 105* 95 142* 107* 115*    Recent Results (from the past 240 hour(s))  C difficile quick scan w PCR reflex     Status: None   Collection Time: 08/12/15 12:38 AM  Result Value Ref Range Status   C Diff antigen NEGATIVE NEGATIVE Final   C Diff toxin NEGATIVE NEGATIVE Final   C Diff interpretation Negative for toxigenic C. difficile  Final     Studies: No results found.  Scheduled Meds: . antiseptic oral rinse  7 mL Mouth Rinse q12n4p  . chlorhexidine  15 mL Mouth Rinse BID  . feeding supplement (ENSURE ENLIVE)  237 mL Oral BID BM  . [START ON 08/18/2015]  levothyroxine  150 mcg Oral QAC breakfast  . loperamide  2 mg Oral BID  . pantoprazole sodium  40 mg Per Tube BID  . sodium chloride  3 mL Intravenous Q12H  . sucralfate  1 g Oral QID   Continuous Infusions: . sodium chloride 100 mL/hr at 08/17/15 0745    Principal Problem:   Blood loss anemia Active Problems:   Duodenal ulcer   Gastric out let obstruction   Hypothyroidism   Acute kidney injury superimposed on chronic kidney disease (HCC)   PUD (peptic ulcer disease)   Leukocytosis    Time spent: 52 minutes    Rondalyn Belford M.D. [facility] Pager [phone]. If 7PM-7AM, please contact night-coverage at [email or web address], password [facility]  08/17/2015, 11:39 AM  LOS: 7 days

## 2015-08-17 NOTE — Progress Notes (Signed)
Nutrition Follow-up  DOCUMENTATION CODES:   Not applicable  INTERVENTION:  Continue Ensure Enlive po BID, each supplement provides 350 kcal and 20 grams of protein.  Encourage adequate PO intake.   NUTRITION DIAGNOSIS:   Inadequate oral intake related to altered GI function as evidenced by meal completion < 50%; ongoing  GOAL:   Patient will meet greater than or equal to 90% of their needs; progressing  MONITOR:   PO intake, Supplement acceptance, Diet advancement, Weight trends, Labs, I & O's  REASON FOR ASSESSMENT:    (Per RN request for snacks/supplements)    ASSESSMENT:   64 y.o. 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 Large ulcer noted on EGD just distal to [place] anastomosis with some mild stricturing  Meal completion has been 40-75%. Pt was on a soft diet 12/13 however developed vomiting, this diet has been downgraded to full liquids. Per MD note, more time is need to heal. Pt currently has Ensure ordered and has been consuming them. RD to continue with current orders. Pt encouraged to consume his food at meals and to drink his supplements.   Labs and medications reviewed.   Diet Order:  Diet full liquid Room service appropriate?: Yes; Fluid consistency:: Thin  Skin:  Reviewed, no issues  Last BM:  12/13  Height:   Ht Readings from Last 1 Encounters:  08/11/15 4\' 11"  (1.499 m)    Weight:   Wt Readings from Last 1 Encounters:  08/15/15 113 lb 11.1 oz (51.57 kg)    Ideal Body Weight:  44.5 kg  BMI:  Body mass index is 22.95 kg/(m^2).  Estimated Nutritional Needs:   Kcal:  1500-1800  Protein:  70-80 grams  Fluid:  >/= 1.5 L/day  EDUCATION NEEDS:   No education needs identified at this time  Corrin Parker, MS, RD, LDN Pager # [phone] After hours/ weekend pager # [phone]

## 2015-08-17 NOTE — Progress Notes (Signed)
6 Days Post-Op  Subjective: Comfortable this am Still has a little spitting up with soft diet but continues to handle liquids well  Objective: Vital signs in last 24 hours: Temp:  [97.5 F (36.4 C)-100 F (37.8 C)] 97.9 F (36.6 C) (12/14 0839) Pulse Rate:  [100-110] 102 (12/14 0839) Resp:  [16-20] 20 (12/14 0839) BP: (82-111)/(48-58) 90/53 mmHg (12/14 0839) SpO2:  [96 %-98 %] 97 % (12/14 0839) Last BM Date: 08/16/15  Intake/Output from previous day: 12/13 0701 - 12/14 0700 In: 1080 [P.O.:1080] Out: 10 [Urine:5; Stool:5] Intake/Output this shift:    Abdomen soft, non tender  Lab Results:   Recent Labs  08/15/15 0803 08/16/15 0750 08/17/15 0523  WBC 9.2  --  31.1*  HGB 9.8* 9.6* 9.3*  HCT 31.3* 30.4* 28.9*  PLT 522*  --  439*   BMET  Recent Labs  08/15/15 0803 08/17/15 0523  NA 136 134*  K 3.9 4.0  CL 101 99*  CO2 26 25  GLUCOSE 148* 97  BUN 10 24*  CREATININE 1.49* 2.13*  CALCIUM 8.3* 8.1*   PT/INR No results for input(s): LABPROT, INR in the last 72 hours. ABG No results for input(s): PHART, HCO3 in the last 72 hours.  Invalid input(s): PCO2, PO2  Studies/Results: No results found.  Anti-infectives: Anti-infectives    None      Assessment/Plan: s/p Procedure(s): ESOPHAGOGASTRODUODENOSCOPY (EGD) (N/A)  Partial gastric outlet obstruction from ulcer.  Options remain giving this more time to heal and maintain his nutrition on full liquids and high calorie shakes vs surgery with basically a gastrojejunostomy to bypass the area.  LOS: 7 days    Kiaira Pointer A 08/17/2015

## 2015-08-17 NOTE — Progress Notes (Signed)
BP 85/57 this AM.  Asymptomatic.  BP rechecked manually and was 82/58.  Notified NP on-call.  New orders received for 500 cc bolus and to recheck BP 15 mins afterwards.  Bolus infusing now.  Will continue to monitor.

## 2015-08-18 ENCOUNTER — Inpatient Hospital Stay (HOSPITAL_COMMUNITY): Payer: Medicare Other

## 2015-08-18 DIAGNOSIS — D649 Anemia, unspecified: Secondary | ICD-10-CM

## 2015-08-18 DIAGNOSIS — Q909 Down syndrome, unspecified: Secondary | ICD-10-CM

## 2015-08-18 DIAGNOSIS — R7881 Bacteremia: Secondary | ICD-10-CM

## 2015-08-18 DIAGNOSIS — K283 Acute gastrojejunal ulcer without hemorrhage or perforation: Secondary | ICD-10-CM

## 2015-08-18 LAB — CBC WITH DIFFERENTIAL/PLATELET
BASOS PCT: 0 %
Basophils Absolute: 0 10*3/uL (ref 0.0–0.1)
Eosinophils Absolute: 0.1 10*3/uL (ref 0.0–0.7)
Eosinophils Relative: 0 %
HEMATOCRIT: 24.5 % — AB (ref 39.0–52.0)
Hemoglobin: 7.7 g/dL — ABNORMAL LOW (ref 13.0–17.0)
Lymphocytes Relative: 3 %
Lymphs Abs: 0.6 10*3/uL — ABNORMAL LOW (ref 0.7–4.0)
MCH: 28.9 pg (ref 26.0–34.0)
MCHC: 31.4 g/dL (ref 30.0–36.0)
MCV: 92.1 fL (ref 78.0–100.0)
MONO ABS: 1.5 10*3/uL — AB (ref 0.1–1.0)
MONOS PCT: 7 %
NEUTROS ABS: 20 10*3/uL — AB (ref 1.7–7.7)
Neutrophils Relative %: 90 %
Platelets: 391 10*3/uL (ref 150–400)
RBC: 2.66 MIL/uL — ABNORMAL LOW (ref 4.22–5.81)
RDW: 16.8 % — AB (ref 11.5–15.5)
WBC: 22.3 10*3/uL — ABNORMAL HIGH (ref 4.0–10.5)

## 2015-08-18 LAB — BASIC METABOLIC PANEL
Anion gap: 9 (ref 5–15)
BUN: 24 mg/dL — AB (ref 6–20)
CALCIUM: 7.6 mg/dL — AB (ref 8.9–10.3)
CO2: 20 mmol/L — AB (ref 22–32)
CREATININE: 1.93 mg/dL — AB (ref 0.61–1.24)
Chloride: 111 mmol/L (ref 101–111)
GFR calc non Af Amer: 36 mL/min — ABNORMAL LOW (ref >60)
GFR, EST AFRICAN AMERICAN: 41 mL/min — AB (ref >60)
GLUCOSE: 93 mg/dL (ref 65–99)
Potassium: 3.9 mmol/L (ref 3.5–5.1)
Sodium: 140 mmol/L (ref 135–145)

## 2015-08-18 LAB — GLUCOSE, CAPILLARY: Glucose-Capillary: 100 mg/dL — ABNORMAL HIGH (ref 65–99)

## 2015-08-18 LAB — HEMOGLOBIN AND HEMATOCRIT, BLOOD
HCT: 26.6 % — ABNORMAL LOW (ref 39.0–52.0)
HEMOGLOBIN: 8.4 g/dL — AB (ref 13.0–17.0)

## 2015-08-18 MED ORDER — LOPERAMIDE HCL 2 MG PO CAPS
4.0000 mg | ORAL_CAPSULE | Freq: Once | ORAL | Status: AC
  Administered 2015-08-18: 4 mg via ORAL
  Filled 2015-08-18: qty 2

## 2015-08-18 MED ORDER — VANCOMYCIN HCL IN DEXTROSE 750-5 MG/150ML-% IV SOLN
750.0000 mg | Freq: Once | INTRAVENOUS | Status: AC
  Administered 2015-08-18: 750 mg via INTRAVENOUS
  Filled 2015-08-18: qty 150

## 2015-08-18 MED ORDER — SODIUM CHLORIDE 0.9 % IV BOLUS (SEPSIS)
500.0000 mL | Freq: Once | INTRAVENOUS | Status: AC
  Administered 2015-08-18: 500 mL via INTRAVENOUS

## 2015-08-18 MED ORDER — LOPERAMIDE HCL 2 MG PO CAPS: 2.0000 mg | ORAL_CAPSULE | Freq: Two times a day (BID) | ORAL | Status: DC

## 2015-08-18 MED ORDER — VANCOMYCIN HCL 500 MG IV SOLR
500.0000 mg | INTRAVENOUS | Status: DC
  Administered 2015-08-19 – 2015-08-20 (×2): 500 mg via INTRAVENOUS
  Filled 2015-08-18 (×2): qty 500

## 2015-08-18 MED ORDER — CIPROFLOXACIN IN D5W 400 MG/200ML IV SOLN
400.0000 mg | INTRAVENOUS | Status: DC
  Administered 2015-08-18 – 2015-08-21 (×4): 400 mg via INTRAVENOUS
  Filled 2015-08-18 (×5): qty 200

## 2015-08-18 MED ORDER — LOPERAMIDE HCL 2 MG PO CAPS
2.0000 mg | ORAL_CAPSULE | Freq: Two times a day (BID) | ORAL | Status: DC
  Administered 2015-08-18 – 2015-08-24 (×12): 2 mg via ORAL
  Filled 2015-08-18 (×12): qty 1

## 2015-08-18 NOTE — Evaluation (Signed)
Occupational Therapy Evaluation Patient Details Name: Isaac Ramirez MRN: [id number] DOB: Mar 22, 1951 Today's Date: 08/18/2015    History of Present Illness Pt is a 64 yo male with history of Trisomy 21 presenting with a gastric outlet obstruction, acute blood loss anemia, leukocytosis and acute on chronic CKD.  Pt wears CPAP at night.  Pt now with loose stools.  Hgb 9.0 yesterday adn 7.7 today.  BP 91/52.   Clinical Impression   Pt admitted with the above diagnosis and has the deficits listed below. Pt would benefit from cont OT to increase independence and efficiency with basic adls so he can return home living with his mother and decrease the burden of care on her.      Follow Up Recommendations  Supervision/Assistance - 24 hour;Home health OT    Equipment Recommendations  None recommended by OT    Recommendations for Other Services       Precautions / Restrictions Precautions Precautions: Fall Restrictions Weight Bearing Restrictions: No      Mobility Bed Mobility Overal bed mobility: Needs Assistance Bed Mobility: Supine to Sit     Supine to sit: Min assist     General bed mobility comments: min assist to get trunk into full sitting.  Pt very independent and does well instructing therapist on how to assist.  Transfers Overall transfer level: Needs assistance Equipment used: 1 person hand held assist Transfers: Sit to/from [employer] Transfers Sit to Stand: Min guard Stand pivot transfers: Min assist       General transfer comment: Pt transferred well and was steady on feet.  Hgb is 7.7 so further walking was declined.    Balance Overall balance assessment: Needs assistance Sitting-balance support: Feet supported Sitting balance-Leahy Scale: Good     Standing balance support: Bilateral upper extremity supported;During functional activity Standing balance-Leahy Scale: Fair Standing balance comment: pt could stand for short moments wo assist  but could not accept challenges.                            ADL Overall ADL's : Needs assistance/impaired Eating/Feeding: Set up;Sitting   Grooming: Set up;Cueing for compensatory techniques;Sitting Grooming Details (indicate cue type and reason): pt has a certain way he does things at home.  Because things are not set up exactly as they are at home he does struggle with some adls here that he would not struggle with at home. Upper Body Bathing: Set up;Sitting   Lower Body Bathing: Minimal assistance;Sit to/from stand Lower Body Bathing Details (indicate cue type and reason): min assist to maintain balance in standing and to get to feet. Upper Body Dressing : Set up;Sitting   Lower Body Dressing: Minimal assistance;Sit to/from stand Lower Body Dressing Details (indicate cue type and reason): min assist to maintain standing on his feet and to donn socks and shoes. Toilet Transfer: Min Camera operator and Hygiene: Total assistance;Sit to/from stand Toileting - Clothing Manipulation Details (indicate cue type and reason): pt with significant diarrhea when mobilizing.  Pt stood while therapist cleaned pt in standing.     Functional mobility during ADLs: Minimal assistance General ADL Comments: Pt does fairly well with basic adls. Pt is weak overall and needing more assist than normal for toileting and LE dressing.  Feel once pt's diarrhea slows, he will be more independent up on his feet and with adls.     Vision     Agricultural engineer  Tested?: No   Praxis      Pertinent Vitals/Pain Pain Assessment: No/denies pain     Hand Dominance Right   Extremity/Trunk Assessment Upper Extremity Assessment Upper Extremity Assessment: Generalized weakness   Lower Extremity Assessment Lower Extremity Assessment: Defer to PT evaluation       Communication Communication Communication: No difficulties   Cognition  Arousal/Alertness: Awake/alert Behavior During Therapy: WFL for tasks assessed/performed Overall Cognitive Status: History of cognitive impairments - at baseline                     General Comments       Exercises       Shoulder Instructions      Home Living Family/patient expects to be discharged to:: Private residence Living Arrangements: Parent Available Help at Discharge: Available 24 hours/day Type of Home: House Home Access: Stairs to enter [employer] of Steps: 2 Entrance Stairs-Rails: Can reach both;Right;Left Home Layout: One level     Bathroom Shower/Tub: Tub/shower unit;Curtain Shower/tub characteristics: Architectural technologist: Standard     Home Equipment: Civil engineer, contracting          Prior Functioning/Environment Level of Independence: Needs assistance  Gait / Transfers Assistance Needed: usually walks [place] w/o device. ADL's / Homemaking Assistance Needed: mother cooks and does dishes.  Pt cleans and has chores around house he has to do.  They take care of each other.  Pt's sister and her husband live next door.        OT Diagnosis: Generalized weakness;Cognitive deficits;Disturbance of vision   OT Problem List: Decreased strength;Impaired balance (sitting and/or standing);Decreased cognition;Impaired vision/perception;Decreased knowledge of use of DME or AE   OT Treatment/Interventions: Self-care/ADL training;Therapeutic activities;DME and/or AE instruction    OT Goals(Current goals can be found in the care plan section) Acute Rehab OT Goals Patient Stated Goal: to get strong OT Goal Formulation: With patient Time For Goal Achievement: 09/01/15 Potential to Achieve Goals: Good ADL Goals Pt Will Perform Grooming: with supervision;standing Pt Will Perform Lower Body Bathing: with supervision;sit to/from stand Pt Will Perform Lower Body Dressing: with supervision;sit to/from stand Pt Will Perform Tub/Shower Transfer: Tub transfer;with  supervision;ambulating;shower seat Additional ADL Goal #1: Pt will walk to bathroom and toilet with S.  OT Frequency: Min 2X/week   Barriers to D/C:    very supportive family       Co-evaluation              End of Session Nurse Communication: Mobility status  Activity Tolerance: Patient tolerated treatment well Patient left: in chair;with call bell/phone within reach;with family/visitor present   Time: 1010-1045 OT Time Calculation (min): 35 min Charges:  OT General Charges $OT Visit: 1 Procedure OT Evaluation $Initial OT Evaluation Tier I: 1 Procedure OT Treatments $Self Care/Home Management : 8-22 mins G-Codes:    Glenford Peers 08/31/15, 11:03 AM  [id number]

## 2015-08-18 NOTE — Progress Notes (Addendum)
ADDENDUM:  AM labs returned; SCr 2.13>1.93.   Plan: Vancomycin 750mg  x1 followed by 500mg  q24h   ANTIBIOTIC CONSULT NOTE - INITIAL  Pharmacy Consult for vancomycin Indication: Possible staph aureus bacteremia  Allergies  Allergen Reactions  . Penicillins Anaphylaxis    Has patient had a PCN reaction causing immediate rash, facial/tongue/throat swelling, SOB or lightheadedness with hypotension: Yes Has patient had a PCN reaction causing severe rash involving mucus membranes or skin necrosis: No Has patient had a PCN reaction that required hospitalization No Has patient had a PCN reaction occurring within the last 10 years: No If all of the above answers are "NO", then may proceed with Cephalosporin use.  . Sulfa Antibiotics     Patient Measurements: Height: 4\' 11"  (149.9 cm) Weight: 111 lb 4.8 oz (50.485 kg) IBW/kg (Calculated) : 47.7 Adjusted Body Weight: N/A  Vital Signs: Temp: 99.3 F (37.4 C) (12/15 0744) Temp Source: Oral (12/15 0744) BP: 93/53 mmHg (12/15 0744) Pulse Rate: 92 (12/15 0744) Intake/Output from previous day: 12/14 0701 - 12/15 0700 In: 3168.8 [P.O.:700; I.V.:1968.8; IV Piggyback:500] Out: 455 [Urine:450; Stool:5] Intake/Output from this shift:    Labs:  Recent Labs  08/17/15 0523 08/17/15 0835 08/18/15 0655  WBC 31.1* 29.4* 22.3*  HGB 9.3* 9.0* 7.7*  PLT 439* 411* 391  CREATININE 2.13*  --   --    Estimated Creatinine Clearance: 24.3 mL/min (by C-G formula based on Cr of 2.13). No results for input(s): VANCOTROUGH, VANCOPEAK, VANCORANDOM, GENTTROUGH, GENTPEAK, GENTRANDOM, TOBRATROUGH, TOBRAPEAK, TOBRARND, AMIKACINPEAK, AMIKACINTROU, AMIKACIN in the last 72 hours.   Microbiology: Recent Results (from the past 720 hour(s))  C difficile quick scan w PCR reflex     Status: None   Collection Time: 08/12/15 12:38 AM  Result Value Ref Range Status   C Diff antigen NEGATIVE NEGATIVE Final   C Diff toxin NEGATIVE NEGATIVE Final   C Diff  interpretation Negative for toxigenic C. difficile  Final  Culture, blood (routine x 2)     Status: None (Preliminary result)   Collection Time: 08/17/15 12:12 PM  Result Value Ref Range Status   Specimen Description BLOOD RIGHT HAND  Final   Special Requests BOTTLES DRAWN AEROBIC ONLY 3CCS  Final   Culture  Setup Time   Final    GRAM POSITIVE COCCI IN CLUSTERS AEROBIC BOTTLE ONLY CRITICAL RESULT CALLED TO, READ BACK BY AND VERIFIED WITH: J. NARAMBAS,RN AT 0756 ON [id number] BY Rhea Bleacher    Culture PENDING  Incomplete   Report Status PENDING  Incomplete  C difficile quick scan w PCR reflex     Status: None   Collection Time: 08/17/15  2:09 PM  Result Value Ref Range Status   C Diff antigen NEGATIVE NEGATIVE Final   C Diff toxin NEGATIVE NEGATIVE Final   C Diff interpretation Negative for toxigenic C. difficile  Final    Medical History: Past Medical History  Diagnosis Date  . PUD (peptic ulcer disease)   . Hypothyroidism   . Trisomy 21     Medications:  Scheduled:  . antiseptic oral rinse  7 mL Mouth Rinse q12n4p  . chlorhexidine  15 mL Mouth Rinse BID  . feeding supplement (ENSURE ENLIVE)  237 mL Oral BID BM  . levothyroxine  150 mcg Oral QAC breakfast  . loperamide  2 mg Oral BID  . pantoprazole sodium  40 mg Oral BID  . sodium chloride  3 mL Intravenous Q12H  . sucralfate  1 g Oral QID  .  vancomycin  750 mg Intravenous Once   Assessment: Pharmacy consulted to dose vancomycin for possible staph aureus bacteremia. 1/2 BCx growing GPC in clusters. Tmax 99.7, WBC 31.1>29.4. SCr 1.49>2.13. Labs in progress for 12/15. Will give vanc dose x1 and re-evaluate pending results of BMP  Vanc 12/15>>  12/14 BCx: 1/2 GPC clusters 12/14 UCx: pending 12/14 C.diff PCR: neg  Goal of Therapy:  Vancomycin trough level 15-20 mcg/ml  Plan:  Vancomycin 750mg  x1 Follow up results of 12/15 BMP to determine further dosing  Measure antibiotic drug levels at steady state Follow up  culture results  Monitor renal fcn, LOT, clinical status  Judieth Keens, PharmD Clinical Pharmacy Resident 08/18/2015,8:37 AM

## 2015-08-18 NOTE — Progress Notes (Signed)
RT note: Pt. placed on CPAP@ auto mode setting,(max-20/min-5)for evening, Mother @ bedside, RT to monitor.

## 2015-08-18 NOTE — Progress Notes (Signed)
[name] has declined CPAP use tonight. RT informed her to let us know if at any time they change their minds. RT will continue to monitor.

## 2015-08-18 NOTE — Progress Notes (Signed)
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 He was asked to come to ED, and his repeat Hemoglobin was found to be 7. He received 2 units of prbc transfusion and underwent CT of the abdomen. CT showed diffuse wall thickness and dilation of the Roux limb suggestive of gastric outlet obstruction. There was no evidence of perforation or hemoperitoneum on the imaging study. Dr. Barry Dienes of surgery was consulted from the emergency department and saw the patient, recommended Gi consultation and possible EGD. GI consult from [facility] requested. Of note his hemoccult was negative.   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 Patient not tolerating soft diet too well yesterday and was changed back to full liquid diet. Patient currently tolerating full liquid diet with ensure. GI and general surgery following. Continue PPI.  #2 acute blood loss anemia/anemia of chronic disease Likely secondary to problem #1. Status post 2 units packed red blood cells. Hemoglobin currently stable at 7.7 from 9.0. Likely dilutional component. Transfusion threshold hemoglobin less than 7. Follow H&H.   #3 leukocytosis Patient with a significant leukocytosis with a white count of 31.1 yesterday. Repeat CBC has a white count of 29.4. WBC currently at 22.3. Patient with soft stools loose stools.. Patient had prior C. difficile PCR checked which was negative. Repeat chest x-ray negative for any acute infiltrate. Repeat C. difficile PCR negative. Urinalysis is  nitrite negative leukocytes negative. Preliminary blood cultures with gram-positive cocci in clusters. Will place patient empirically on IV vancomycin while awaiting final results.   #4 bacteremia gram-positive Preliminary blood cultures obtained yesterday due to significant leukocytosis with gram-positive cocci in clusters. Will place empirically on IV vancomycin. ID consultation.  #5 acute on chronic kidney disease stage III Creatinine trending back down with hydration and currently at 1.93 from 2.13 from 1.49 on 08/15/2015. Likely a prerenal azotemia as patient is noted to have borderline hypotension with some emesis and not tolerating good oral intake. Continue gentle hydration.  Follow.  #6 hypothyroidism TSH at 8.325. Free T4 within normal limits. Continue current dose of Synthroid. Repeat thyroid function studies in about 4-6 weeks with outpatient follow-up.  #7 mild thrombocytosis Follow.  #8 prophylaxis SCDs for DVT prophylaxis.  Code Status: Full Family Communication: Updated mother, brother at bedside. Disposition Plan: Home once leukocytosis has resolved and hemoglobin remains stable renal function back to baseline.   Consultants:  Gastroenterology: Dr. Watt Climes 08/11/2015  Gen. surgery: Dr. Hulen Skains 7 2016  Infectious disease Dr. Johnnye Sima pending  Procedures:  CT abdomen and pelvis 08/10/2015  Chest x-ray 08/17/2015  Upper endoscopy 08/11/2015  2 units packed red blood cells 08/11/2015, 08/12/2015  Antibiotics:  IV vancomycin 08/18/2015  HPI/Subjective: Patient sitting up in chair. Patient feeling better. Patient complaining of multiple loose stools. Patient's family states that on admission on initial blood draw phlebotomist may have contaminated specimen as needle was initially placed on the table while vein was being found.  Objective: Filed Vitals:   08/18/15 0433 08/18/15 0744  BP: 87/55 93/53  Pulse: 95 92  Temp: 98.2 F (36.8 C) 99.3 F (37.4  C)   Resp: 18 18    Intake/Output Summary (Last 24 hours) at 08/18/15 1156 Last data filed at 08/18/15 0930  Gross per 24 hour  Intake 3068.75 ml  Output    352 ml  Net 2716.75 ml   Filed Weights   08/13/15 2044 08/15/15 2021 08/17/15 1923  Weight: 51.665 kg (113 lb 14.4 oz) 51.57 kg (113 lb 11.1 oz) 50.485 kg (111 lb 4.8 oz)    Exam:   General:  NAD  Cardiovascular: RRR  Respiratory: CTAB  Abdomen: Soft, nontender, nondistended, positive bowel sounds.  Musculoskeletal: No clubbing cyanosis or edema.  Data Reviewed: Basic Metabolic Panel:  Recent Labs Lab 08/15/15 0803 08/17/15 0523 08/18/15 0655  NA 136 134* 140  K 3.9 4.0 3.9  CL 101 99* 111  CO2 26 25 20*  GLUCOSE 148* 97 93  BUN 10 24* 24*  CREATININE 1.49* 2.13* 1.93*  CALCIUM 8.3* 8.1* 7.6*   Liver Function Tests: No results for input(s): AST, ALT, ALKPHOS, BILITOT, PROT, ALBUMIN in the last 168 hours. No results for input(s): LIPASE, AMYLASE in the last 168 hours. No results for input(s): AMMONIA in the last 168 hours. CBC:  Recent Labs Lab 08/13/15 1057 08/15/15 0803 08/16/15 0750 08/17/15 0523 08/17/15 0835 08/18/15 0655  WBC 4.5 9.2  --  31.1* 29.4* 22.3*  NEUTROABS  --   --   --   --  27.3* 20.0*  HGB 8.9* 9.8* 9.6* 9.3* 9.0* 7.7*  HCT 27.8* 31.3* 30.4* 28.9* 28.9* 24.5*  MCV 91.1 91.3  --  91.5 91.5 92.1  PLT 499* 522*  --  439* 411* 391   Cardiac Enzymes: No results for input(s): CKTOTAL, CKMB, CKMBINDEX, TROPONINI in the last 168 hours. BNP (last 3 results)  Recent Labs  08/11/15 0315  BNP 80.1    ProBNP (last 3 results) No results for input(s): PROBNP in the last 8760 hours.  CBG:  Recent Labs Lab 08/14/15 0749 08/15/15 0747 08/16/15 0742 08/17/15 0732 08/18/15 0744  GLUCAP 95 142* 107* 115* 100*    Recent Results (from the past 240 hour(s))  C difficile quick scan w PCR reflex     Status: None   Collection Time: 08/12/15 12:38 AM  Result Value Ref Range Status    C Diff antigen NEGATIVE NEGATIVE Final   C Diff toxin NEGATIVE NEGATIVE Final   C Diff interpretation Negative for toxigenic C. difficile  Final  Culture, Urine     Status: None (Preliminary result)   Collection Time: 08/17/15 11:50 AM  Result Value Ref Range Status   Specimen Description URINE, CLEAN CATCH  Final   Special Requests NONE  Final   Culture 70,000 COLONIES/ml GRAM NEGATIVE RODS  Final   Report Status PENDING  Incomplete  Culture, blood (routine x 2)     Status: None (Preliminary result)   Collection Time: 08/17/15 12:12 PM  Result Value Ref Range Status   Specimen Description BLOOD RIGHT HAND  Final   Special Requests BOTTLES DRAWN AEROBIC ONLY 3CCS  Final   Culture  Setup Time   Final    GRAM POSITIVE COCCI IN CLUSTERS AEROBIC BOTTLE ONLY CRITICAL RESULT CALLED TO, READ BACK BY AND VERIFIED WITH: J. NARAMBAS,RN AT [id number] ON [id number] BY Rhea Bleacher    Culture TOO YOUNG TO READ  Final   Report Status PENDING  Incomplete  C difficile quick scan w PCR reflex     Status: None   Collection Time: 08/17/15  2:09 PM  Result Value Ref Range Status   C Diff antigen NEGATIVE NEGATIVE Final   C Diff toxin NEGATIVE NEGATIVE Final   C Diff interpretation Negative for toxigenic C. difficile  Final     Studies: Dg Chest Port 1 View  08/17/2015  CLINICAL DATA:  Fever today.  Initial encounter. EXAM: PORTABLE CHEST 1 VIEW COMPARISON:  PA and lateral chest 01/18/2006. FINDINGS: The lungs are clear. Heart size is normal. No pneumothorax or pleural effusion. Marked degenerative change about the shoulders is noted. IMPRESSION: No acute disease. Electronically Signed   By: Inge Rise M.D.   On: 08/17/2015 12:29    Scheduled Meds: . antiseptic oral rinse  7 mL Mouth Rinse q12n4p  . chlorhexidine  15 mL Mouth Rinse BID  . feeding supplement (ENSURE ENLIVE)  237 mL Oral BID BM  . levothyroxine  150 mcg Oral QAC breakfast  . loperamide  2 mg Oral BID  . pantoprazole sodium  40 mg  Oral BID  . sodium chloride  3 mL Intravenous Q12H  . sucralfate  1 g Oral QID  . [START ON 08/19/2015] vancomycin  500 mg Intravenous Q24H   Continuous Infusions: . sodium chloride 75 mL/hr at 08/17/15 1945    Principal Problem:   Blood loss anemia Active Problems:   Duodenal ulcer   Bacteremia   Gastric out let obstruction   Hypothyroidism   Acute kidney injury superimposed on chronic kidney disease (HCC)   PUD (peptic ulcer disease)   Leukocytosis   Gastric outlet obstruction    Time spent: 70 minutes    Alyan Hartline M.D. [facility] Pager [phone]. If 7PM-7AM, please contact night-coverage at [email or web address], password [facility] 08/18/2015, 11:56 AM  LOS: 8 days

## 2015-08-18 NOTE — Progress Notes (Signed)
Pt having increase in frequency of loose stools. Family member at bedside requesting imodium. Roger Shelter, NP notified. No new orders received at this time. Will continue to monitor.

## 2015-08-18 NOTE — Progress Notes (Signed)
CT staff wanted the patient to start the contrast at around 6:45 pm, given to the patient at 6:50 pm one bottle of 66ml Omnipaque in 325 ml.  Doctor aware.

## 2015-08-18 NOTE — Progress Notes (Signed)
Patient ID: LEONID BAISE, male   DOB: 01-May-1951, 64 y.o.   MRN: [id number] 7 Days Post-Op  Subjective: Patient doing better on full liquids.  Denies nausea.  Having lots of diarrhea.  Patient was on imodium daily at home for over 20 yrs according to his mom  Objective: Vital signs in last 24 hours: Temp:  [98.2 F (36.8 C)-99.7 F (37.6 C)] 99.3 F (37.4 C) (12/15 0744) Pulse Rate:  [92-115] 92 (12/15 0744) Resp:  [16-20] 18 (12/15 0744) BP: (87-106)/(53-67) 93/53 mmHg (12/15 0744) SpO2:  [97 %-98 %] 97 % (12/15 0744) Weight:  [50.485 kg (111 lb 4.8 oz)] 50.485 kg (111 lb 4.8 oz) (12/14 1923) Last BM Date: 08/18/15  Intake/Output from previous day: 12/14 0701 - 12/15 0700 In: 3168.8 [P.O.:700; I.V.:1968.8; IV Piggyback:500] Out: 455 [Urine:450; Stool:5] Intake/Output this shift: Total I/O In: 240 [P.O.:240] Out: 0   PE: Abd: soft, bloated, but nontender, +BS Heart: regular Lungs: CTAB  Lab Results:   Recent Labs  08/17/15 0835 08/18/15 0655  WBC 29.4* 22.3*  HGB 9.0* 7.7*  HCT 28.9* 24.5*  PLT 411* 391   BMET  Recent Labs  08/17/15 0523 08/18/15 0655  NA 134* 140  K 4.0 3.9  CL 99* 111  CO2 25 20*  GLUCOSE 97 93  BUN 24* 24*  CREATININE 2.13* 1.93*  CALCIUM 8.1* 7.6*   PT/INR No results for input(s): LABPROT, INR in the last 72 hours. CMP     Component Value Date/Time   NA 140 08/18/2015 0655   K 3.9 08/18/2015 0655   CL 111 08/18/2015 0655   CO2 20* 08/18/2015 0655   GLUCOSE 93 08/18/2015 0655   BUN 24* 08/18/2015 0655   CREATININE 1.93* 08/18/2015 0655   CALCIUM 7.6* 08/18/2015 0655   PROT 5.5* 08/11/2015 0600   ALBUMIN 1.8* 08/11/2015 0600   AST 19 08/11/2015 0600   ALT 10* 08/11/2015 0600   ALKPHOS 44 08/11/2015 0600   BILITOT 0.8 08/11/2015 0600   GFRNONAA 36* 08/18/2015 0655   GFRAA 41* 08/18/2015 0655   Lipase     Component Value Date/Time   LIPASE 31 08/10/2015 1359       Studies/Results: Dg Chest Port 1  View  08/17/2015  CLINICAL DATA:  Fever today.  Initial encounter. EXAM: PORTABLE CHEST 1 VIEW COMPARISON:  PA and lateral chest 01/18/2006. FINDINGS: The lungs are clear. Heart size is normal. No pneumothorax or pleural effusion. Marked degenerative change about the shoulders is noted. IMPRESSION: No acute disease. Electronically Signed   By: Inge Rise M.D.   On: 08/17/2015 12:29    Anti-infectives: Anti-infectives    Start     Dose/Rate Route Frequency Ordered Stop   08/19/15 1100  vancomycin (VANCOCIN) 500 mg in sodium chloride 0.9 % 100 mL IVPB     500 mg 100 mL/hr over 60 Minutes Intravenous Every 24 hours 08/18/15 1005     08/18/15 0845  vancomycin (VANCOCIN) IVPB 750 mg/150 ml premix     750 mg 150 mL/hr over 60 Minutes Intravenous  Once 08/18/15 0835         Assessment/Plan  Jejunal ulcer distal to [place] -cont on full liquids and protein supplements.  Try to treat conservatively.  He is tolerating this well right now.  If he can continue to take in nutrition this way then we would like to avoid surgery.  If he fails conservative management he may need an operation Gram + bacteremia -source unknown right now.  Doubt GI given gram + in nature Anemia -started back on fluids yesterday.  Some drop may be dilutional, but would follow closely.  D/W primary service.  LOS: 8 days    Marc Sivertsen E 08/18/2015, 11:28 AM Pager: [phone]

## 2015-08-18 NOTE — Evaluation (Signed)
Physical Therapy Evaluation Patient Details Name: Isaac Ramirez MRN: [id number] DOB: Jan 22, 1951 Today's Date: 08/18/2015   History of Present Illness  Pt is a 64 yo male with history of Trisomy 52 presenting with a gastric outlet obstruction, acute blood loss anemia, leukocytosis and acute on chronic CKD.  Pt wears CPAP at night.  Pt now with loose stools.  Hgb 9.0 yesterday adn 7.7 today.   Clinical Impression  Pt admitted with above complications. Pt currently with functional limitations due to the deficits listed below (see PT Problem List). Previously independent without an assistive device, today required a rolling walker and hand held assist to safely ambulate. Eager to work with therapy. Strong family support who report they are concerned with his generalized weakness. Pt will benefit from skilled PT to increase their independence and safety with mobility to allow discharge to the venue listed below.       Follow Up Recommendations Home health PT;Supervision for mobility/OOB    Equipment Recommendations  None recommended by PT    Recommendations for Other Services       Precautions / Restrictions Precautions Precautions: Fall Restrictions Weight Bearing Restrictions: No      Mobility  Bed Mobility Overal bed mobility: Needs Assistance Bed Mobility: Supine to Sit     Supine to sit: Min assist     General bed mobility comments: sitting in recliner  Transfers Overall transfer level: Needs assistance Equipment used: Rolling walker (2 wheeled) Transfers: Sit to/from Stand Sit to Stand: Supervision Stand pivot transfers: Min assist       General transfer comment: good power up. VC for hand placement. Stable once upright holding RW for support.  Ambulation/Gait Ambulation/Gait assistance: Min assist Ambulation Distance (Feet): 160 Feet Assistive device: Rolling walker (2 wheeled);1 person hand held assist Gait Pattern/deviations: Step-through  pattern;Decreased stride length (RLE externally rotated > LLT) Gait velocity: decreased Gait velocity interpretation: Below normal speed for age/gender General Gait Details: Educated on safe DME use with a rolling walker, good control with this device, occasional cues for avoidance of objects in hallways. No overt loss of balance. Min assist for hand held support without RW. More guarded without device, small steps.  Stairs            Wheelchair Mobility    Modified Rankin (Stroke Patients Only)       Balance Overall balance assessment: Needs assistance Sitting-balance support: No upper extremity supported Sitting balance-Leahy Scale: Good     Standing balance support: No upper extremity supported Standing balance-Leahy Scale: Fair Standing balance comment: pt could stand for short moments wo assist but could not accept challenges.                             Pertinent Vitals/Pain Pain Assessment: No/denies pain    Home Living Family/patient expects to be discharged to:: Private residence Living Arrangements: Parent Available Help at Discharge: Available 24 hours/day Type of Home: House Home Access: Stairs to enter Entrance Stairs-Rails: Can reach both;Right;Left Entrance Stairs-Number of Steps: 2 Home Layout: One level Home Equipment: Clinical cytogeneticist - 2 wheels      Prior Function Level of Independence: Needs assistance   Gait / Transfers Assistance Needed: usually walks w/o device.  ADL's / Homemaking Assistance Needed: mother cooks and does dishes.  Pt cleans and has chores around house he has to do.  They take care of each other.  Pt's sister and her husband live next door.  Hand Dominance   Dominant Hand: Right    Extremity/Trunk Assessment   Upper Extremity Assessment: Defer to OT evaluation           Lower Extremity Assessment: Overall WFL for tasks assessed         Communication   Communication: No difficulties   Cognition Arousal/Alertness: Awake/alert Behavior During Therapy: WFL for tasks assessed/performed Overall Cognitive Status: History of cognitive impairments - at baseline                      General Comments General comments (skin integrity, edema, etc.): Mother present, very supportive.     Exercises        Assessment/Plan    PT Assessment Patient needs continued PT services  PT Diagnosis Difficulty walking;Abnormality of gait;Generalized weakness   PT Problem List Decreased strength;Decreased activity tolerance;Decreased balance;Decreased mobility;Decreased knowledge of use of DME  PT Treatment Interventions DME instruction;Gait training;Stair training;Functional mobility training;Therapeutic activities;Therapeutic exercise;Balance training;Patient/family education   PT Goals (Current goals can be found in the Care Plan section) Acute Rehab PT Goals Patient Stated Goal: to get strong PT Goal Formulation: With patient Time For Goal Achievement: 09/01/15 Potential to Achieve Goals: Good    Frequency Min 3X/week   Barriers to discharge        Co-evaluation               End of Session   Activity Tolerance: Patient tolerated treatment well Patient left: with nursing/sitter in room;with family/visitor present (with nursing in restroom) Nurse Communication: Mobility status         Time: 1203-1223 PT Time Calculation (min) (ACUTE ONLY): 20 min   Charges:   PT Evaluation $Initial PT Evaluation Tier I: 1 Procedure     PT G CodesEllouise Newer 08/18/2015, 1:07 PM Elayne Snare, [place]

## 2015-08-18 NOTE — Care Management Important Message (Signed)
Important Message  Patient Details  Name: Isaac Ramirez MRN: [id number] Date of Birth: [birth year]   Medicare Important Message Given:  Yes    Louanne Belton 08/18/2015, 2:27 [place] Message  Patient Details  Name: Isaac Ramirez MRN: [id number] Date of Birth: 1951/09/03   Medicare Important Message Given:  Yes    Ashden Sonnenberg G 08/18/2015, 2:27 PM

## 2015-08-18 NOTE — Consult Note (Addendum)
[facility]  Date of Admission:  08/10/2015  Date of Consult:  08/18/2015  Reason for Consult: Leukocytosis Referring Physician:  Thompson  Impression/Recommendation Leukocytosis +BCx + UCx Would repeat BCx Continue vancomycin Add cipro (? PEN allergy) Repeat CT abd  Gastric Outlet obstruction anemia Down's Syndrome   Comment- He does not appear toxic or to have sob or cough. Aspiration is possible but his CXR is (-).  Await ID of his Cx, not clear either are true (his UA was unremarkable, BCx may be CNS) Would like to re-image his Abd He does not have indwelling line.   Thank you so much for this interesting consult,   Bobby Rumpf (pager) [phone] [email or web address]  Isaac Ramirez is an 64 y.o. male.  HPI: 64 yo M with hx of Down's syndrome, prev PUD and roux-en-Y/vagatomy, comes to ED on 12-7 with weakness, abd pain and HgB 4. He was found in ED to have HgB 7 and on CT to have gastric outlet obstruction. This was confirmed on EGD as well as finding a deep jejunal ulcer with some stricturing.   He did well in hospital and was started on PPI, tolerated advancing diet.  By 12-14 his WBC increased and he began to have emesis.  He had 1 BCx GPC and UCx 70k GNR. UA is negative for Nitr and Leu. C diff (-).  He was started on vancomycin  Past Medical History  Diagnosis Date  . PUD (peptic ulcer disease)   . Hypothyroidism   . Trisomy 21     Past Surgical History  Procedure Laterality Date  . Roux-en-y procedure  2007  . Vagotomy    . Esophagogastroduodenoscopy N/A 08/11/2015    Procedure: ESOPHAGOGASTRODUODENOSCOPY (EGD);  Surgeon: Clarene Essex, MD;  Location: [facility] ENDOSCOPY;  Service: Endoscopy;  Laterality: N/A;     Allergies  Allergen Reactions  . Penicillins Anaphylaxis    Has patient had a PCN reaction causing immediate rash, facial/tongue/throat swelling, SOB or lightheadedness with hypotension: Yes Has patient had  a PCN reaction causing severe rash involving mucus membranes or skin necrosis: No Has patient had a PCN reaction that required hospitalization No Has patient had a PCN reaction occurring within the last 10 years: No If all of the above answers are "NO", then may proceed with Cephalosporin use.  . Sulfa Antibiotics     Medications:  Scheduled: . antiseptic oral rinse  7 mL Mouth Rinse q12n4p  . chlorhexidine  15 mL Mouth Rinse BID  . feeding supplement (ENSURE ENLIVE)  237 mL Oral BID BM  . levothyroxine  150 mcg Oral QAC breakfast  . loperamide  2 mg Oral BID  . pantoprazole sodium  40 mg Oral BID  . sodium chloride  3 mL Intravenous Q12H  . sucralfate  1 g Oral QID  . [START ON 08/19/2015] vancomycin  500 mg Intravenous Q24H    Abtx:  Anti-infectives    Start     Dose/Rate Route Frequency Ordered Stop   08/19/15 1100  vancomycin (VANCOCIN) 500 mg in sodium chloride 0.9 % 100 mL IVPB     500 mg 100 mL/hr over 60 Minutes Intravenous Every 24 hours 08/18/15 1005     08/18/15 0845  vancomycin (VANCOCIN) IVPB 750 mg/150 ml premix     750 mg 150 mL/hr over 60 Minutes Intravenous  Once 08/18/15 0835 08/18/15 1139      Total days of antibiotics: 0 vancomycin  Social History:  reports that he has never smoked. He does not have any smokeless tobacco history on file. He reports that he does not drink alcohol. His drug history is not on file.  Family History  Problem Relation Age of Onset  . Family history unknown: Yes    General ROS: no fever, + loose BM, 12 point ROS o/w (-), see HPI. he does not cough or have SOB during exam.   Blood pressure 93/53, pulse 92, temperature 99.3 F (37.4 C), temperature source Oral, resp. rate 18, height 4' 11"  (1.499 m), weight 50.485 kg (111 lb 4.8 oz), SpO2 97 %. General appearance: alert, cooperative and no distress Eyes: negative findings: conjunctivae and sclerae normal and pupils equal, round, reactive to light and  accomodation Throat: normal findings: no thrush and abnormal findings: dry, poor dentition Neck: no adenopathy and supple, symmetrical, trachea midline Lungs: clear to auscultation bilaterally Chest wall: no tenderness Heart: regular rate and rhythm Abdomen: normal findings: bowel sounds normal and soft, non-tender Extremities: edema none and no UE cordis or erythema. LUE IV is clean. dry skin on feet bu no ulcers.   Mild abd tenderness, mildline hernia, easily reducible.    Results for orders placed or performed during the hospital encounter of 08/10/15 (from the past 48 hour(s))  Basic metabolic panel     Status: Abnormal   Collection Time: 08/17/15  5:23 AM  Result Value Ref Range   Sodium 134 (L) 135 - 145 mmol/L   Potassium 4.0 3.5 - 5.1 mmol/L   Chloride 99 (L) 101 - 111 mmol/L   CO2 25 22 - 32 mmol/L   Glucose, Bld 97 65 - 99 mg/dL   BUN 24 (H) 6 - 20 mg/dL   Creatinine, Ser 2.13 (H) 0.61 - 1.24 mg/dL   Calcium 8.1 (L) 8.9 - 10.3 mg/dL   GFR calc non Af Amer 32 (L) >60 mL/min   GFR calc Af Amer 37 (L) >60 mL/min    Comment: (NOTE) The eGFR has been calculated using the CKD EPI equation. This calculation has not been validated in all clinical situations. eGFR's persistently <60 mL/min signify possible Chronic Kidney Disease.    Anion gap 10 5 - 15  CBC     Status: Abnormal   Collection Time: 08/17/15  5:23 AM  Result Value Ref Range   WBC 31.1 (H) 4.0 - 10.5 K/uL    Comment: REPEATED TO VERIFY   RBC 3.16 (L) 4.22 - 5.81 MIL/uL   Hemoglobin 9.3 (L) 13.0 - 17.0 g/dL   HCT 28.9 (L) 39.0 - 52.0 %   MCV 91.5 78.0 - 100.0 fL   MCH 29.4 26.0 - 34.0 pg   MCHC 32.2 30.0 - 36.0 g/dL   RDW 16.6 (H) 11.5 - 15.5 %   Platelets 439 (H) 150 - 400 K/uL  Glucose, capillary     Status: Abnormal   Collection Time: 08/17/15  7:32 AM  Result Value Ref Range   Glucose-Capillary 115 (H) 65 - 99 mg/dL   Comment 1 Notify RN    Comment 2 Document in Chart   CBC with  Differential/Platelet     Status: Abnormal   Collection Time: 08/17/15  8:35 AM  Result Value Ref Range   WBC 29.4 (H) 4.0 - 10.5 K/uL    Comment: REPEATED TO VERIFY   RBC 3.16 (L) 4.22 - 5.81 MIL/uL   Hemoglobin 9.0 (L) 13.0 - 17.0 g/dL    Comment: REPEATED TO VERIFY  HCT 28.9 (L) 39.0 - 52.0 %   MCV 91.5 78.0 - 100.0 fL   MCH 28.5 26.0 - 34.0 pg   MCHC 31.1 30.0 - 36.0 g/dL   RDW 16.5 (H) 11.5 - 15.5 %   Platelets 411 (H) 150 - 400 K/uL    Comment: REPEATED TO VERIFY   Neutrophils Relative % 93 %   Lymphocytes Relative 2 %   Monocytes Relative 5 %   Eosinophils Relative 0 %   Basophils Relative 0 %   Neutro Abs 27.3 (H) 1.7 - 7.7 K/uL   Lymphs Abs 0.6 (L) 0.7 - 4.0 K/uL   Monocytes Absolute 1.5 (H) 0.1 - 1.0 K/uL   Eosinophils Absolute 0.0 0.0 - 0.7 K/uL   Basophils Absolute 0.0 0.0 - 0.1 K/uL   Smear Review MORPHOLOGY UNREMARKABLE   Urinalysis, Routine w reflex microscopic (not at [facility])     Status: Abnormal   Collection Time: 08/17/15 11:49 AM  Result Value Ref Range   Color, Urine YELLOW YELLOW   APPearance HAZY (A) CLEAR   Specific Gravity, Urine 1.022 1.005 - 1.030   pH 5.5 5.0 - 8.0   Glucose, UA NEGATIVE NEGATIVE mg/dL   Hgb urine dipstick TRACE (A) NEGATIVE   Bilirubin Urine NEGATIVE NEGATIVE   Ketones, ur NEGATIVE NEGATIVE mg/dL   Protein, ur 30 (A) NEGATIVE mg/dL   Nitrite NEGATIVE NEGATIVE   Leukocytes, UA NEGATIVE NEGATIVE  Urine microscopic-add on     Status: Abnormal   Collection Time: 08/17/15 11:49 AM  Result Value Ref Range   Squamous Epithelial / LPF 0-5 (A) NONE SEEN   WBC, UA 0-5 0 - 5 WBC/hpf   RBC / HPF 0-5 0 - 5 RBC/hpf   Bacteria, UA FEW (A) NONE SEEN   Casts GRANULAR CAST (A) NEGATIVE  Culture, Urine     Status: None (Preliminary result)   Collection Time: 08/17/15 11:50 AM  Result Value Ref Range   Specimen Description URINE, CLEAN CATCH    Special Requests NONE    Culture 70,000 COLONIES/ml GRAM NEGATIVE RODS    Report Status PENDING    Culture, blood (routine x 2)     Status: None (Preliminary result)   Collection Time: 08/17/15 12:12 PM  Result Value Ref Range   Specimen Description BLOOD RIGHT HAND    Special Requests BOTTLES DRAWN AEROBIC ONLY 3CCS    Culture  Setup Time      GRAM POSITIVE COCCI IN CLUSTERS AEROBIC BOTTLE ONLY CRITICAL RESULT CALLED TO, READ BACK BY AND VERIFIED WITH: J. NARAMBAS,RN AT [id number] ON [id number] BY Rhea Bleacher    Culture TOO YOUNG TO READ    Report Status PENDING   C difficile quick scan w PCR reflex     Status: None   Collection Time: 08/17/15  2:09 PM  Result Value Ref Range   C Diff antigen NEGATIVE NEGATIVE   C Diff toxin NEGATIVE NEGATIVE   C Diff interpretation Negative for toxigenic C. difficile   CBC with Differential/Platelet     Status: Abnormal   Collection Time: 08/18/15  6:55 AM  Result Value Ref Range   WBC 22.3 (H) 4.0 - 10.5 K/uL   RBC 2.66 (L) 4.22 - 5.81 MIL/uL   Hemoglobin 7.7 (L) 13.0 - 17.0 g/dL   HCT 24.5 (L) 39.0 - 52.0 %   MCV 92.1 78.0 - 100.0 fL   MCH 28.9 26.0 - 34.0 pg   MCHC 31.4 30.0 - 36.0 g/dL   RDW 16.8 (H)  11.5 - 15.5 %   Platelets 391 150 - 400 K/uL   Neutrophils Relative % 90 %   Neutro Abs 20.0 (H) 1.7 - 7.7 K/uL   Lymphocytes Relative 3 %   Lymphs Abs 0.6 (L) 0.7 - 4.0 K/uL   Monocytes Relative 7 %   Monocytes Absolute 1.5 (H) 0.1 - 1.0 K/uL   Eosinophils Relative 0 %   Eosinophils Absolute 0.1 0.0 - 0.7 K/uL   Basophils Relative 0 %   Basophils Absolute 0.0 0.0 - 0.1 K/uL  Basic metabolic panel     Status: Abnormal   Collection Time: 08/18/15  6:55 AM  Result Value Ref Range   Sodium 140 135 - 145 mmol/L   Potassium 3.9 3.5 - 5.1 mmol/L   Chloride 111 101 - 111 mmol/L   CO2 20 (L) 22 - 32 mmol/L   Glucose, Bld 93 65 - 99 mg/dL   BUN 24 (H) 6 - 20 mg/dL   Creatinine, Ser 1.93 (H) 0.61 - 1.24 mg/dL   Calcium 7.6 (L) 8.9 - 10.3 mg/dL   GFR calc non Af Amer 36 (L) >60 mL/min   GFR calc Af Amer 41 (L) >60 mL/min    Comment:  (NOTE) The eGFR has been calculated using the CKD EPI equation. This calculation has not been validated in all clinical situations. eGFR's persistently <60 mL/min signify possible Chronic Kidney Disease.    Anion gap 9 5 - 15  Glucose, capillary     Status: Abnormal   Collection Time: 08/18/15  7:44 AM  Result Value Ref Range   Glucose-Capillary 100 (H) 65 - 99 mg/dL      Component Value Date/Time   SDES BLOOD RIGHT HAND 08/17/2015 1212   SPECREQUEST BOTTLES DRAWN AEROBIC ONLY 3CCS 08/17/2015 1212   CULT TOO YOUNG TO READ 08/17/2015 1212   REPTSTATUS PENDING 08/17/2015 1212   Dg Chest Port 1 View  08/17/2015  CLINICAL DATA:  Fever today.  Initial encounter. EXAM: PORTABLE CHEST 1 VIEW COMPARISON:  PA and lateral chest 01/18/2006. FINDINGS: The lungs are clear. Heart size is normal. No pneumothorax or pleural effusion. Marked degenerative change about the shoulders is noted. IMPRESSION: No acute disease. Electronically Signed   By: Inge Rise M.D.   On: 08/17/2015 12:29   Recent Results (from the past 240 hour(s))  C difficile quick scan w PCR reflex     Status: None   Collection Time: 08/12/15 12:38 AM  Result Value Ref Range Status   C Diff antigen NEGATIVE NEGATIVE Final   C Diff toxin NEGATIVE NEGATIVE Final   C Diff interpretation Negative for toxigenic C. difficile  Final  Culture, Urine     Status: None (Preliminary result)   Collection Time: 08/17/15 11:50 AM  Result Value Ref Range Status   Specimen Description URINE, CLEAN CATCH  Final   Special Requests NONE  Final   Culture 70,000 COLONIES/ml GRAM NEGATIVE RODS  Final   Report Status PENDING  Incomplete  Culture, blood (routine x 2)     Status: None (Preliminary result)   Collection Time: 08/17/15 12:12 PM  Result Value Ref Range Status   Specimen Description BLOOD RIGHT HAND  Final   Special Requests BOTTLES DRAWN AEROBIC ONLY 3CCS  Final   Culture  Setup Time   Final    GRAM POSITIVE COCCI IN  CLUSTERS AEROBIC BOTTLE ONLY CRITICAL RESULT CALLED TO, READ BACK BY AND VERIFIED WITH: J. NARAMBAS,RN AT [id number] ON [id number] BY Rhea Bleacher  Culture TOO YOUNG TO READ  Final   Report Status PENDING  Incomplete  C difficile quick scan w PCR reflex     Status: None   Collection Time: 08/17/15  2:09 PM  Result Value Ref Range Status   C Diff antigen NEGATIVE NEGATIVE Final   C Diff toxin NEGATIVE NEGATIVE Final   C Diff interpretation Negative for toxigenic C. difficile  Final      08/18/2015, 2:10 PM     LOS: 8 days    Records and images were personally reviewed where available.

## 2015-08-19 ENCOUNTER — Inpatient Hospital Stay (HOSPITAL_COMMUNITY): Payer: Medicare Other

## 2015-08-19 DIAGNOSIS — B961 Klebsiella pneumoniae [K. pneumoniae] as the cause of diseases classified elsewhere: Secondary | ICD-10-CM

## 2015-08-19 DIAGNOSIS — K8000 Calculus of gallbladder with acute cholecystitis without obstruction: Secondary | ICD-10-CM | POA: Clinically undetermined

## 2015-08-19 DIAGNOSIS — N3000 Acute cystitis without hematuria: Secondary | ICD-10-CM

## 2015-08-19 LAB — BASIC METABOLIC PANEL
ANION GAP: 6 (ref 5–15)
BUN: 20 mg/dL (ref 6–20)
CALCIUM: 7.7 mg/dL — AB (ref 8.9–10.3)
CO2: 22 mmol/L (ref 22–32)
Chloride: 111 mmol/L (ref 101–111)
Creatinine, Ser: 1.67 mg/dL — ABNORMAL HIGH (ref 0.61–1.24)
GFR, EST AFRICAN AMERICAN: 49 mL/min — AB (ref >60)
GFR, EST NON AFRICAN AMERICAN: 42 mL/min — AB (ref >60)
Glucose, Bld: 92 mg/dL (ref 65–99)
Potassium: 4 mmol/L (ref 3.5–5.1)
Sodium: 139 mmol/L (ref 135–145)

## 2015-08-19 LAB — CBC
HCT: 25.5 % — ABNORMAL LOW (ref 39.0–52.0)
HEMOGLOBIN: 8 g/dL — AB (ref 13.0–17.0)
MCH: 28.4 pg (ref 26.0–34.0)
MCHC: 31.4 g/dL (ref 30.0–36.0)
MCV: 90.4 fL (ref 78.0–100.0)
Platelets: 388 10*3/uL (ref 150–400)
RBC: 2.82 MIL/uL — AB (ref 4.22–5.81)
RDW: 16.8 % — ABNORMAL HIGH (ref 11.5–15.5)
WBC: 12.3 10*3/uL — AB (ref 4.0–10.5)

## 2015-08-19 LAB — URINE CULTURE

## 2015-08-19 LAB — CULTURE, BLOOD (ROUTINE X 2)

## 2015-08-19 LAB — GLUCOSE, CAPILLARY: GLUCOSE-CAPILLARY: 120 mg/dL — AB (ref 65–99)

## 2015-08-19 NOTE — Progress Notes (Signed)
INFECTIOUS DISEASE PROGRESS NOTE  ID: Isaac Ramirez is a 64 y.o. male with  Principal Problem:   Blood loss anemia Active Problems:   Gastric out let obstruction   Hypothyroidism   Acute kidney injury superimposed on chronic kidney disease (HCC)   PUD (peptic ulcer disease)   Leukocytosis   Duodenal ulcer   Gastric outlet obstruction   Bacteremia  Subjective: fatigued  Abtx:  Anti-infectives    Start     Dose/Rate Route Frequency Ordered Stop   08/19/15 1100  vancomycin (VANCOCIN) 500 mg in sodium chloride 0.9 % 100 mL IVPB     500 mg 100 mL/hr over 60 Minutes Intravenous Every 24 hours 08/18/15 1005     08/18/15 1600  ciprofloxacin (CIPRO) IVPB 400 mg     400 mg 200 mL/hr over 60 Minutes Intravenous Every 24 hours 08/18/15 1448     08/18/15 0845  vancomycin (VANCOCIN) IVPB 750 mg/150 ml premix     750 mg 150 mL/hr over 60 Minutes Intravenous  Once 08/18/15 0835 08/18/15 1139      Medications:  Scheduled: . antiseptic oral rinse  7 mL Mouth Rinse q12n4p  . chlorhexidine  15 mL Mouth Rinse BID  . ciprofloxacin  400 mg Intravenous Q24H  . feeding supplement (ENSURE ENLIVE)  237 mL Oral BID BM  . levothyroxine  150 mcg Oral QAC breakfast  . loperamide  2 mg Oral BID  . pantoprazole sodium  40 mg Oral BID  . sodium chloride  3 mL Intravenous Q12H  . sucralfate  1 g Oral QID  . vancomycin  500 mg Intravenous Q24H    Objective: Vital signs in last 24 hours: Temp:  [98.6 F (37 C)-99.4 F (37.4 C)] 99.4 F (37.4 C) (12/16 0826) Pulse Rate:  [92-98] 92 (12/16 0826) Resp:  [14-18] 18 (12/16 0826) BP: (86-97)/(47-60) 97/56 mmHg (12/16 0826) SpO2:  [97 %-98 %] 97 % (12/16 0826) Weight:  [52.617 kg (116 lb)] 52.617 kg (116 lb) (12/15 2120)   General appearance: alert, cooperative, fatigued and no distress Resp: clear to auscultation bilaterally Cardio: regular rate and rhythm GI: abnormal findings:  distended, hypoactive bowel sounds and +murphy's sign,  mild  Lab Results  Recent Labs  08/18/15 0655 08/18/15 1537 08/19/15 0727  WBC 22.3*  --  12.3*  HGB 7.7* 8.4* 8.0*  HCT 24.5* 26.6* 25.5*  NA 140  --  139  K 3.9  --  4.0  CL 111  --  111  CO2 20*  --  22  BUN 24*  --  20  CREATININE 1.93*  --  1.67*   Liver Panel No results for input(s): PROT, ALBUMIN, AST, ALT, ALKPHOS, BILITOT, BILIDIR, IBILI in the last 72 hours. Sedimentation Rate No results for input(s): ESRSEDRATE in the last 72 hours. C-Reactive Protein No results for input(s): CRP in the last 72 hours.  Microbiology: Recent Results (from the past 240 hour(s))  C difficile quick scan w PCR reflex     Status: None   Collection Time: 08/12/15 12:38 AM  Result Value Ref Range Status   C Diff antigen NEGATIVE NEGATIVE Final   C Diff toxin NEGATIVE NEGATIVE Final   C Diff interpretation Negative for toxigenic C. difficile  Final  Culture, Urine     Status: None   Collection Time: 08/17/15 11:50 AM  Result Value Ref Range Status   Specimen Description URINE, CLEAN CATCH  Final   Special Requests NONE  Final   Culture  70,000 COLONIES/ml KLEBSIELLA PNEUMONIAE  Final   Report Status 08/19/2015 FINAL  Final   Organism ID, Bacteria KLEBSIELLA PNEUMONIAE  Final      Susceptibility   Klebsiella pneumoniae - MIC*    AMPICILLIN >=32 RESISTANT Resistant     CEFAZOLIN <=4 SENSITIVE Sensitive     CEFTRIAXONE <=1 SENSITIVE Sensitive     CIPROFLOXACIN <=0.25 SENSITIVE Sensitive     GENTAMICIN <=1 SENSITIVE Sensitive     IMIPENEM <=0.25 SENSITIVE Sensitive     NITROFURANTOIN 64 INTERMEDIATE Intermediate     TRIMETH/SULFA <=20 SENSITIVE Sensitive     AMPICILLIN/SULBACTAM 8 SENSITIVE Sensitive     PIP/TAZO 8 SENSITIVE Sensitive     * 70,000 COLONIES/ml KLEBSIELLA PNEUMONIAE  Culture, blood (routine x 2)     Status: None (Preliminary result)   Collection Time: 08/17/15 12:05 PM  Result Value Ref Range Status   Specimen Description BLOOD RIGHT ARM  Final   Special Requests  BOTTLES DRAWN AEROBIC AND ANAEROBIC 5CCS  Final   Culture NO GROWTH 2 DAYS  Final   Report Status PENDING  Incomplete  Culture, blood (routine x 2)     Status: None   Collection Time: 08/17/15 12:12 PM  Result Value Ref Range Status   Specimen Description BLOOD RIGHT HAND  Final   Special Requests BOTTLES DRAWN AEROBIC ONLY 3CCS  Final   Culture  Setup Time   Final    GRAM POSITIVE COCCI IN CLUSTERS AEROBIC BOTTLE ONLY CRITICAL RESULT CALLED TO, READ BACK BY AND VERIFIED WITH: J. NARAMBAS,RN AT [id number] ON [id number] BY Rhea Bleacher    Culture   Final    STAPHYLOCOCCUS SPECIES (COAGULASE NEGATIVE) THE SIGNIFICANCE OF ISOLATING THIS ORGANISM FROM A SINGLE SET OF BLOOD CULTURES WHEN MULTIPLE SETS ARE DRAWN IS UNCERTAIN. PLEASE NOTIFY THE MICROBIOLOGY DEPARTMENT WITHIN ONE WEEK IF SPECIATION AND SENSITIVITIES ARE REQUIRED.    Report Status 08/19/2015 FINAL  Final  C difficile quick scan w PCR reflex     Status: None   Collection Time: 08/17/15  2:09 PM  Result Value Ref Range Status   C Diff antigen NEGATIVE NEGATIVE Final   C Diff toxin NEGATIVE NEGATIVE Final   C Diff interpretation Negative for toxigenic C. difficile  Final  Culture, blood (Routine X 2) w Reflex to ID Panel     Status: None (Preliminary result)   Collection Time: 08/18/15  3:37 PM  Result Value Ref Range Status   Specimen Description BLOOD RIGHT HAND  Final   Special Requests IN PEDIATRIC BOTTLE 1.5CC  Final   Culture NO GROWTH < 24 HOURS  Final   Report Status PENDING  Incomplete    Studies/Results: Ct Abdomen Pelvis Wo Contrast  08/18/2015  CLINICAL DATA:  Abdominal pain with emesis for 1 month. History small bowel obstruction. Constant diarrhea. History of perforated duodenal ulcer. Vagotomy. Peptic ulcer disease. Down syndrome. History of duodenal ulcer with luminal stenosis. EXAM: CT ABDOMEN AND PELVIS WITHOUT CONTRAST TECHNIQUE: Multidetector CT imaging of the abdomen and pelvis was performed following the standard  protocol without IV contrast. COMPARISON:  08/10/2015 FINDINGS: Lower chest: Mild motion degradation at the lung bases. Exam is degraded throughout by motion, lack of IV contrast, and support apparatus artifact, likely from telemetry device. Arms also not raised above the head. Grossly clear lung bases. Mild cardiomegaly with trace bilateral pleural fluid or thickening. Hepatobiliary: Grossly stable appearance of the liver. Gallstone. Ill definition of pericholecystic fat planes, most consistent with acute cholecystitis. Dominant stone is within  the gallbladder neck at 1.9 cm. Biliary tract not well evaluated. Pancreas: There is air within the pancreatic duct on image 22/ series 2. Edema about the pancreatic head suspected on image 25/series 2. Spleen: Normal in size, without focal abnormality. Adrenals/Urinary Tract: Grossly normal adrenal glands. Bilateral renal cortical thinning. No hydronephrosis. Normal urinary bladder. Stomach/Bowel: Surgical changes at the gastroesophageal junction. Status post Roux-en-Y gastrojejunostomy. Improvement in gastric distention with persistent luminal narrowing identified within the descending duodenum on image 28/series 2. The dilatation of the efferent loop has resolved. No residual inflammation identified. Grossly normal colon. Remainder of small bowel loops are normal in caliber. No pneumatosis or free intraperitoneal air. Vascular/Lymphatic: Normal caliber of the aorta and branch vessels. limited evaluation for retroperitoneal adenopathy. No pelvic sidewall adenopathy. Reproductive: Normal prostate. Other: No significant free fluid. Fat containing left paracentral ventral pelvic wall hernia. Musculoskeletal: Degenerative sclerosis of the left sacroiliac joint. Thoracolumbar degenerative disc disease and spondylosis which is advanced. IMPRESSION: 1. Moderate-to-marked multifactorial degradation. 2. Cholelithiasis with ill definition of pericholecystic fat planes, highly  suspicious for acute cholecystitis. 3. Status post gastrojejunostomy with resolution of dilatation and inflammation involving the efferent loop. Persistent luminal narrowing involving the descending duodenum, likely the site of prior ulcer disease. 4. Air within the pancreatic duct is nonspecific. Correlate with prior instrumentation. Cannot exclude peripancreatic edema. Consider correlation with pancreatic enzymes. These results will be called to the ordering clinician or representative by the Radiologist Assistant, and communication documented in the PACS or zVision Dashboard. Electronically Signed   By: Abigail Miyamoto M.D.   On: 08/18/2015 22:11   US Abdomen Complete  08/19/2015  CLINICAL DATA:  Cholelithiasis, leukocytosis EXAM: ULTRASOUND ABDOMEN COMPLETE COMPARISON:  CT 08/18/2015 FINDINGS: Gallbladder: 1.5 cm non mobile gallstone within the neck of the gallbladder. Gallbladder wall markedly thickened at 15 mm. Common bile duct: Diameter: Difficult to visualize, 4 mm. Liver: No focal lesion identified. Within normal limits in parenchymal echogenicity. IVC: No abnormality visualized. Pancreas: Visualized portion unremarkable. Spleen: Size and appearance within normal limits. Right Kidney: Length: 9.9 cm. Mild right hydronephrosis. Mildly increased echotexture. Left Kidney: Length: 9.9 cm. Echogenicity within normal limits. No mass or hydronephrosis visualized. Abdominal aorta: No aneurysm visualized. Other findings: None. IMPRESSION: Cholelithiasis. Markedly thickened gallbladder wall suggesting acute cholecystitis. Mild right hydronephrosis, new since prior CT. Electronically Signed   By: Rolm Baptise M.D.   On: 08/19/2015 14:34     Assessment/Plan: Leukocytosis +BCx 1/2 CNS + UCx- klebsiella Repeat BCx pending.  CT abd and u/s suggest acute chole.  Continue cipro, will add flagyl.  Stop vanco surgery f/u WBC markedly better.   Gastric Outlet obstruction anemia Down's Syndrome  Total days  of antibiotics: 1 cipro/flagyl        Available as needed.    Bobby Rumpf Infectious Diseases (pager) [phone] [email or web address] 08/19/2015, 3:03 PM  LOS: 9 days

## 2015-08-19 NOTE — Progress Notes (Signed)
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 He was asked to come to ED, and his repeat Hemoglobin was found to be 7. He received 2 units of prbc transfusion and underwent CT of the abdomen. CT showed diffuse wall thickness and dilation of the Roux limb suggestive of gastric outlet obstruction. There was no evidence of perforation or hemoperitoneum on the imaging study. Dr. Barry Dienes of surgery was consulted from the emergency department and saw the patient, recommended Gi consultation and possible EGD. GI consult from [facility] requested. Of note his hemoccult was negative. Patient underwent upper endoscopy showing large jejunal also distal to GJ anastomosis with partial gastric outlet obstruction. Patient was tried on soft diet however was unable to tolerate and placed back on full liquid diet which he's been tolerating. Patient also noted to have a significant leukocytosis. Workup consistent with a UTI and possible acute cholecystitis. General surgery and ID following.   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 Patient not tolerating soft diet too well yesterday and was changed back to full liquid diet. Patient currently tolerating full liquid diet with ensure. GI and general surgery following. Continue PPI.  #2 acute blood loss anemia/anemia of chronic disease Likely secondary to problem #1. Status post 2 units packed red blood cells. Hemoglobin currently stable at 8.0 from 9.0. Likely dilutional component. Transfusion  threshold hemoglobin less than 7. Follow H&H.   #3 leukocytosis Patient with a significant leukocytosis with a white count of 31.1 on 08/17/2015. Likely secondary to Klebsiella pneumoniae UTI and probable acute cholecystitis. Repeat CBC had a white count of 29.4. WBC currently at 12.3. Patient with soft stools loose stools.. Patient had prior C. difficile PCR checked which was negative. Repeat chest x-ray negative for any acute infiltrate. Repeat C. difficile PCR negative. Urinalysis is nitrite negative leukocytes negative, however cultures positive for klebsiella pneumonaie. Blood cultures with coagulase negative staph and likely contaminant. D/C IV Vancomycin. Continue IV Ciprofloxacin for UTI. Flagyl addedd per ID due to probable acute cholecystitis. ID [id number].   #4 probable acute cholecystitis Per CT abdomen and pelvis and abdominal ultrasound. CT abdomen and pelvis consistent with acute cholecystitis with dominant stone within gallbladder neck at 1.9 cm. Blood cultures likely a contaminant. IV vancomycin has been discontinued. Continue IV ciprofloxacin. Flagyl added per ID recommendations. General surgery following.  #5 Klebsiella pneumoniae UTI IV ciprofloxacin.   #6 bacteremia gram-positive Preliminary blood cultures obtained yesterday due to significant leukocytosis with gram-positive cocci in clusters.Likely a contaminant as coagulase negative staph growing. D/C vancomycin. ID [id number].   #7 acute on chronic kidney disease stage III Creatinine trending back down with hydration and currently at 1.67 from1.93 from 2.13 from 1.49 on 08/15/2015. Likely a prerenal azotemia as patient is noted to have borderline hypotension with some emesis and not tolerating good oral intake. Continue gentle hydration.  Follow.  #8 hypothyroidism TSH at 8.325. Free T4 within normal limits. Continue current dose of Synthroid. Repeat thyroid function studies in about 4-6 weeks with outpatient follow-up.  #9 mild  thrombocytosis Follow.  #10 prophylaxis SCDs for  DVT prophylaxis.  Code Status: Full Family Communication: Updated mother, brother at bedside. Disposition Plan: Home once leukocytosis has resolved and hemoglobin remains stable renal function back to baseline.   Consultants:  Gastroenterology: Dr. Watt Climes 08/11/2015  Gen. surgery: Dr. Hulen Skains 7 2016  Infectious disease Dr. Johnnye Sima pending  Procedures:  CT abdomen and pelvis 08/10/2015  Chest x-ray 08/17/2015  Upper endoscopy 08/11/2015  2 units packed red blood cells 08/11/2015, 08/12/2015  CT abd/pelvis 08/18/2015  Abdominal US 08/19/2015  Antibiotics:  IV vancomycin 08/18/2015  IV Ciprofloxacin 08/18/2015  HPI/Subjective: Patient sleeping. Easily arousable. No complaints. Tolerating full liquids.  Objective: Filed Vitals:   08/19/15 0826 08/19/15 1757  BP: 97/56 96/59  Pulse: 92 82  Temp: 99.4 F (37.4 C) 98.7 F (37.1 C)  Resp: 18 16    Intake/Output Summary (Last 24 hours) at 08/19/15 1935 Last data filed at 08/19/15 1541  Gross per 24 hour  Intake    702 ml  Output      1 ml  Net    701 ml   Filed Weights   08/15/15 2021 08/17/15 1923 08/18/15 2120  Weight: 51.57 kg (113 lb 11.1 oz) 50.485 kg (111 lb 4.8 oz) 52.617 kg (116 lb)    Exam:   General:  NAD  Cardiovascular: RRR  Respiratory: CTAB  Abdomen: Soft, nontender, nondistended, positive bowel sounds.  Musculoskeletal: No clubbing cyanosis or edema.  Data Reviewed: Basic Metabolic Panel:  Recent Labs Lab 08/15/15 0803 08/17/15 0523 08/18/15 0655 08/19/15 0727  NA 136 134* 140 139  K 3.9 4.0 3.9 4.0  CL 101 99* 111 111  CO2 26 25 20* 22  GLUCOSE 148* 97 93 92  BUN 10 24* 24* 20  CREATININE 1.49* 2.13* 1.93* 1.67*  CALCIUM 8.3* 8.1* 7.6* 7.7*   Liver Function Tests: No results for input(s): AST, ALT, ALKPHOS, BILITOT, PROT, ALBUMIN in the last 168 hours. No results for input(s): LIPASE, AMYLASE in the last 168  hours. No results for input(s): AMMONIA in the last 168 hours. CBC:  Recent Labs Lab 08/15/15 0803  08/17/15 0523 08/17/15 0835 08/18/15 0655 08/18/15 1537 08/19/15 0727  WBC 9.2  --  31.1* 29.4* 22.3*  --  12.3*  NEUTROABS  --   --   --  27.3* 20.0*  --   --   HGB 9.8*  < > 9.3* 9.0* 7.7* 8.4* 8.0*  HCT 31.3*  < > 28.9* 28.9* 24.5* 26.6* 25.5*  MCV 91.3  --  91.5 91.5 92.1  --  90.4  PLT 522*  --  439* 411* 391  --  388  < > = values in this interval not displayed. Cardiac Enzymes: No results for input(s): CKTOTAL, CKMB, CKMBINDEX, TROPONINI in the last 168 hours. BNP (last 3 results)  Recent Labs  08/11/15 0315  BNP 80.1    ProBNP (last 3 results) No results for input(s): PROBNP in the last 8760 hours.  CBG:  Recent Labs Lab 08/15/15 0747 08/16/15 0742 08/17/15 0732 08/18/15 0744 08/19/15 0700  GLUCAP 142* 107* 115* 100* 120*    Recent Results (from the past 240 hour(s))  C difficile quick scan w PCR reflex     Status: None   Collection Time: 08/12/15 12:38 AM  Result Value Ref Range Status   C Diff antigen NEGATIVE NEGATIVE Final   C Diff toxin NEGATIVE NEGATIVE Final   C Diff interpretation Negative for toxigenic C. difficile  Final  Culture, Urine     Status: None  Collection Time: 08/17/15 11:50 AM  Result Value Ref Range Status   Specimen Description URINE, CLEAN CATCH  Final   Special Requests NONE  Final   Culture 70,000 COLONIES/ml KLEBSIELLA PNEUMONIAE  Final   Report Status 08/19/2015 FINAL  Final   Organism ID, Bacteria KLEBSIELLA PNEUMONIAE  Final      Susceptibility   Klebsiella pneumoniae - MIC*    AMPICILLIN >=32 RESISTANT Resistant     CEFAZOLIN <=4 SENSITIVE Sensitive     CEFTRIAXONE <=1 SENSITIVE Sensitive     CIPROFLOXACIN <=0.25 SENSITIVE Sensitive     GENTAMICIN <=1 SENSITIVE Sensitive     IMIPENEM <=0.25 SENSITIVE Sensitive     NITROFURANTOIN 64 INTERMEDIATE Intermediate     TRIMETH/SULFA <=20 SENSITIVE Sensitive      AMPICILLIN/SULBACTAM 8 SENSITIVE Sensitive     PIP/TAZO 8 SENSITIVE Sensitive     * 70,000 COLONIES/ml KLEBSIELLA PNEUMONIAE  Culture, blood (routine x 2)     Status: None (Preliminary result)   Collection Time: 08/17/15 12:05 PM  Result Value Ref Range Status   Specimen Description BLOOD RIGHT ARM  Final   Special Requests BOTTLES DRAWN AEROBIC AND ANAEROBIC 5CCS  Final   Culture NO GROWTH 2 DAYS  Final   Report Status PENDING  Incomplete  Culture, blood (routine x 2)     Status: None   Collection Time: 08/17/15 12:12 PM  Result Value Ref Range Status   Specimen Description BLOOD RIGHT HAND  Final   Special Requests BOTTLES DRAWN AEROBIC ONLY 3CCS  Final   Culture  Setup Time   Final    GRAM POSITIVE COCCI IN CLUSTERS AEROBIC BOTTLE ONLY CRITICAL RESULT CALLED TO, READ BACK BY AND VERIFIED WITH: J. NARAMBAS,RN AT [id number] ON [id number] BY Rhea Bleacher    Culture   Final    STAPHYLOCOCCUS SPECIES (COAGULASE NEGATIVE) THE SIGNIFICANCE OF ISOLATING THIS ORGANISM FROM A SINGLE SET OF BLOOD CULTURES WHEN MULTIPLE SETS ARE DRAWN IS UNCERTAIN. PLEASE NOTIFY THE MICROBIOLOGY DEPARTMENT WITHIN ONE WEEK IF SPECIATION AND SENSITIVITIES ARE REQUIRED.    Report Status 08/19/2015 FINAL  Final  C difficile quick scan w PCR reflex     Status: None   Collection Time: 08/17/15  2:09 PM  Result Value Ref Range Status   C Diff antigen NEGATIVE NEGATIVE Final   C Diff toxin NEGATIVE NEGATIVE Final   C Diff interpretation Negative for toxigenic C. difficile  Final  Culture, blood (Routine X 2) w Reflex to ID Panel     Status: None (Preliminary result)   Collection Time: 08/18/15  3:37 PM  Result Value Ref Range Status   Specimen Description BLOOD RIGHT HAND  Final   Special Requests IN PEDIATRIC BOTTLE 1.5CC  Final   Culture NO GROWTH < 24 HOURS  Final   Report Status PENDING  Incomplete     Studies: Ct Abdomen Pelvis Wo Contrast  08/18/2015  CLINICAL DATA:  Abdominal pain with emesis for 1 month.  History small bowel obstruction. Constant diarrhea. History of perforated duodenal ulcer. Vagotomy. Peptic ulcer disease. Down syndrome. History of duodenal ulcer with luminal stenosis. EXAM: CT ABDOMEN AND PELVIS WITHOUT CONTRAST TECHNIQUE: Multidetector CT imaging of the abdomen and pelvis was performed following the standard protocol without IV contrast. COMPARISON:  08/10/2015 FINDINGS: Lower chest: Mild motion degradation at the lung bases. Exam is degraded throughout by motion, lack of IV contrast, and support apparatus artifact, likely from telemetry device. Arms also not raised above the head. Grossly clear lung bases. Mild  cardiomegaly with trace bilateral pleural fluid or thickening. Hepatobiliary: Grossly stable appearance of the liver. Gallstone. Ill definition of pericholecystic fat planes, most consistent with acute cholecystitis. Dominant stone is within the gallbladder neck at 1.9 cm. Biliary tract not well evaluated. Pancreas: There is air within the pancreatic duct on image 22/ series 2. Edema about the pancreatic head suspected on image 25/series 2. Spleen: Normal in size, without focal abnormality. Adrenals/Urinary Tract: Grossly normal adrenal glands. Bilateral renal cortical thinning. No hydronephrosis. Normal urinary bladder. Stomach/Bowel: Surgical changes at the gastroesophageal junction. Status post Roux-en-Y gastrojejunostomy. Improvement in gastric distention with persistent luminal narrowing identified within the descending duodenum on image 28/series 2. The dilatation of the efferent loop has resolved. No residual inflammation identified. Grossly normal colon. Remainder of small bowel loops are normal in caliber. No pneumatosis or free intraperitoneal air. Vascular/Lymphatic: Normal caliber of the aorta and branch vessels. limited evaluation for retroperitoneal adenopathy. No pelvic sidewall adenopathy. Reproductive: Normal prostate. Other: No significant free fluid. Fat containing left  paracentral ventral pelvic wall hernia. Musculoskeletal: Degenerative sclerosis of the left sacroiliac joint. Thoracolumbar degenerative disc disease and spondylosis which is advanced. IMPRESSION: 1. Moderate-to-marked multifactorial degradation. 2. Cholelithiasis with ill definition of pericholecystic fat planes, highly suspicious for acute cholecystitis. 3. Status post gastrojejunostomy with resolution of dilatation and inflammation involving the efferent loop. Persistent luminal narrowing involving the descending duodenum, likely the site of prior ulcer disease. 4. Air within the pancreatic duct is nonspecific. Correlate with prior instrumentation. Cannot exclude peripancreatic edema. Consider correlation with pancreatic enzymes. These results will be called to the ordering clinician or representative by the Radiologist Assistant, and communication documented in the PACS or zVision Dashboard. Electronically Signed   By: Abigail Miyamoto M.D.   On: 08/18/2015 22:11   US Abdomen Complete  08/19/2015  CLINICAL DATA:  Cholelithiasis, leukocytosis EXAM: ULTRASOUND ABDOMEN COMPLETE COMPARISON:  CT 08/18/2015 FINDINGS: Gallbladder: 1.5 cm non mobile gallstone within the neck of the gallbladder. Gallbladder wall markedly thickened at 15 mm. Common bile duct: Diameter: Difficult to visualize, 4 mm. Liver: No focal lesion identified. Within normal limits in parenchymal echogenicity. IVC: No abnormality visualized. Pancreas: Visualized portion unremarkable. Spleen: Size and appearance within normal limits. Right Kidney: Length: 9.9 cm. Mild right hydronephrosis. Mildly increased echotexture. Left Kidney: Length: 9.9 cm. Echogenicity within normal limits. No mass or hydronephrosis visualized. Abdominal aorta: No aneurysm visualized. Other findings: None. IMPRESSION: Cholelithiasis. Markedly thickened gallbladder wall suggesting acute cholecystitis. Mild right hydronephrosis, new since prior CT. Electronically Signed   By:  Rolm Baptise M.D.   On: 08/19/2015 14:34    Scheduled Meds: . antiseptic oral rinse  7 mL Mouth Rinse q12n4p  . chlorhexidine  15 mL Mouth Rinse BID  . ciprofloxacin  400 mg Intravenous Q24H  . feeding supplement (ENSURE ENLIVE)  237 mL Oral BID BM  . levothyroxine  150 mcg Oral QAC breakfast  . loperamide  2 mg Oral BID  . pantoprazole sodium  40 mg Oral BID  . sodium chloride  3 mL Intravenous Q12H  . sucralfate  1 g Oral QID  . vancomycin  500 mg Intravenous Q24H   Continuous Infusions: . sodium chloride 75 mL/hr (08/18/15 1649)    Principal Problem:   Blood loss anemia Active Problems:   Duodenal ulcer   Bacteremia   Gastric out let obstruction   Hypothyroidism   Acute kidney injury superimposed on chronic kidney disease (HCC)   PUD (peptic ulcer disease)   Leukocytosis   Gastric  outlet obstruction   Calculus of gallbladder with acute cholecystitis   UTI (urinary tract infection)    Time spent: 41 minutes    THOMPSON,DANIEL M.D. [facility] Pager [phone]. If 7PM-7AM, please contact night-coverage at [email or web address], password [facility] 08/19/2015, 7:35 PM  LOS: 9 days

## 2015-08-19 NOTE — Progress Notes (Signed)
8 Days Post-Op  Subjective: His brother is with him and he seems to be fine.  He appears to be tolerating his full liquids.  He denies any tenderness on palpation of his abdomen.    Objective: Vital signs in last 24 hours: Temp:  [98.6 F (37 C)-99.4 F (37.4 C)] 99.4 F (37.4 C) (12/16 0826) Pulse Rate:  [92-98] 92 (12/16 0826) Resp:  [14-18] 18 (12/16 0826) BP: (86-97)/(47-60) 97/56 mmHg (12/16 0826) SpO2:  [97 %-98 %] 97 % (12/16 0826) Weight:  [52.617 kg (116 lb)] 52.617 kg (116 lb) (12/15 2120) Last BM Date: 08/18/15 942 ml recorded yesterday Stool x 1 Urine not recorded. Afebrile, VSS, BP stays on the low side WBC improving, H/H stable CT scan yesterday: Moderate-to-marked multifactorial degradation. 2. Cholelithiasis with ill definition of pericholecystic fat planes, highly suspicious for acute cholecystitis. 3. Status post gastrojejunostomy with resolution of dilatation and inflammation involving the efferent loop. Persistent luminal narrowing involving the descending duodenum, likely the site of prior ulcer disease. 4. Air within the pancreatic duct is nonspecific. Correlate with prior instrumentation. Cannot exclude peripancreatic edema. Consider correlation with pancreatic enzymes. Intake/Output from previous day: 12/15 0701 - 12/16 0700 In: 942 [P.O.:942] Out: 1 [Stool:1] Intake/Output this shift: Total I/O In: 60 [P.O.:60] Out: 0   General appearance: alert, cooperative and no distress GI: soft, a little distended, + BS, no pain on palpaiton.    Lab Results:   Recent Labs  08/17/15 0835 08/18/15 0655 08/18/15 1537  WBC 29.4* 22.3*  --   HGB 9.0* 7.7* 8.4*  HCT 28.9* 24.5* 26.6*  PLT 411* 391  --     BMET  Recent Labs  08/17/15 0523 08/18/15 0655  NA 134* 140  K 4.0 3.9  CL 99* 111  CO2 25 20*  GLUCOSE 97 93  BUN 24* 24*  CREATININE 2.13* 1.93*  CALCIUM 8.1* 7.6*   PT/INR No results for input(s): LABPROT, INR in the last 72  hours.  No results for input(s): AST, ALT, ALKPHOS, BILITOT, PROT, ALBUMIN in the last 168 hours.   Lipase     Component Value Date/Time   LIPASE 31 08/10/2015 1359     Studies/Results: Ct Abdomen Pelvis Wo Contrast  08/18/2015  CLINICAL DATA:  Abdominal pain with emesis for 1 month. History small bowel obstruction. Constant diarrhea. History of perforated duodenal ulcer. Vagotomy. Peptic ulcer disease. Down syndrome. History of duodenal ulcer with luminal stenosis. EXAM: CT ABDOMEN AND PELVIS WITHOUT CONTRAST TECHNIQUE: Multidetector CT imaging of the abdomen and pelvis was performed following the standard protocol without IV contrast. COMPARISON:  08/10/2015 FINDINGS: Lower chest: Mild motion degradation at the lung bases. Exam is degraded throughout by motion, lack of IV contrast, and support apparatus artifact, likely from telemetry device. Arms also not raised above the head. Grossly clear lung bases. Mild cardiomegaly with trace bilateral pleural fluid or thickening. Hepatobiliary: Grossly stable appearance of the liver. Gallstone. Ill definition of pericholecystic fat planes, most consistent with acute cholecystitis. Dominant stone is within the gallbladder neck at 1.9 cm. Biliary tract not well evaluated. Pancreas: There is air within the pancreatic duct on image 22/ series 2. Edema about the pancreatic head suspected on image 25/series 2. Spleen: Normal in size, without focal abnormality. Adrenals/Urinary Tract: Grossly normal adrenal glands. Bilateral renal cortical thinning. No hydronephrosis. Normal urinary bladder. Stomach/Bowel: Surgical changes at the gastroesophageal junction. Status post Roux-en-Y gastrojejunostomy. Improvement in gastric distention with persistent luminal narrowing identified within the descending duodenum on image 28/series  2. The dilatation of the efferent loop has resolved. No residual inflammation identified. Grossly normal colon. Remainder of small bowel loops are  normal in caliber. No pneumatosis or free intraperitoneal air. Vascular/Lymphatic: Normal caliber of the aorta and branch vessels. limited evaluation for retroperitoneal adenopathy. No pelvic sidewall adenopathy. Reproductive: Normal prostate. Other: No significant free fluid. Fat containing left paracentral ventral pelvic wall hernia. Musculoskeletal: Degenerative sclerosis of the left sacroiliac joint. Thoracolumbar degenerative disc disease and spondylosis which is advanced. IMPRESSION: 1. Moderate-to-marked multifactorial degradation. 2. Cholelithiasis with ill definition of pericholecystic fat planes, highly suspicious for acute cholecystitis. 3. Status post gastrojejunostomy with resolution of dilatation and inflammation involving the efferent loop. Persistent luminal narrowing involving the descending duodenum, likely the site of prior ulcer disease. 4. Air within the pancreatic duct is nonspecific. Correlate with prior instrumentation. Cannot exclude peripancreatic edema. Consider correlation with pancreatic enzymes. These results will be called to the ordering clinician or representative by the Radiologist Assistant, and communication documented in the PACS or zVision Dashboard. Electronically Signed   By: Abigail Miyamoto M.D.   On: 08/18/2015 22:11   Dg Chest Port 1 View  08/17/2015  CLINICAL DATA:  Fever today.  Initial encounter. EXAM: PORTABLE CHEST 1 VIEW COMPARISON:  PA and lateral chest 01/18/2006. FINDINGS: The lungs are clear. Heart size is normal. No pneumothorax or pleural effusion. Marked degenerative change about the shoulders is noted. IMPRESSION: No acute disease. Electronically Signed   By: Inge Rise M.D.   On: 08/17/2015 12:29    Medications: . antiseptic oral rinse  7 mL Mouth Rinse q12n4p  . chlorhexidine  15 mL Mouth Rinse BID  . ciprofloxacin  400 mg Intravenous Q24H  . feeding supplement (ENSURE ENLIVE)  237 mL Oral BID BM  . levothyroxine  150 mcg Oral QAC breakfast   . loperamide  2 mg Oral BID  . pantoprazole sodium  40 mg Oral BID  . sodium chloride  3 mL Intravenous Q12H  . sucralfate  1 g Oral QID  . vancomycin  500 mg Intravenous Q24H    Assessment/Plan Partial gastric obstruction with large jejunal ulcer distal to GJ anastomosis (on liquids & PPI) S/p Roux-en-y with vagotomy, Stamm gastrostomy 2007 Trisomy 21 Hx of bleeding peptic ulcers Severe anemia Positive blood culture Antibiotics:  Cipro/Vancomycin day 2 DVT: SCD   Plan:  Continue medical management, we will get an ultrasound of abdomen to look at [place] today.  CBC, CMP tomorrow.     LOS: 9 days    Isaac Ramirez 08/19/2015

## 2015-08-19 NOTE — Care Management Note (Addendum)
Case Management Note  Patient Details  Name: Isaac Ramirez MRN: [id number] Date of Birth: 06/11/1951  Subjective/Objective:        CM following for progression and d/c planning.            Action/Plan: 08/19/2015 Met with pt mother re [facility] services, she is interested in this for her son, this pt , however she is unable to read the list of providers from which to choose. She wishes to have her other adult children review the list and will make a decision at that time. CM will continue to follow.  Noted that this pt is followed by [facility].   Expected Discharge Date:                  Expected Discharge Plan:  [place]  In-House Referral:  NA  Discharge planning Services  CM Consult  Post Acute Care Choice:  NA Choice offered to:  Parent  DME Arranged:    DME Agency:     HH Arranged:  PT, OT HH Agency:     Status of Service:  In process, will continue to follow  Medicare Important Message Given:  Yes Date Medicare IM Given:    Medicare IM give by:    Date Additional Medicare IM Given:    Additional Medicare Important Message give by:     If discussed at [place] of Stay Meetings, dates discussed:    Additional Comments:  Adron Bene, RN 08/19/2015, 1:50 PM

## 2015-08-19 NOTE — Progress Notes (Signed)
Patient walked till nurse's station and back again with the help of walker without any problem.

## 2015-08-20 LAB — GLUCOSE, CAPILLARY
GLUCOSE-CAPILLARY: 89 mg/dL (ref 65–99)
GLUCOSE-CAPILLARY: 97 mg/dL (ref 65–99)

## 2015-08-20 LAB — COMPREHENSIVE METABOLIC PANEL
ALT: 17 U/L (ref 17–63)
ANION GAP: 7 (ref 5–15)
AST: 25 U/L (ref 15–41)
Albumin: 1.4 g/dL — ABNORMAL LOW (ref 3.5–5.0)
Alkaline Phosphatase: 64 U/L (ref 38–126)
BILIRUBIN TOTAL: 0.3 mg/dL (ref 0.3–1.2)
BUN: 14 mg/dL (ref 6–20)
CO2: 21 mmol/L — ABNORMAL LOW (ref 22–32)
Calcium: 7.5 mg/dL — ABNORMAL LOW (ref 8.9–10.3)
Chloride: 110 mmol/L (ref 101–111)
Creatinine, Ser: 1.52 mg/dL — ABNORMAL HIGH (ref 0.61–1.24)
GFR, EST AFRICAN AMERICAN: 55 mL/min — AB (ref >60)
GFR, EST NON AFRICAN AMERICAN: 47 mL/min — AB (ref >60)
Glucose, Bld: 105 mg/dL — ABNORMAL HIGH (ref 65–99)
POTASSIUM: 3.9 mmol/L (ref 3.5–5.1)
Sodium: 138 mmol/L (ref 135–145)
TOTAL PROTEIN: 4.9 g/dL — AB (ref 6.5–8.1)

## 2015-08-20 LAB — CBC
HEMATOCRIT: 25.5 % — AB (ref 39.0–52.0)
Hemoglobin: 8.1 g/dL — ABNORMAL LOW (ref 13.0–17.0)
MCH: 28.8 pg (ref 26.0–34.0)
MCHC: 31.8 g/dL (ref 30.0–36.0)
MCV: 90.7 fL (ref 78.0–100.0)
PLATELETS: 392 10*3/uL (ref 150–400)
RBC: 2.81 MIL/uL — AB (ref 4.22–5.81)
RDW: 16.8 % — AB (ref 11.5–15.5)
WBC: 10.1 10*3/uL (ref 4.0–10.5)

## 2015-08-20 MED ORDER — METRONIDAZOLE IN NACL 5-0.79 MG/ML-% IV SOLN
500.0000 mg | Freq: Three times a day (TID) | INTRAVENOUS | Status: DC
  Administered 2015-08-20 – 2015-08-22 (×6): 500 mg via INTRAVENOUS
  Filled 2015-08-20 (×6): qty 100

## 2015-08-20 NOTE — Progress Notes (Signed)
Orthopedic Tech Progress Note Patient Details:  Isaac Ramirez 06-16-1951 [id number]  Ortho Devices Type of Ortho Device: Knee Immobilizer Ortho Device/Splint Location: replacement knee immoboilizer Ortho Device/Splint Interventions: Application   Isaac Ramirez 08/20/2015, 11:21 AM

## 2015-08-20 NOTE — Progress Notes (Signed)
Came to assess pt, pt was already on the NIV at this time, no complications noted. Pt tolerating it well

## 2015-08-20 NOTE — Progress Notes (Signed)
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 He was asked to come to ED, and his repeat Hemoglobin was found to be 7. He received 2 units of prbc transfusion and underwent CT of the abdomen. CT showed diffuse wall thickness and dilation of the Roux limb suggestive of gastric outlet obstruction. There was no evidence of perforation or hemoperitoneum on the imaging study. Dr. Barry Dienes of surgery was consulted from the emergency department and saw the patient, recommended Gi consultation and possible EGD. GI consult from [facility] requested. Of note his hemoccult was negative. Patient underwent upper endoscopy showing large jejunal also distal to GJ anastomosis with partial gastric outlet obstruction. Patient was tried on soft diet however was unable to tolerate and placed back on full liquid diet which he's been tolerating. Patient also noted to have a significant leukocytosis. Workup consistent with a UTI and possible acute cholecystitis. General surgery and ID following.   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 Patient currently tolerating full liquid diet with ensure. GI and general surgery following. Continue PPI.  #2 acute blood loss anemia/anemia of chronic disease Likely secondary to problem #1. Status post 2 units packed red blood cells. Hemoglobin currently stable at 8.1 from 9.0. Likely dilutional component. Transfusion threshold hemoglobin less than 7. Follow H&H.   #3 leukocytosis Patient with a significant  leukocytosis with a white count of 31.1 on 08/17/2015. Likely secondary to Klebsiella pneumoniae UTI and acute cholecystitis. Repeat CBC had a white count of 29.4. WBC currently at 12.3. Patient with soft stools loose stools.. Patient had prior C. difficile PCR checked which was negative. Repeat chest x-ray negative for any acute infiltrate. Repeat C. difficile PCR negative. Urinalysis is nitrite negative, leukocytes negative, however cultures positive for klebsiella pneumonaie. Blood cultures with coagulase negative staph and likely contaminant. D/C IV Vancomycin. Continue IV Ciprofloxacin for UTI. Start IV Flagyl per ID due to acute cholecystitis. ID ff.   #4 probable acute cholecystitis Per CT abdomen and pelvis and abdominal ultrasound. CT abdomen and pelvis consistent with acute cholecystitis with dominant stone within gallbladder neck at 1.9 cm. Blood cultures likely a contaminant. ID recommended discontinuing IV vancomycin and starting patient on Flagyl. Continue IV ciprofloxacin. Patient likely needs a cholecystectomy which will be decided per general surgery. General surgery following.  #5 Klebsiella pneumoniae UTI IV ciprofloxacin.   #6 bacteremia gram-positive Preliminary blood cultures obtained yesterday due to significant leukocytosis with gram-positive cocci in clusters.Likely a contaminant as coagulase negative staph growing. D/C vancomycin. ID FF.   #7 acute on chronic kidney disease stage III Creatinine trending back down with hydration and currently at 1.52 from 1.67 from1.93 from 2.13 from 1.49 on 08/15/2015. Likely a prerenal azotemia as patient is noted to have borderline hypotension with some emesis and not tolerating good oral intake. Continue gentle hydration.  Follow.  #8 hypothyroidism TSH at 8.325. Free T4 within normal limits. Continue current dose of Synthroid. Repeat thyroid function studies in about 4-6 weeks with outpatient follow-up.  #9 mild  thrombocytosis Follow.  #10 prophylaxis SCDs for DVT prophylaxis.  Code Status: Full Family Communication: Updated mother at bedside. Disposition Plan: Home once acute cholecystitis managed per [facility]..   Consultants:  Gastroenterology: Dr. Watt Climes 08/11/2015  Gen. surgery: Dr. Hulen Skains 7 2016  Infectious disease Dr. Johnnye Sima 08/18/2015  Procedures:  CT abdomen and pelvis 08/10/2015  Chest x-ray 08/17/2015  Upper endoscopy 08/11/2015  2 units packed red blood cells 08/11/2015, 08/12/2015  CT abd/pelvis 08/18/2015  Abdominal US 08/19/2015  Antibiotics:  IV vancomycin 08/18/2015>>>>08/20/2015  IV Ciprofloxacin 08/18/2015  IV Flagyl 08/20/2015  HPI/Subjective: Patient in bed in NAD. No CP. No SOB. Tolerating full liquids.  Objective: Filed Vitals:   08/20/15 0618 08/20/15 0727  BP: 103/60 108/60  Pulse: 81 80  Temp: 97.7 F (36.5 C) 99 F (37.2 C)  Resp: 18 17    Intake/Output Summary (Last 24 hours) at 08/20/15 1238 Last data filed at 08/20/15 1030  Gross per 24 hour  Intake   1790 ml  Output      0 ml  Net   1790 ml   Filed Weights   08/17/15 1923 08/18/15 2120 08/19/15 2114  Weight: 50.485 kg (111 lb 4.8 oz) 52.617 kg (116 lb) 52.708 kg (116 lb 3.2 oz)    Exam:   General:  NAD  Cardiovascular: RRR  Respiratory: CTAB  Abdomen: Soft, tender to palpation in RUQ, nondistended, positive bowel sounds.  Musculoskeletal: No clubbing cyanosis or edema.  Data Reviewed: Basic Metabolic Panel:  Recent Labs Lab 08/15/15 0803 08/17/15 0523 08/18/15 0655 08/19/15 0727 08/20/15 0317  NA 136 134* 140 139 138  K 3.9 4.0 3.9 4.0 3.9  CL 101 99* 111 111 110  CO2 26 25 20* 22 21*  GLUCOSE 148* 97 93 92 105*  BUN 10 24* 24* 20 14  CREATININE 1.49* 2.13* 1.93* 1.67* 1.52*  CALCIUM 8.3* 8.1* 7.6* 7.7* 7.5*   Liver Function Tests:  Recent Labs Lab 08/20/15 0317  AST 25  ALT 17  ALKPHOS 64  BILITOT 0.3  PROT 4.9*  ALBUMIN 1.4*   No results  for input(s): LIPASE, AMYLASE in the last 168 hours. No results for input(s): AMMONIA in the last 168 hours. CBC:  Recent Labs Lab 08/17/15 0523 08/17/15 0835 08/18/15 0655 08/18/15 1537 08/19/15 0727 08/20/15 0317  WBC 31.1* 29.4* 22.3*  --  12.3* 10.1  NEUTROABS  --  27.3* 20.0*  --   --   --   HGB 9.3* 9.0* 7.7* 8.4* 8.0* 8.1*  HCT 28.9* 28.9* 24.5* 26.6* 25.5* 25.5*  MCV 91.5 91.5 92.1  --  90.4 90.7  PLT 439* 411* 391  --  388 392   Cardiac Enzymes: No results for input(s): CKTOTAL, CKMB, CKMBINDEX, TROPONINI in the last 168 hours. BNP (last 3 results)  Recent Labs  08/11/15 0315  BNP 80.1    ProBNP (last 3 results) No results for input(s): PROBNP in the last 8760 hours.  CBG:  Recent Labs Lab 08/17/15 0732 08/18/15 0744 08/19/15 0700 08/20/15 0022 08/20/15 0723  GLUCAP 115* 100* 120* 89 97    Recent Results (from the past 240 hour(s))  C difficile quick scan w PCR reflex     Status: None   Collection Time: 08/12/15 12:38 AM  Result Value Ref Range Status   C Diff antigen NEGATIVE NEGATIVE Final   C Diff toxin NEGATIVE NEGATIVE Final   C Diff interpretation Negative for toxigenic C. difficile  Final  Culture, Urine     Status: None   Collection Time: 08/17/15 11:50 AM  Result  Value Ref Range Status   Specimen Description URINE, CLEAN CATCH  Final   Special Requests NONE  Final   Culture 70,000 COLONIES/ml KLEBSIELLA PNEUMONIAE  Final   Report Status 08/19/2015 FINAL  Final   Organism ID, Bacteria KLEBSIELLA PNEUMONIAE  Final      Susceptibility   Klebsiella pneumoniae - MIC*    AMPICILLIN >=32 RESISTANT Resistant     CEFAZOLIN <=4 SENSITIVE Sensitive     CEFTRIAXONE <=1 SENSITIVE Sensitive     CIPROFLOXACIN <=0.25 SENSITIVE Sensitive     GENTAMICIN <=1 SENSITIVE Sensitive     IMIPENEM <=0.25 SENSITIVE Sensitive     NITROFURANTOIN 64 INTERMEDIATE Intermediate     TRIMETH/SULFA <=20 SENSITIVE Sensitive     AMPICILLIN/SULBACTAM 8 SENSITIVE  Sensitive     PIP/TAZO 8 SENSITIVE Sensitive     * 70,000 COLONIES/ml KLEBSIELLA PNEUMONIAE  Culture, blood (routine x 2)     Status: None (Preliminary result)   Collection Time: 08/17/15 12:05 PM  Result Value Ref Range Status   Specimen Description BLOOD RIGHT ARM  Final   Special Requests BOTTLES DRAWN AEROBIC AND ANAEROBIC 5CCS  Final   Culture NO GROWTH 3 DAYS  Final   Report Status PENDING  Incomplete  Culture, blood (routine x 2)     Status: None   Collection Time: 08/17/15 12:12 PM  Result Value Ref Range Status   Specimen Description BLOOD RIGHT HAND  Final   Special Requests BOTTLES DRAWN AEROBIC ONLY 3CCS  Final   Culture  Setup Time   Final    GRAM POSITIVE COCCI IN CLUSTERS AEROBIC BOTTLE ONLY CRITICAL RESULT CALLED TO, READ BACK BY AND VERIFIED WITH: J. NARAMBAS,RN AT [id number] ON [id number] BY Rhea Bleacher    Culture   Final    STAPHYLOCOCCUS SPECIES (COAGULASE NEGATIVE) THE SIGNIFICANCE OF ISOLATING THIS ORGANISM FROM A SINGLE SET OF BLOOD CULTURES WHEN MULTIPLE SETS ARE DRAWN IS UNCERTAIN. PLEASE NOTIFY THE MICROBIOLOGY DEPARTMENT WITHIN ONE WEEK IF SPECIATION AND SENSITIVITIES ARE REQUIRED.    Report Status 08/19/2015 FINAL  Final  C difficile quick scan w PCR reflex     Status: None   Collection Time: 08/17/15  2:09 PM  Result Value Ref Range Status   C Diff antigen NEGATIVE NEGATIVE Final   C Diff toxin NEGATIVE NEGATIVE Final   C Diff interpretation Negative for toxigenic C. difficile  Final  Culture, blood (Routine X 2) w Reflex to ID Panel     Status: None (Preliminary result)   Collection Time: 08/18/15  3:37 PM  Result Value Ref Range Status   Specimen Description BLOOD RIGHT HAND  Final   Special Requests IN PEDIATRIC BOTTLE 1.5CC  Final   Culture NO GROWTH 2 DAYS  Final   Report Status PENDING  Incomplete     Studies: Ct Abdomen Pelvis Wo Contrast  08/18/2015  CLINICAL DATA:  Abdominal pain with emesis for 1 month. History small bowel obstruction. Constant  diarrhea. History of perforated duodenal ulcer. Vagotomy. Peptic ulcer disease. Down syndrome. History of duodenal ulcer with luminal stenosis. EXAM: CT ABDOMEN AND PELVIS WITHOUT CONTRAST TECHNIQUE: Multidetector CT imaging of the abdomen and pelvis was performed following the standard protocol without IV contrast. COMPARISON:  08/10/2015 FINDINGS: Lower chest: Mild motion degradation at the lung bases. Exam is degraded throughout by motion, lack of IV contrast, and support apparatus artifact, likely from telemetry device. Arms also not raised above the head. Grossly clear lung bases. Mild cardiomegaly with trace bilateral pleural fluid or thickening.  Hepatobiliary: Grossly stable appearance of the liver. Gallstone. Ill definition of pericholecystic fat planes, most consistent with acute cholecystitis. Dominant stone is within the gallbladder neck at 1.9 cm. Biliary tract not well evaluated. Pancreas: There is air within the pancreatic duct on image 22/ series 2. Edema about the pancreatic head suspected on image 25/series 2. Spleen: Normal in size, without focal abnormality. Adrenals/Urinary Tract: Grossly normal adrenal glands. Bilateral renal cortical thinning. No hydronephrosis. Normal urinary bladder. Stomach/Bowel: Surgical changes at the gastroesophageal junction. Status post Roux-en-Y gastrojejunostomy. Improvement in gastric distention with persistent luminal narrowing identified within the descending duodenum on image 28/series 2. The dilatation of the efferent loop has resolved. No residual inflammation identified. Grossly normal colon. Remainder of small bowel loops are normal in caliber. No pneumatosis or free intraperitoneal air. Vascular/Lymphatic: Normal caliber of the aorta and branch vessels. limited evaluation for retroperitoneal adenopathy. No pelvic sidewall adenopathy. Reproductive: Normal prostate. Other: No significant free fluid. Fat containing left paracentral ventral pelvic wall hernia.  Musculoskeletal: Degenerative sclerosis of the left sacroiliac joint. Thoracolumbar degenerative disc disease and spondylosis which is advanced. IMPRESSION: 1. Moderate-to-marked multifactorial degradation. 2. Cholelithiasis with ill definition of pericholecystic fat planes, highly suspicious for acute cholecystitis. 3. Status post gastrojejunostomy with resolution of dilatation and inflammation involving the efferent loop. Persistent luminal narrowing involving the descending duodenum, likely the site of prior ulcer disease. 4. Air within the pancreatic duct is nonspecific. Correlate with prior instrumentation. Cannot exclude peripancreatic edema. Consider correlation with pancreatic enzymes. These results will be called to the ordering clinician or representative by the Radiologist Assistant, and communication documented in the PACS or zVision Dashboard. Electronically Signed   By: Abigail Miyamoto M.D.   On: 08/18/2015 22:11   US Abdomen Complete  08/19/2015  CLINICAL DATA:  Cholelithiasis, leukocytosis EXAM: ULTRASOUND ABDOMEN COMPLETE COMPARISON:  CT 08/18/2015 FINDINGS: Gallbladder: 1.5 cm non mobile gallstone within the neck of the gallbladder. Gallbladder wall markedly thickened at 15 mm. Common bile duct: Diameter: Difficult to visualize, 4 mm. Liver: No focal lesion identified. Within normal limits in parenchymal echogenicity. IVC: No abnormality visualized. Pancreas: Visualized portion unremarkable. Spleen: Size and appearance within normal limits. Right Kidney: Length: 9.9 cm. Mild right hydronephrosis. Mildly increased echotexture. Left Kidney: Length: 9.9 cm. Echogenicity within normal limits. No mass or hydronephrosis visualized. Abdominal aorta: No aneurysm visualized. Other findings: None. IMPRESSION: Cholelithiasis. Markedly thickened gallbladder wall suggesting acute cholecystitis. Mild right hydronephrosis, new since prior CT. Electronically Signed   By: Rolm Baptise M.D.   On: 08/19/2015 14:34     Scheduled Meds: . antiseptic oral rinse  7 mL Mouth Rinse q12n4p  . chlorhexidine  15 mL Mouth Rinse BID  . ciprofloxacin  400 mg Intravenous Q24H  . feeding supplement (ENSURE ENLIVE)  237 mL Oral BID BM  . levothyroxine  150 mcg Oral QAC breakfast  . loperamide  2 mg Oral BID  . pantoprazole sodium  40 mg Oral BID  . sodium chloride  3 mL Intravenous Q12H  . sucralfate  1 g Oral QID  . vancomycin  500 mg Intravenous Q24H   Continuous Infusions: . sodium chloride 50 mL/hr at 08/19/15 2238    Principal Problem:   Blood loss anemia Active Problems:   Duodenal ulcer   Bacteremia   Gastric out let obstruction   Hypothyroidism   Acute kidney injury superimposed on chronic kidney disease (HCC)   PUD (peptic ulcer disease)   Leukocytosis   Gastric outlet obstruction   Calculus of gallbladder  with acute cholecystitis   UTI (urinary tract infection)    Time spent: 38 minutes    THOMPSON,DANIEL M.D. [facility] Pager [phone]. If 7PM-7AM, please contact night-coverage at [email or web address], password [facility] [place] [facility] 08/20/2015, 12:38 PM  LOS: 10 days

## 2015-08-20 NOTE — Progress Notes (Signed)
9 Days Post-Op  Subjective: Tolerating po without emesis Denies abdominal pain  Objective: Vital signs in last 24 hours: Temp:  [97.6 F (36.4 C)-99 F (37.2 C)] 99 F (37.2 C) (12/17 0727) Pulse Rate:  [79-82] 80 (12/17 0727) Resp:  [16-18] 17 (12/17 0727) BP: (93-108)/(57-60) 108/60 mmHg (12/17 0727) SpO2:  [94 %-100 %] 100 % (12/17 0727) Weight:  [52.708 kg (116 lb 3.2 oz)] 52.708 kg (116 lb 3.2 oz) (12/16 2114) Last BM Date: 08/19/15  Intake/Output from previous day: 12/16 0701 - 12/17 0700 In: 1670 [P.O.:720; I.V.:750; IV Piggyback:200] Out: 0  Intake/Output this shift: Total I/O In: 360 [P.O.:360] Out: 0   Abdomen soft with mild RUQ tenderness  Lab Results:   Recent Labs  08/19/15 0727 08/20/15 0317  WBC 12.3* 10.1  HGB 8.0* 8.1*  HCT 25.5* 25.5*  PLT 388 392   BMET  Recent Labs  08/19/15 0727 08/20/15 0317  NA 139 138  K 4.0 3.9  CL 111 110  CO2 22 21*  GLUCOSE 92 105*  BUN 20 14  CREATININE 1.67* 1.52*  CALCIUM 7.7* 7.5*   PT/INR No results for input(s): LABPROT, INR in the last 72 hours. ABG No results for input(s): PHART, HCO3 in the last 72 hours.  Invalid input(s): PCO2, PO2  Studies/Results: Ct Abdomen Pelvis Wo Contrast  08/18/2015  CLINICAL DATA:  Abdominal pain with emesis for 1 month. History small bowel obstruction. Constant diarrhea. History of perforated duodenal ulcer. Vagotomy. Peptic ulcer disease. Down syndrome. History of duodenal ulcer with luminal stenosis. EXAM: CT ABDOMEN AND PELVIS WITHOUT CONTRAST TECHNIQUE: Multidetector CT imaging of the abdomen and pelvis was performed following the standard protocol without IV contrast. COMPARISON:  08/10/2015 FINDINGS: Lower chest: Mild motion degradation at the lung bases. Exam is degraded throughout by motion, lack of IV contrast, and support apparatus artifact, likely from telemetry device. Arms also not raised above the head. Grossly clear lung bases. Mild cardiomegaly with trace  bilateral pleural fluid or thickening. Hepatobiliary: Grossly stable appearance of the liver. Gallstone. Ill definition of pericholecystic fat planes, most consistent with acute cholecystitis. Dominant stone is within the gallbladder neck at 1.9 cm. Biliary tract not well evaluated. Pancreas: There is air within the pancreatic duct on image 22/ series 2. Edema about the pancreatic head suspected on image 25/series 2. Spleen: Normal in size, without focal abnormality. Adrenals/Urinary Tract: Grossly normal adrenal glands. Bilateral renal cortical thinning. No hydronephrosis. Normal urinary bladder. Stomach/Bowel: Surgical changes at the gastroesophageal junction. Status post Roux-en-Y gastrojejunostomy. Improvement in gastric distention with persistent luminal narrowing identified within the descending duodenum on image 28/series 2. The dilatation of the efferent loop has resolved. No residual inflammation identified. Grossly normal colon. Remainder of small bowel loops are normal in caliber. No pneumatosis or free intraperitoneal air. Vascular/Lymphatic: Normal caliber of the aorta and branch vessels. limited evaluation for retroperitoneal adenopathy. No pelvic sidewall adenopathy. Reproductive: Normal prostate. Other: No significant free fluid. Fat containing left paracentral ventral pelvic wall hernia. Musculoskeletal: Degenerative sclerosis of the left sacroiliac joint. Thoracolumbar degenerative disc disease and spondylosis which is advanced. IMPRESSION: 1. Moderate-to-marked multifactorial degradation. 2. Cholelithiasis with ill definition of pericholecystic fat planes, highly suspicious for acute cholecystitis. 3. Status post gastrojejunostomy with resolution of dilatation and inflammation involving the efferent loop. Persistent luminal narrowing involving the descending duodenum, likely the site of prior ulcer disease. 4. Air within the pancreatic duct is nonspecific. Correlate with prior instrumentation.  Cannot exclude peripancreatic edema. Consider correlation with pancreatic enzymes.  These results will be called to the ordering clinician or representative by the Radiologist Assistant, and communication documented in the PACS or zVision Dashboard. Electronically Signed   By: Abigail Miyamoto M.D.   On: 08/18/2015 22:11   US Abdomen Complete  08/19/2015  CLINICAL DATA:  Cholelithiasis, leukocytosis EXAM: ULTRASOUND ABDOMEN COMPLETE COMPARISON:  CT 08/18/2015 FINDINGS: Gallbladder: 1.5 cm non mobile gallstone within the neck of the gallbladder. Gallbladder wall markedly thickened at 15 mm. Common bile duct: Diameter: Difficult to visualize, 4 mm. Liver: No focal lesion identified. Within normal limits in parenchymal echogenicity. IVC: No abnormality visualized. Pancreas: Visualized portion unremarkable. Spleen: Size and appearance within normal limits. Right Kidney: Length: 9.9 cm. Mild right hydronephrosis. Mildly increased echotexture. Left Kidney: Length: 9.9 cm. Echogenicity within normal limits. No mass or hydronephrosis visualized. Abdominal aorta: No aneurysm visualized. Other findings: None. IMPRESSION: Cholelithiasis. Markedly thickened gallbladder wall suggesting acute cholecystitis. Mild right hydronephrosis, new since prior CT. Electronically Signed   By: Rolm Baptise M.D.   On: 08/19/2015 14:34    Anti-infectives: Anti-infectives    Start     Dose/Rate Route Frequency Ordered Stop   08/19/15 1100  vancomycin (VANCOCIN) 500 mg in sodium chloride 0.9 % 100 mL IVPB     500 mg 100 mL/hr over 60 Minutes Intravenous Every 24 hours 08/18/15 1005     08/18/15 1600  ciprofloxacin (CIPRO) IVPB 400 mg     400 mg 200 mL/hr over 60 Minutes Intravenous Every 24 hours 08/18/15 1448     08/18/15 0845  vancomycin (VANCOCIN) IVPB 750 mg/150 ml premix     750 mg 150 mL/hr over 60 Minutes Intravenous  Once 08/18/15 0835 08/18/15 1139      Assessment/Plan: s/p Procedure(s): ESOPHAGOGASTRODUODENOSCOPY  (EGD) (N/A)  Partial gastric outlet obstruction from ulcer is improving Ultrasound and exam consistent with cholecystitis with stone in the gallbladder neck and thick walled.  Will need a cholecystectomy vs a perc drain.  Given his previous surgery, laparoscopy may be difficult and he may need open surgery.  Will not plan on this until Monday.  His WBC is now normal.  Will continue diet and make him NPO Sunday night for potential procedure Monday.  LOS: 10 days    Shonta Phillis A 08/20/2015

## 2015-08-21 LAB — CBC WITH DIFFERENTIAL/PLATELET
BASOS ABS: 0.1 10*3/uL (ref 0.0–0.1)
Basophils Relative: 1 %
EOS ABS: 0.1 10*3/uL (ref 0.0–0.7)
Eosinophils Relative: 1 %
HCT: 26.1 % — ABNORMAL LOW (ref 39.0–52.0)
HEMOGLOBIN: 8.4 g/dL — AB (ref 13.0–17.0)
LYMPHS ABS: 0.8 10*3/uL (ref 0.7–4.0)
LYMPHS PCT: 8 %
MCH: 28.9 pg (ref 26.0–34.0)
MCHC: 32.2 g/dL (ref 30.0–36.0)
MCV: 89.7 fL (ref 78.0–100.0)
MONOS PCT: 13 %
Monocytes Absolute: 1.3 10*3/uL — ABNORMAL HIGH (ref 0.1–1.0)
Neutro Abs: 7.9 10*3/uL — ABNORMAL HIGH (ref 1.7–7.7)
Neutrophils Relative %: 77 %
PLATELETS: 454 10*3/uL — AB (ref 150–400)
RBC: 2.91 MIL/uL — AB (ref 4.22–5.81)
RDW: 16.6 % — ABNORMAL HIGH (ref 11.5–15.5)
WBC: 10.2 10*3/uL (ref 4.0–10.5)

## 2015-08-21 LAB — COMPREHENSIVE METABOLIC PANEL WITH GFR
ALT: 17 U/L (ref 17–63)
AST: 28 U/L (ref 15–41)
Albumin: 1.5 g/dL — ABNORMAL LOW (ref 3.5–5.0)
Alkaline Phosphatase: 88 U/L (ref 38–126)
Anion gap: 8 (ref 5–15)
BUN: 14 mg/dL (ref 6–20)
CO2: 25 mmol/L (ref 22–32)
Calcium: 7.7 mg/dL — ABNORMAL LOW (ref 8.9–10.3)
Chloride: 104 mmol/L (ref 101–111)
Creatinine, Ser: 1.39 mg/dL — ABNORMAL HIGH (ref 0.61–1.24)
GFR calc Af Amer: >60 mL/min
GFR calc non Af Amer: 53 mL/min — ABNORMAL LOW
Glucose, Bld: 100 mg/dL — ABNORMAL HIGH (ref 65–99)
Potassium: 4.6 mmol/L (ref 3.5–5.1)
Sodium: 137 mmol/L (ref 135–145)
Total Bilirubin: 0.4 mg/dL (ref 0.3–1.2)
Total Protein: 5.6 g/dL — ABNORMAL LOW (ref 6.5–8.1)

## 2015-08-21 LAB — SURGICAL PCR SCREEN
MRSA, PCR: NEGATIVE
Staphylococcus aureus: POSITIVE — AB

## 2015-08-21 LAB — GLUCOSE, CAPILLARY: Glucose-Capillary: 128 mg/dL — ABNORMAL HIGH (ref 65–99)

## 2015-08-21 MED ORDER — SODIUM CHLORIDE 0.9 % IV SOLN
Freq: Once | INTRAVENOUS | Status: AC
  Administered 2015-08-21: 06:00:00 via INTRAVENOUS

## 2015-08-21 NOTE — Progress Notes (Signed)
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 He was asked to come to ED, and his repeat Hemoglobin was found to be 7. He received 2 units of prbc transfusion and underwent CT of the abdomen. CT showed diffuse wall thickness and dilation of the Roux limb suggestive of gastric outlet obstruction. There was no evidence of perforation or hemoperitoneum on the imaging study. Dr. Barry Dienes of surgery was consulted from the emergency department and saw the patient, recommended Gi consultation and possible EGD. GI consult from [facility] requested. Of note his hemoccult was negative. Patient underwent upper endoscopy showing large jejunal also distal to GJ anastomosis with partial gastric outlet obstruction. Patient was tried on soft diet however was unable to tolerate and placed back on full liquid diet which he's been tolerating. Patient also noted to have a significant leukocytosis. Workup consistent with a UTI and possible acute cholecystitis. General surgery and ID following.   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 Patient currently tolerating full liquid diet with ensure. GI and general surgery following. Continue PPI. Outpatient follow-up.  #2 acute blood loss anemia/anemia of chronic disease Likely secondary to problem #1. Status post 2 units packed red blood cells. Hemoglobin currently stable at 8.4 from 9.0. Likely dilutional component. Transfusion threshold hemoglobin less than 7. Follow H&H.   #3 leukocytosis Patient with  a significant leukocytosis with a white count of 31.1 on 08/17/2015. Likely secondary to Klebsiella pneumoniae UTI and acute cholecystitis. WBC currently at 12.3. Patient with soft stools loose stools.. Patient had prior C. difficile PCR checked which was negative. Repeat chest x-ray negative for any acute infiltrate. Repeat C. difficile PCR negative. Urinalysis is nitrite negative, leukocytes negative, however cultures positive for klebsiella pneumonaie. Blood cultures with coagulase negative staph and likely contaminant. CT abdomen and pelvis as well as abdominal ultrasound consistent with acute cholecystitis. Continue IV ciprofloxacin and IV Flagyl. Patient for probable cholecystectomy tomorrow. General surgery and ID following.   #4 probable acute cholecystitis Per CT abdomen and pelvis and abdominal ultrasound. CT abdomen and pelvis consistent with acute cholecystitis with dominant stone within gallbladder neck at 1.9 cm. Blood cultures likely a contaminant. ID recommended discontinuing IV vancomycin and starting patient on Flagyl. Continue IV ciprofloxacin. Continue IV Flagyl. Patient for likely cholecystectomy tomorrow per general surgery. Patient has been made nothing by mouth after midnight. General surgery following.  #5 Klebsiella pneumoniae UTI IV ciprofloxacin.   #6 coagulase negative staph in blood cx Likely a contaminant as coagulase negative staph growing. D/C'd vancomycin. ID FF.   #7 acute on chronic kidney disease stage III Creatinine trending back down with hydration and currently at 1.39 from 1.52 from 1.67 from1.93 from 2.13 from 1.49 on 08/15/2015. Likely a prerenal azotemia as patient is noted to have borderline hypotension with some emesis and not tolerating good oral intake. Continue gentle hydration.  Follow.  #8 hypothyroidism TSH at 8.325. Free T4 within normal limits. Continue current dose of Synthroid. Repeat thyroid function studies in about 4-6 weeks with outpatient  follow-up.  #9 mild thrombocytosis Follow.  #10 prophylaxis  SCDs for DVT prophylaxis.  Code Status: Full Family Communication: Updated mother at bedside. Disposition Plan: Home once acute cholecystitis managed per [facility]..   Consultants:  Gastroenterology: Dr. Watt Climes 08/11/2015  Gen. surgery: Dr. Hulen Skains 7 2016  Infectious disease Dr. Johnnye Sima 08/18/2015  Procedures:  CT abdomen and pelvis 08/10/2015  Chest x-ray 08/17/2015  Upper endoscopy 08/11/2015  2 units packed red blood cells 08/11/2015, 08/12/2015  CT abd/pelvis 08/18/2015  Abdominal US 08/19/2015  Antibiotics:  IV vancomycin 08/18/2015>>>>08/20/2015  IV Ciprofloxacin 08/18/2015  IV Flagyl 08/20/2015  HPI/Subjective: Patient sitting up in bed keep eating applesauce. No chest pain. No shortness of breath. Denies any abdominal pain.  Objective: Filed Vitals:   08/21/15 0458 08/21/15 0826  BP: 82/40 100/40  Pulse:  78  Temp:  98.5 F (36.9 C)  Resp:  15    Intake/Output Summary (Last 24 hours) at 08/21/15 1407 Last data filed at 08/21/15 1259  Gross per 24 hour  Intake   2310 ml  Output    600 ml  Net   1710 ml   Filed Weights   08/18/15 2120 08/19/15 2114 08/20/15 2014  Weight: 52.617 kg (116 lb) 52.708 kg (116 lb 3.2 oz) 52.689 kg (116 lb 2.5 oz)    Exam:   General:  NAD  Cardiovascular: RRR  Respiratory: CTAB  Abdomen: Soft, less tender to palpation in RUQ, nondistended, positive bowel sounds.  Musculoskeletal: No clubbing cyanosis or edema.  Data Reviewed: Basic Metabolic Panel:  Recent Labs Lab 08/17/15 0523 08/18/15 0655 08/19/15 0727 08/20/15 0317 08/21/15 0358  NA 134* 140 139 138 137  K 4.0 3.9 4.0 3.9 4.6  CL 99* 111 111 110 104  CO2 25 20* 22 21* 25  GLUCOSE 97 93 92 105* 100*  BUN 24* 24* 20 14 14   CREATININE 2.13* 1.93* 1.67* 1.52* 1.39*  CALCIUM 8.1* 7.6* 7.7* 7.5* 7.7*   Liver Function Tests:  Recent Labs Lab 08/20/15 0317 08/21/15 0358  AST 25 28   ALT 17 17  ALKPHOS 64 88  BILITOT 0.3 0.4  PROT 4.9* 5.6*  ALBUMIN 1.4* 1.5*   No results for input(s): LIPASE, AMYLASE in the last 168 hours. No results for input(s): AMMONIA in the last 168 hours. CBC:  Recent Labs Lab 08/17/15 0835 08/18/15 0655 08/18/15 1537 08/19/15 0727 08/20/15 0317 08/21/15 0358  WBC 29.4* 22.3*  --  12.3* 10.1 10.2  NEUTROABS 27.3* 20.0*  --   --   --  7.9*  HGB 9.0* 7.7* 8.4* 8.0* 8.1* 8.4*  HCT 28.9* 24.5* 26.6* 25.5* 25.5* 26.1*  MCV 91.5 92.1  --  90.4 90.7 89.7  PLT 411* 391  --  388 392 454*   Cardiac Enzymes: No results for input(s): CKTOTAL, CKMB, CKMBINDEX, TROPONINI in the last 168 hours. BNP (last 3 results)  Recent Labs  08/11/15 0315  BNP 80.1    ProBNP (last 3 results) No results for input(s): PROBNP in the last 8760 hours.  CBG:  Recent Labs Lab 08/18/15 0744 08/19/15 0700 08/20/15 0022 08/20/15 0723 08/21/15 0745  GLUCAP 100* 120* 89 97 128*    Recent Results (from the past 240 hour(s))  C difficile quick scan w PCR reflex     Status: None   Collection Time: 08/12/15 12:38 AM  Result Value Ref Range Status   C Diff antigen NEGATIVE NEGATIVE Final   C Diff toxin NEGATIVE NEGATIVE Final   C Diff interpretation Negative for toxigenic C. difficile  Final  Culture, Urine  Status: None   Collection Time: 08/17/15 11:50 AM  Result Value Ref Range Status   Specimen Description URINE, CLEAN CATCH  Final   Special Requests NONE  Final   Culture 70,000 COLONIES/ml KLEBSIELLA PNEUMONIAE  Final   Report Status 08/19/2015 FINAL  Final   Organism ID, Bacteria KLEBSIELLA PNEUMONIAE  Final      Susceptibility   Klebsiella pneumoniae - MIC*    AMPICILLIN >=32 RESISTANT Resistant     CEFAZOLIN <=4 SENSITIVE Sensitive     CEFTRIAXONE <=1 SENSITIVE Sensitive     CIPROFLOXACIN <=0.25 SENSITIVE Sensitive     GENTAMICIN <=1 SENSITIVE Sensitive     IMIPENEM <=0.25 SENSITIVE Sensitive     NITROFURANTOIN 64 INTERMEDIATE  Intermediate     TRIMETH/SULFA <=20 SENSITIVE Sensitive     AMPICILLIN/SULBACTAM 8 SENSITIVE Sensitive     PIP/TAZO 8 SENSITIVE Sensitive     * 70,000 COLONIES/ml KLEBSIELLA PNEUMONIAE  Culture, blood (routine x 2)     Status: None (Preliminary result)   Collection Time: 08/17/15 12:05 PM  Result Value Ref Range Status   Specimen Description BLOOD RIGHT ARM  Final   Special Requests BOTTLES DRAWN AEROBIC AND ANAEROBIC 5CCS  Final   Culture NO GROWTH 4 DAYS  Final   Report Status PENDING  Incomplete  Culture, blood (routine x 2)     Status: None   Collection Time: 08/17/15 12:12 PM  Result Value Ref Range Status   Specimen Description BLOOD RIGHT HAND  Final   Special Requests BOTTLES DRAWN AEROBIC ONLY 3CCS  Final   Culture  Setup Time   Final    GRAM POSITIVE COCCI IN CLUSTERS AEROBIC BOTTLE ONLY CRITICAL RESULT CALLED TO, READ BACK BY AND VERIFIED WITH: J. NARAMBAS,RN AT [id number] ON [id number] BY Rhea Bleacher    Culture   Final    STAPHYLOCOCCUS SPECIES (COAGULASE NEGATIVE) THE SIGNIFICANCE OF ISOLATING THIS ORGANISM FROM A SINGLE SET OF BLOOD CULTURES WHEN MULTIPLE SETS ARE DRAWN IS UNCERTAIN. PLEASE NOTIFY THE MICROBIOLOGY DEPARTMENT WITHIN ONE WEEK IF SPECIATION AND SENSITIVITIES ARE REQUIRED.    Report Status 08/19/2015 FINAL  Final  C difficile quick scan w PCR reflex     Status: None   Collection Time: 08/17/15  2:09 PM  Result Value Ref Range Status   C Diff antigen NEGATIVE NEGATIVE Final   C Diff toxin NEGATIVE NEGATIVE Final   C Diff interpretation Negative for toxigenic C. difficile  Final  Culture, blood (Routine X 2) w Reflex to ID Panel     Status: None (Preliminary result)   Collection Time: 08/18/15  3:37 PM  Result Value Ref Range Status   Specimen Description BLOOD RIGHT HAND  Final   Special Requests IN PEDIATRIC BOTTLE 1.5CC  Final   Culture NO GROWTH 3 DAYS  Final   Report Status PENDING  Incomplete     Studies: US Abdomen Complete  08/19/2015  CLINICAL  DATA:  Cholelithiasis, leukocytosis EXAM: ULTRASOUND ABDOMEN COMPLETE COMPARISON:  CT 08/18/2015 FINDINGS: Gallbladder: 1.5 cm non mobile gallstone within the neck of the gallbladder. Gallbladder wall markedly thickened at 15 mm. Common bile duct: Diameter: Difficult to visualize, 4 mm. Liver: No focal lesion identified. Within normal limits in parenchymal echogenicity. IVC: No abnormality visualized. Pancreas: Visualized portion unremarkable. Spleen: Size and appearance within normal limits. Right Kidney: Length: 9.9 cm. Mild right hydronephrosis. Mildly increased echotexture. Left Kidney: Length: 9.9 cm. Echogenicity within normal limits. No mass or hydronephrosis visualized. Abdominal aorta: No aneurysm visualized. Other findings: None.  IMPRESSION: Cholelithiasis. Markedly thickened gallbladder wall suggesting acute cholecystitis. Mild right hydronephrosis, new since prior CT. Electronically Signed   By: Rolm Baptise M.D.   On: 08/19/2015 14:34    Scheduled Meds: . antiseptic oral rinse  7 mL Mouth Rinse q12n4p  . chlorhexidine  15 mL Mouth Rinse BID  . ciprofloxacin  400 mg Intravenous Q24H  . feeding supplement (ENSURE ENLIVE)  237 mL Oral BID BM  . levothyroxine  150 mcg Oral QAC breakfast  . loperamide  2 mg Oral BID  . metronidazole  500 mg Intravenous Q8H  . pantoprazole sodium  40 mg Oral BID  . sodium chloride  3 mL Intravenous Q12H  . sucralfate  1 g Oral QID   Continuous Infusions: . sodium chloride 50 mL/hr at 08/21/15 0840    Principal Problem:   Blood loss anemia Active Problems:   Duodenal ulcer   Bacteremia   Gastric out let obstruction   Hypothyroidism   Acute kidney injury superimposed on chronic kidney disease (HCC)   PUD (peptic ulcer disease)   Leukocytosis   Gastric outlet obstruction   Calculus of gallbladder with acute cholecystitis   UTI (urinary tract infection)    Time spent: 27 minutes    THOMPSON,DANIEL M.D. [facility] Pager [phone]. If  7PM-7AM, please contact night-coverage at [email or web address], password [facility] 08/21/2015, 2:07 PM  LOS: 11 days

## 2015-08-21 NOTE — Progress Notes (Signed)
10 Days Post-Op  Subjective: He looks great, no complaints of pain, doing well on full liquids.    Objective: Vital signs in last 24 hours: Temp:  [97.5 F (36.4 C)-98.7 F (37.1 C)] 98.5 F (36.9 C) (12/18 0826) Pulse Rate:  [78-97] 78 (12/18 0826) Resp:  [15-17] 15 (12/18 0826) BP: (82-116)/(40-62) 100/40 mmHg (12/18 0826) SpO2:  [97 %-100 %] 99 % (12/18 0826) Weight:  [52.689 kg (116 lb 2.5 oz)] 52.689 kg (116 lb 2.5 oz) (12/17 2014) Last BM Date: 08/19/15 1320 PO BM x 2 BP down some early AM, on CPAP WBC is normal, creatinine is improving, LFT's are normal. Intake/Output from previous day: 12/17 0701 - 12/18 0700 In: 2970 [P.O.:1320; I.V.:1150; IV Piggyback:500] Out: 700 [Urine:700] Intake/Output this shift: Total I/O In: 240 [P.O.:240] Out: -   General appearance: alert, cooperative and no distress GI: soft, non-tender; bowel sounds normal; no masses,  no organomegaly  Lab Results:   Recent Labs  08/20/15 0317 08/21/15 0358  WBC 10.1 10.2  HGB 8.1* 8.4*  HCT 25.5* 26.1*  PLT 392 454*    BMET  Recent Labs  08/20/15 0317 08/21/15 0358  NA 138 137  K 3.9 4.6  CL 110 104  CO2 21* 25  GLUCOSE 105* 100*  BUN 14 14  CREATININE 1.52* 1.39*  CALCIUM 7.5* 7.7*   PT/INR No results for input(s): LABPROT, INR in the last 72 hours.   Recent Labs Lab 08/20/15 0317 08/21/15 0358  AST 25 28  ALT 17 17  ALKPHOS 64 88  BILITOT 0.3 0.4  PROT 4.9* 5.6*  ALBUMIN 1.4* 1.5*     Lipase     Component Value Date/Time   LIPASE 31 08/10/2015 1359     Studies/Results: US Abdomen Complete  08/19/2015  CLINICAL DATA:  Cholelithiasis, leukocytosis EXAM: ULTRASOUND ABDOMEN COMPLETE COMPARISON:  CT 08/18/2015 FINDINGS: Gallbladder: 1.5 cm non mobile gallstone within the neck of the gallbladder. Gallbladder wall markedly thickened at 15 mm. Common bile duct: Diameter: Difficult to visualize, 4 mm. Liver: No focal lesion identified. Within normal limits in  parenchymal echogenicity. IVC: No abnormality visualized. Pancreas: Visualized portion unremarkable. Spleen: Size and appearance within normal limits. Right Kidney: Length: 9.9 cm. Mild right hydronephrosis. Mildly increased echotexture. Left Kidney: Length: 9.9 cm. Echogenicity within normal limits. No mass or hydronephrosis visualized. Abdominal aorta: No aneurysm visualized. Other findings: None. IMPRESSION: Cholelithiasis. Markedly thickened gallbladder wall suggesting acute cholecystitis. Mild right hydronephrosis, new since prior CT. Electronically Signed   By: Rolm Baptise M.D.   On: 08/19/2015 14:34    Medications: . antiseptic oral rinse  7 mL Mouth Rinse q12n4p  . chlorhexidine  15 mL Mouth Rinse BID  . ciprofloxacin  400 mg Intravenous Q24H  . feeding supplement (ENSURE ENLIVE)  237 mL Oral BID BM  . levothyroxine  150 mcg Oral QAC breakfast  . loperamide  2 mg Oral BID  . metronidazole  500 mg Intravenous Q8H  . pantoprazole sodium  40 mg Oral BID  . sodium chloride  3 mL Intravenous Q12H  . 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  He is not on heparin    LOS: 11 days    Isaac Ramirez 08/21/2015

## 2015-08-22 DIAGNOSIS — N390 Urinary tract infection, site not specified: Secondary | ICD-10-CM

## 2015-08-22 LAB — COMPREHENSIVE METABOLIC PANEL
ALK PHOS: 94 U/L (ref 38–126)
ALT: 18 U/L (ref 17–63)
ANION GAP: 8 (ref 5–15)
AST: 22 U/L (ref 15–41)
Albumin: 1.4 g/dL — ABNORMAL LOW (ref 3.5–5.0)
BUN: 14 mg/dL (ref 6–20)
CALCIUM: 7.7 mg/dL — AB (ref 8.9–10.3)
CO2: 28 mmol/L (ref 22–32)
CREATININE: 1.49 mg/dL — AB (ref 0.61–1.24)
Chloride: 102 mmol/L (ref 101–111)
GFR, EST AFRICAN AMERICAN: 56 mL/min — AB (ref >60)
GFR, EST NON AFRICAN AMERICAN: 49 mL/min — AB (ref >60)
Glucose, Bld: 98 mg/dL (ref 65–99)
Potassium: 4.4 mmol/L (ref 3.5–5.1)
SODIUM: 138 mmol/L (ref 135–145)
TOTAL PROTEIN: 4.9 g/dL — AB (ref 6.5–8.1)
Total Bilirubin: 0.3 mg/dL (ref 0.3–1.2)

## 2015-08-22 LAB — CBC
HCT: 24.4 % — ABNORMAL LOW (ref 39.0–52.0)
Hemoglobin: 7.6 g/dL — ABNORMAL LOW (ref 13.0–17.0)
MCH: 27.8 pg (ref 26.0–34.0)
MCHC: 31.1 g/dL (ref 30.0–36.0)
MCV: 89.4 fL (ref 78.0–100.0)
PLATELETS: 456 10*3/uL — AB (ref 150–400)
RBC: 2.73 MIL/uL — AB (ref 4.22–5.81)
RDW: 16.9 % — AB (ref 11.5–15.5)
WBC: 9.7 10*3/uL (ref 4.0–10.5)

## 2015-08-22 LAB — CULTURE, BLOOD (ROUTINE X 2): CULTURE: NO GROWTH

## 2015-08-22 LAB — GLUCOSE, CAPILLARY: Glucose-Capillary: 83 mg/dL (ref 65–99)

## 2015-08-22 MED ORDER — METRONIDAZOLE 500 MG PO TABS
500.0000 mg | ORAL_TABLET | Freq: Three times a day (TID) | ORAL | Status: DC
  Administered 2015-08-22 – 2015-08-24 (×5): 500 mg via ORAL
  Filled 2015-08-22 (×8): qty 1

## 2015-08-22 MED ORDER — MUPIROCIN 2 % EX OINT
1.0000 | TOPICAL_OINTMENT | Freq: Two times a day (BID) | CUTANEOUS | Status: DC
Start: 2015-08-22 — End: 2015-08-24
  Administered 2015-08-22 – 2015-08-24 (×6): 1 via NASAL
  Filled 2015-08-22: qty 22

## 2015-08-22 MED ORDER — CIPROFLOXACIN HCL 500 MG PO TABS
500.0000 mg | ORAL_TABLET | Freq: Two times a day (BID) | ORAL | Status: DC
  Administered 2015-08-22 – 2015-08-24 (×4): 500 mg via ORAL
  Filled 2015-08-22 (×4): qty 1

## 2015-08-22 MED ORDER — CHLORHEXIDINE GLUCONATE CLOTH 2 % EX PADS
6.0000 | MEDICATED_PAD | Freq: Every day | CUTANEOUS | Status: DC
  Administered 2015-08-24: 6 via TOPICAL

## 2015-08-22 NOTE — Progress Notes (Signed)
Physical Therapy Treatment Patient Details Name: Isaac Ramirez MRN: [id number] DOB: 07/09/1951 Today's Date: 08/22/2015    History of Present Illness Pt is a 64 yo male with history of Trisomy 27 presenting with a gastric outlet obstruction, acute blood loss anemia, leukocytosis and acute on chronic CKD.  Pt wears CPAP at night.  Pt now with loose stools.  Hgb 9.0 yesterday adn 7.7 today.     PT Comments    Pt functioning at [place] more for direction due to cognitive impairment. Suspect pt to be fine going home with family and receiving HHPT to progress ambulation from RW to indep without AD as PTA.  Follow Up Recommendations  Home health PT;Supervision for mobility/OOB     Equipment Recommendations  None recommended by PT    Recommendations for Other Services       Precautions / Restrictions Precautions Precautions: Fall Restrictions Weight Bearing Restrictions: No    Mobility  Bed Mobility Overal bed mobility: Needs Assistance Bed Mobility: Supine to Sit     Supine to sit: Min assist     General bed mobility comments: directional verbal and tactile cues  Transfers Overall transfer level: Needs assistance Equipment used: Rolling walker (2 wheeled) Transfers: Sit to/from Stand Sit to Stand: Min guard         General transfer comment: tactile and verbal directional v/c's  Ambulation/Gait Ambulation/Gait assistance: Min guard Ambulation Distance (Feet): 160 Feet Assistive device: Rolling walker (2 wheeled) Gait Pattern/deviations: Step-to pattern;Decreased stride length;Shuffle;Wide base of support Gait velocity: dec Gait velocity interpretation: Below normal speed for age/gender General Gait Details: assist for walker management around obstacles   Stairs            Wheelchair Mobility    Modified Rankin (Stroke Patients Only)       Balance Overall balance assessment: Needs assistance Sitting-balance support: Feet supported;No upper  extremity supported Sitting balance-Leahy Scale: Fair       Standing balance-Leahy Scale: Fair Standing balance comment: pt could stand statically without difficulty                    Cognition Arousal/Alertness: Awake/alert Behavior During Therapy: WFL for tasks assessed/performed Overall Cognitive Status: History of cognitive impairments - at baseline                      Exercises      General Comments        Pertinent Vitals/Pain Pain Assessment: No/denies pain    Home Living                      Prior Function            PT Goals (current goals can now be found in the care plan section) Progress towards PT goals: Progressing toward goals    Frequency  Min 3X/week    PT Plan Current plan remains appropriate    Co-evaluation             End of Session Equipment Utilized During Treatment: Gait belt Activity Tolerance: Patient tolerated treatment well Patient left: in bed;with call bell/phone within reach;with family/visitor present     Time: [id number] PT Time Calculation (min) (ACUTE ONLY): 23 min  Charges:  $Gait Training: 8-22 mins $Therapeutic Activity: 8-22 mins                    G Codes:      Kingsley Callander 08/22/2015, 3:05  PM  Kittie Plater, PT, DPT Pager #: [phone] Office #: [phone]

## 2015-08-22 NOTE — Care Management Important Message (Signed)
Important Message  Patient Details  Name: Isaac Ramirez MRN: [id number] Date of Birth: 1951/01/21   Medicare Important Message Given:  Yes    Madelena Maturin P Ray Gervasi 08/22/2015, 3:35 PM

## 2015-08-22 NOTE — Progress Notes (Signed)
Placed patient on CPAP via [facility] (Home mask) auto settings (5/20).

## 2015-08-22 NOTE — Progress Notes (Signed)
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 He was asked to come to ED, and his repeat Hemoglobin was found to be 7. He received 2 units of prbc transfusion and underwent CT of the abdomen. CT showed diffuse wall thickness and dilation of the Roux limb suggestive of gastric outlet obstruction. There was no evidence of perforation or hemoperitoneum on the imaging study. Dr. Barry Dienes of surgery was consulted from the emergency department and saw the patient, recommended Gi consultation and possible EGD. GI consult from [facility] requested. Of note his hemoccult was negative. Patient underwent upper endoscopy showing large jejunal also distal to GJ anastomosis with partial gastric outlet obstruction. Patient was tried on soft diet however was unable to tolerate and placed back on full liquid diet which he's been tolerating. Patient also noted to have a significant leukocytosis. Workup consistent with a UTI and possible acute cholecystitis. General surgery and ID following.   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 Patient currently tolerating full liquid diet with ensure. GI and general surgery following. Continue PPI. Outpatient follow-up.  #2 acute blood loss anemia/anemia of chronic disease Likely secondary to problem #1. Status post 2 units packed red blood cells. Hemoglobin currently stable at 7.6 from 8.4 from 9.0. Likely dilutional component. Transfusion threshold hemoglobin less than 7. Follow H&H.   #3  leukocytosis Patient with a significant leukocytosis with a white count of 31.1 on 08/17/2015. Likely secondary to Klebsiella pneumoniae UTI and acute cholecystitis. WBC currently at 9.7. Patient with soft stools loose stools.. Patient had prior C. difficile PCR checked which was negative. Repeat chest x-ray negative for any acute infiltrate. Repeat C. difficile PCR negative. Urinalysis is nitrite negative, leukocytes negative, however cultures positive for klebsiella pneumonaie. Blood cultures with coagulase negative staph and likely contaminant. CT abdomen and pelvis as well as abdominal ultrasound consistent with acute cholecystitis. Change IV ciprofloxacin and Flagyl to oral ciprofloxacin and Flagyl. Patient has been seen by general surgery recommended that as patient is asymptomatic to try to advance diet as tolerated and monitor on oral antibiotics. General surgery and ID following.   #4 probable acute cholecystitis Per CT abdomen and pelvis and abdominal ultrasound. CT abdomen and pelvis consistent with acute cholecystitis with dominant stone within gallbladder neck at 1.9 cm. Blood cultures likely a contaminant. ID recommended discontinuing IV vancomycin and starting patient on Flagyl. Continue IV ciprofloxacin. Continue IV Flagyl. Patient was reassessed by general surgery today patient is asymptomatic and recommending medical management with antibiotics, advancing diet, reassessing to see if patient develops symptoms. Will place patient back on full liquid diet and monitor. General surgery following.  #5 Klebsiella pneumoniae UTI Change IV ciprofloxacin to oral ciprofloxacin.  #6 coagulase negative staph in blood cx Likely a contaminant as coagulase negative staph growing. D/C'd vancomycin. ID FF.   #7 acute on chronic kidney disease stage III Creatinine trending back down with hydration and currently at 1.49 from 1.39 from 1.52 from 1.67 from1.93 from 2.13 from 1.49 on 08/15/2015. Likely a  prerenal azotemia as patient is noted to have borderline hypotension  with some emesis and not tolerating good oral intake. Continue gentle hydration.  Follow.  #8 hypothyroidism TSH at 8.325. Free T4 within normal limits. Continue current dose of Synthroid. Repeat thyroid function studies in about 4-6 weeks with outpatient follow-up.  #9 mild thrombocytosis Follow.  #10 prophylaxis SCDs for DVT prophylaxis.  Code Status: Full Family Communication: Updated family at bedside. Disposition Plan: Home once acute cholecystitis managed per [facility]..   Consultants:  Gastroenterology: Dr. Watt Climes 08/11/2015  Gen. surgery: Dr. Hulen Skains 7 2016  Infectious disease Dr. Johnnye Sima 08/18/2015  Procedures:  CT abdomen and pelvis 08/10/2015  Chest x-ray 08/17/2015  Upper endoscopy 08/11/2015  2 units packed red blood cells 08/11/2015, 08/12/2015  CT abd/pelvis 08/18/2015  Abdominal US 08/19/2015  Antibiotics:  IV vancomycin 08/18/2015>>>>08/20/2015  IV Ciprofloxacin 08/18/2015>>> oral ciprofloxacin 08/22/2015  IV Flagyl 08/20/2015>>>> oral ciprofloxacin 08/22/2015  HPI/Subjective: Patient denies abdominal pain, no CP, no SOB.  Objective: Filed Vitals:   08/22/15 0606 08/22/15 0828  BP: 94/60 91/53  Pulse: 68 68  Temp: 97.5 F (36.4 C) 98.1 F (36.7 C)  Resp: 18 17    Intake/Output Summary (Last 24 hours) at 08/22/15 1439 Last data filed at 08/22/15 1423  Gross per 24 hour  Intake   1940 ml  Output    400 ml  Net   1540 ml   Filed Weights   08/18/15 2120 08/19/15 2114 08/20/15 2014  Weight: 52.617 kg (116 lb) 52.708 kg (116 lb 3.2 oz) 52.689 kg (116 lb 2.5 oz)    Exam:   General:  NAD  Cardiovascular: RRR  Respiratory: CTAB  Abdomen: Soft, less tender to palpation in RUQ, nondistended, positive bowel sounds.  Musculoskeletal: No clubbing cyanosis or edema.  Data Reviewed: Basic Metabolic Panel:  Recent Labs Lab 08/18/15 0655 08/19/15 0727 08/20/15 0317  08/21/15 0358 08/22/15 0409  NA 140 139 138 137 138  K 3.9 4.0 3.9 4.6 4.4  CL 111 111 110 104 102  CO2 20* 22 21* 25 28  GLUCOSE 93 92 105* 100* 98  BUN 24* 20 14 14 14   CREATININE 1.93* 1.67* 1.52* 1.39* 1.49*  CALCIUM 7.6* 7.7* 7.5* 7.7* 7.7*   Liver Function Tests:  Recent Labs Lab 08/20/15 0317 08/21/15 0358 08/22/15 0409  AST 25 28 22   ALT 17 17 18   ALKPHOS 64 88 94  BILITOT 0.3 0.4 0.3  PROT 4.9* 5.6* 4.9*  ALBUMIN 1.4* 1.5* 1.4*   No results for input(s): LIPASE, AMYLASE in the last 168 hours. No results for input(s): AMMONIA in the last 168 hours. CBC:  Recent Labs Lab 08/17/15 0835 08/18/15 0655 08/18/15 1537 08/19/15 0727 08/20/15 0317 08/21/15 0358 08/22/15 0409  WBC 29.4* 22.3*  --  12.3* 10.1 10.2 9.7  NEUTROABS 27.3* 20.0*  --   --   --  7.9*  --   HGB 9.0* 7.7* 8.4* 8.0* 8.1* 8.4* 7.6*  HCT 28.9* 24.5* 26.6* 25.5* 25.5* 26.1* 24.4*  MCV 91.5 92.1  --  90.4 90.7 89.7 89.4  PLT 411* 391  --  388 392 454* 456*   Cardiac Enzymes: No results for input(s): CKTOTAL, CKMB, CKMBINDEX, TROPONINI in the last 168 hours. BNP (last 3 results)  Recent Labs  08/11/15 0315  BNP 80.1    ProBNP (last 3 results) No results for input(s): PROBNP in the last 8760 hours.  CBG:  Recent Labs Lab 08/19/15 0700 08/20/15 0022 08/20/15 0723 08/21/15 0745 08/22/15 0758  GLUCAP 120* 89 97 128* 83    Recent  Results (from the past 240 hour(s))  Culture, Urine     Status: None   Collection Time: 08/17/15 11:50 AM  Result Value Ref Range Status   Specimen Description URINE, CLEAN CATCH  Final   Special Requests NONE  Final   Culture 70,000 COLONIES/ml KLEBSIELLA PNEUMONIAE  Final   Report Status 08/19/2015 FINAL  Final   Organism ID, Bacteria KLEBSIELLA PNEUMONIAE  Final      Susceptibility   Klebsiella pneumoniae - MIC*    AMPICILLIN >=32 RESISTANT Resistant     CEFAZOLIN <=4 SENSITIVE Sensitive     CEFTRIAXONE <=1 SENSITIVE Sensitive      CIPROFLOXACIN <=0.25 SENSITIVE Sensitive     GENTAMICIN <=1 SENSITIVE Sensitive     IMIPENEM <=0.25 SENSITIVE Sensitive     NITROFURANTOIN 64 INTERMEDIATE Intermediate     TRIMETH/SULFA <=20 SENSITIVE Sensitive     AMPICILLIN/SULBACTAM 8 SENSITIVE Sensitive     PIP/TAZO 8 SENSITIVE Sensitive     * 70,000 COLONIES/ml KLEBSIELLA PNEUMONIAE  Culture, blood (routine x 2)     Status: None   Collection Time: 08/17/15 12:05 PM  Result Value Ref Range Status   Specimen Description BLOOD RIGHT ARM  Final   Special Requests BOTTLES DRAWN AEROBIC AND ANAEROBIC 5CCS  Final   Culture NO GROWTH 5 DAYS  Final   Report Status 08/22/2015 FINAL  Final  Culture, blood (routine x 2)     Status: None   Collection Time: 08/17/15 12:12 PM  Result Value Ref Range Status   Specimen Description BLOOD RIGHT HAND  Final   Special Requests BOTTLES DRAWN AEROBIC ONLY 3CCS  Final   Culture  Setup Time   Final    GRAM POSITIVE COCCI IN CLUSTERS AEROBIC BOTTLE ONLY CRITICAL RESULT CALLED TO, READ BACK BY AND VERIFIED WITH: J. NARAMBAS,RN AT [id number] ON [id number] BY Rhea Bleacher    Culture   Final    STAPHYLOCOCCUS SPECIES (COAGULASE NEGATIVE) THE SIGNIFICANCE OF ISOLATING THIS ORGANISM FROM A SINGLE SET OF BLOOD CULTURES WHEN MULTIPLE SETS ARE DRAWN IS UNCERTAIN. PLEASE NOTIFY THE MICROBIOLOGY DEPARTMENT WITHIN ONE WEEK IF SPECIATION AND SENSITIVITIES ARE REQUIRED.    Report Status 08/19/2015 FINAL  Final  C difficile quick scan w PCR reflex     Status: None   Collection Time: 08/17/15  2:09 PM  Result Value Ref Range Status   C Diff antigen NEGATIVE NEGATIVE Final   C Diff toxin NEGATIVE NEGATIVE Final   C Diff interpretation Negative for toxigenic C. difficile  Final  Culture, blood (Routine X 2) w Reflex to ID Panel     Status: None (Preliminary result)   Collection Time: 08/18/15  3:37 PM  Result Value Ref Range Status   Specimen Description BLOOD RIGHT HAND  Final   Special Requests IN PEDIATRIC BOTTLE 1.5CC   Final   Culture NO GROWTH 4 DAYS  Final   Report Status PENDING  Incomplete  Surgical PCR screen     Status: Abnormal   Collection Time: 08/21/15  8:21 PM  Result Value Ref Range Status   MRSA, PCR NEGATIVE NEGATIVE Final   Staphylococcus aureus POSITIVE (A) NEGATIVE Final    Comment:        The Xpert SA Assay (FDA approved for NASAL specimens in patients over 67 years of age), is one component of a comprehensive surveillance program.  Test performance has been validated by [facility] for patients greater than or equal to 82 year old. It is not intended to diagnose infection  nor to guide or monitor treatment.      Studies: No results found.  Scheduled Meds: . antiseptic oral rinse  7 mL Mouth Rinse q12n4p  . chlorhexidine  15 mL Mouth Rinse BID  . Chlorhexidine Gluconate Cloth  6 each Topical Daily  . ciprofloxacin  500 mg Oral BID  . feeding supplement (ENSURE ENLIVE)  237 mL Oral BID BM  . levothyroxine  150 mcg Oral QAC breakfast  . loperamide  2 mg Oral BID  . metroNIDAZOLE  500 mg Oral 3 times per day  . mupirocin ointment  1 application Nasal BID  . pantoprazole sodium  40 mg Oral BID  . sodium chloride  3 mL Intravenous Q12H  . sucralfate  1 g Oral QID   Continuous Infusions: . sodium chloride 75 mL/hr at 08/21/15 1654    Principal Problem:   Blood loss anemia Active Problems:   Duodenal ulcer   Bacteremia   Gastric out let obstruction   Hypothyroidism   Acute kidney injury superimposed on chronic kidney disease (HCC)   PUD (peptic ulcer disease)   Leukocytosis   Gastric outlet obstruction   Calculus of gallbladder with acute cholecystitis   UTI (urinary tract infection)    Time spent: 64 minutes    Carly Sabo M.D. [facility] Pager [phone]. If 7PM-7AM, please contact night-coverage at [email or web address], password [facility] 08/22/2015, 2:39 PM  LOS: 12 days

## 2015-08-22 NOTE — Progress Notes (Signed)
RT assisted pt. With placing on cpap. Pt. Tolerating well at this time.

## 2015-08-22 NOTE — Progress Notes (Signed)
11 Days Post-Op  Subjective: Patient is completely asymptomatic - was tolerating full liquids until midnight No nausea or vomiting BM yesterday  Objective: Vital signs in last 24 hours: Temp:  [97.5 F (36.4 C)-98.6 F (37 C)] 98.1 F (36.7 C) (12/19 0828) Pulse Rate:  [68-107] 68 (12/19 0828) Resp:  [16-18] 17 (12/19 0828) BP: (91-111)/(53-62) 91/53 mmHg (12/19 0828) SpO2:  [81 %-100 %] 100 % (12/19 0828) Last BM Date: 08/21/15  Intake/Output from previous day: 12/18 0701 - 12/19 0700 In: 2060 [P.O.:960; I.V.:900; IV Piggyback:200] Out: 600 [Urine:600] Intake/Output this shift:    General appearance: alert, cooperative and no distress GI: soft, non-tender; bowel sounds normal; no masses,  no organomegaly  Lab Results:   Recent Labs  08/21/15 0358 08/22/15 0409  WBC 10.2 9.7  HGB 8.4* 7.6*  HCT 26.1* 24.4*  PLT 454* 456*   BMET  Recent Labs  08/21/15 0358 08/22/15 0409  NA 137 138  K 4.6 4.4  CL 104 102  CO2 25 28  GLUCOSE 100* 98  BUN 14 14  CREATININE 1.39* 1.49*  CALCIUM 7.7* 7.7*   PT/INR No results for input(s): LABPROT, INR in the last 72 hours. ABG No results for input(s): PHART, HCO3 in the last 72 hours.  Invalid input(s): PCO2, PO2  Studies/Results: No results found.  Anti-infectives: Anti-infectives    Start     Dose/Rate Route Frequency Ordered Stop   08/20/15 1800  metroNIDAZOLE (FLAGYL) IVPB 500 mg     500 mg 100 mL/hr over 60 Minutes Intravenous Every 8 hours 08/20/15 1643     08/19/15 1100  vancomycin (VANCOCIN) 500 mg in sodium chloride 0.9 % 100 mL IVPB  Status:  Discontinued     500 mg 100 mL/hr over 60 Minutes Intravenous Every 24 hours 08/18/15 1005 08/20/15 1643   08/18/15 1600  ciprofloxacin (CIPRO) IVPB 400 mg     400 mg 200 mL/hr over 60 Minutes Intravenous Every 24 hours 08/18/15 1448     08/18/15 0845  vancomycin (VANCOCIN) IVPB 750 mg/150 ml premix     750 mg 150 mL/hr over 60 Minutes Intravenous  Once 08/18/15  0835 08/18/15 1139      Assessment/Plan: s/p Procedure(s): ESOPHAGOGASTRODUODENOSCOPY (EGD) (N/A) I spent some time with his caregivers discussing the situation.  Currently he is asymptomatic and improved on antibiotics.  Would switch to PO antibiotics Advance diet as tolerated  I discussed the pathophysiology of cholecystitis and the options for treatment.  Percutaneous cholecystostomy would be difficult for the patient to understand and for the caregivers to manage.  Laparoscopic cholecystectomy would be ideal but there is a higher than usual likelihood of conversion to open procedure because of his previous open Roux-en-Y gastrojejunostomy and Stamm gastrostomy.  At this point, since he is asymptomatic, I think we should attempt to continue antibiotics and advance diet as tolerated.  If he becomes symptomatic, we will proceed with surgery later this week.   LOS: 12 days    Moshe Wenger K. 08/22/2015

## 2015-08-23 LAB — CULTURE, BLOOD (ROUTINE X 2): CULTURE: NO GROWTH

## 2015-08-23 LAB — BASIC METABOLIC PANEL
Anion gap: 7 (ref 5–15)
BUN: 12 mg/dL (ref 6–20)
CALCIUM: 7.7 mg/dL — AB (ref 8.9–10.3)
CO2: 27 mmol/L (ref 22–32)
CREATININE: 1.38 mg/dL — AB (ref 0.61–1.24)
Chloride: 105 mmol/L (ref 101–111)
GFR calc Af Amer: >60 mL/min (ref >60)
GFR calc non Af Amer: 53 mL/min — ABNORMAL LOW (ref >60)
GLUCOSE: 84 mg/dL (ref 65–99)
Potassium: 4.1 mmol/L (ref 3.5–5.1)
Sodium: 139 mmol/L (ref 135–145)

## 2015-08-23 LAB — CBC
HEMATOCRIT: 26.1 % — AB (ref 39.0–52.0)
Hemoglobin: 8.5 g/dL — ABNORMAL LOW (ref 13.0–17.0)
MCH: 29.2 pg (ref 26.0–34.0)
MCHC: 32.6 g/dL (ref 30.0–36.0)
MCV: 89.7 fL (ref 78.0–100.0)
Platelets: 502 10*3/uL — ABNORMAL HIGH (ref 150–400)
RBC: 2.91 MIL/uL — ABNORMAL LOW (ref 4.22–5.81)
RDW: 16.9 % — AB (ref 11.5–15.5)
WBC: 7.7 10*3/uL (ref 4.0–10.5)

## 2015-08-23 LAB — GLUCOSE, CAPILLARY: Glucose-Capillary: 97 mg/dL (ref 65–99)

## 2015-08-23 NOTE — Progress Notes (Signed)
12 Days Post-Op  Subjective: He seems to be tolerating full liquids well, he denies any pain with them.  Exam is without pain.  Objective: Vital signs in last 24 hours: Temp:  [97.6 F (36.4 C)-98.9 F (37.2 C)] 97.6 F (36.4 C) (12/20 0818) Pulse Rate:  [80-100] 80 (12/20 0818) Resp:  [16-17] 16 (12/20 0818) BP: (99-114)/(67-72) 114/72 mmHg (12/20 0818) SpO2:  [97 %-100 %] 100 % (12/20 0818) Last BM Date: 08/22/15 680 PO Full liquids +BM Afebrile, VSS Creatinine stable H/H is stable Intake/Output from previous day: 12/19 0701 - 12/20 0700 In: 2470 [P.O.:680; I.V.:1790] Out: 3 [Urine:2; Stool:1] Intake/Output this shift:    General appearance: alert, cooperative and no distress GI: soft, non-tender; bowel sounds normal; no masses,  no organomegaly  Lab Results:   Recent Labs  08/22/15 0409 08/23/15 0630  WBC 9.7 7.7  HGB 7.6* 8.5*  HCT 24.4* 26.1*  PLT 456* 502*    BMET  Recent Labs  08/22/15 0409 08/23/15 0630  NA 138 139  K 4.4 4.1  CL 102 105  CO2 28 27  GLUCOSE 98 84  BUN 14 12  CREATININE 1.49* 1.38*  CALCIUM 7.7* 7.7*   PT/INR No results for input(s): LABPROT, INR in the last 72 hours.   Recent Labs Lab 08/20/15 0317 08/21/15 0358 08/22/15 0409  AST 25 28 22   ALT 17 17 18   ALKPHOS 64 88 94  BILITOT 0.3 0.4 0.3  PROT 4.9* 5.6* 4.9*  ALBUMIN 1.4* 1.5* 1.4*     Lipase     Component Value Date/Time   LIPASE 31 08/10/2015 1359     Studies/Results: No results found.  Medications: . antiseptic oral rinse  7 mL Mouth Rinse q12n4p  . chlorhexidine  15 mL Mouth Rinse BID  . Chlorhexidine Gluconate Cloth  6 each Topical Daily  . ciprofloxacin  500 mg Oral BID  . feeding supplement (ENSURE ENLIVE)  237 mL Oral BID BM  . levothyroxine  150 mcg Oral QAC breakfast  . loperamide  2 mg Oral BID  . metroNIDAZOLE  500 mg Oral 3 times per day  . mupirocin ointment  1 application Nasal BID  . pantoprazole sodium  40 mg Oral BID  . sodium  chloride  3 mL Intravenous Q12H  . sucralfate  1 g Oral QID    Assessment/Plan Partial gastric obstruction with large jejunal ulcer distal to GJ anastomosis (on liquids & PPI) Cholecystitis and stone in gallbladder neck S/p Roux-en-y with vagotomy, Stamm gastrostomy 2007 Trisomy 21 Hx of bleeding peptic ulcers  Severe anemia Positive blood culture - contaminate Klebsiella UTI Acute on chronic kidney disease Stage III Hypothyroid Antibiotics: Cipro day 6, Flagyl, day 4, had vancomycin for 3 days DVT: SCD  Plan:  He is now on oral Cipro and Flagyl, tolerating this and full liquids well.  Would recommend we try soft diet and see how he does.  This is where he failed last time.  We will continue to follow with you.      LOS: 13 days    Jhanvi Drakeford 08/23/2015

## 2015-08-23 NOTE — Progress Notes (Signed)
Occupational Therapy Treatment Patient Details Name: Isaac Ramirez MRN: [id number] DOB: Mar 04, 1951 Today's Date: 08/23/2015    History of present illness Pt is a 64 yo male with history of Trisomy 21 presenting with a gastric outlet obstruction, acute blood loss anemia, leukocytosis and acute on chronic CKD.  Pt wears CPAP at night.  Pt now with loose stools.  Hgb 9.0 yesterday adn 7.7 today.    OT comments  Pt demonstrates balance deficits with static standing during oral care and bil UE use. Pt requires 1 UE support at sink level. Pt progressing toward goals and very pleasant.   Follow Up Recommendations  Supervision/Assistance - 24 hour;Home health OT    Equipment Recommendations  None recommended by OT    Recommendations for Other Services      Precautions / Restrictions Precautions Precautions: Fall Restrictions Weight Bearing Restrictions: No       Mobility Bed Mobility               General bed mobility comments: in chair on arrival  Transfers Overall transfer level: Needs assistance   Transfers: Sit to/from Stand Sit to Stand: Min guard              Balance Overall balance assessment: Needs assistance           Standing balance-Leahy Scale: Fair                     ADL       Grooming: Oral care;Wash/dry face;Wash/dry hands;Minimal assistance;Standing Grooming Details (indicate cue type and reason): pt with min (A) due to balance deficits. pt needed (A) for sequencing task                 Toilet Transfer: Minimal assistance           Functional mobility during ADLs: Moderate assistance General ADL Comments: pt required hand held (A) ambulating to sink level for oral care. pt needed (A) to redirect to task and to correct chair to sit      Vision                     Perception     Praxis      Cognition   Behavior During Therapy: [facility] for tasks assessed/performed Overall Cognitive Status: History  of cognitive impairments - at baseline                       Extremity/Trunk Assessment               Exercises     Shoulder Instructions       General Comments      Pertinent Vitals/ Pain       Pain Assessment: No/denies pain  Home Living                                          Prior Functioning/Environment              Frequency Min 2X/week     Progress Toward Goals  OT Goals(current goals can now be found in the care plan section)  Progress towards OT goals: Progressing toward goals  Acute Rehab OT Goals Patient Stated Goal: to get strong OT Goal Formulation: With patient Time For Goal Achievement: 09/01/15 Potential to Achieve Goals: Good ADL Goals Pt Will Perform Grooming:  with supervision;standing Pt Will Perform Lower Body Bathing: with supervision;sit to/from stand Pt Will Perform Lower Body Dressing: with supervision;sit to/from stand Pt Will Perform Tub/Shower Transfer: Tub transfer;with supervision;ambulating;shower seat Additional ADL Goal #1: Pt will walk to bathroom and toilet with S.  Plan Discharge plan remains appropriate    Co-evaluation                 End of Session     Activity Tolerance Patient tolerated treatment well   Patient Left in chair;with call bell/phone within reach;with family/visitor present   Nurse Communication Mobility status        Time: 1040-1100 OT Time Calculation (min): 20 min  Charges: OT General Charges $OT Visit: 1 Procedure OT Treatments $Self Care/Home Management : 8-22 mins  Parke Poisson B 08/23/2015, 11:21 AM  Jeri Modena   OTR/L Pager: [phone] Office: [phone] .

## 2015-08-23 NOTE — Progress Notes (Signed)
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 He was asked to come to ED, and his repeat Hemoglobin was found to be 7. He received 2 units of prbc transfusion and underwent CT of the abdomen. CT showed diffuse wall thickness and dilation of the Roux limb suggestive of gastric outlet obstruction. There was no evidence of perforation or hemoperitoneum on the imaging study. Dr. Barry Dienes of surgery was consulted from the emergency department and saw the patient, recommended Gi consultation and possible EGD. GI consult from [facility] requested. Of note his hemoccult was negative. Patient underwent upper endoscopy showing large jejunal also distal to GJ anastomosis with partial gastric outlet obstruction. Patient was tried on soft diet however was unable to tolerate and placed back on full liquid diet which he's been tolerating. Patient also noted to have a significant leukocytosis. Workup consistent with a UTI and possible acute cholecystitis.  Patient has been seen by general surgery and feel patient is currently asymptomatic. Patient diet has been advanced to a soft diet. Patient's IV antibiotics have been transitioned to oral antibiotics. If patient is symptomatic on soft diet will likely need a cholecystectomy. General surgery and ID following.   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 Patient currently tolerating full liquid diet with ensure. GI and general surgery following. Diet to be advanced to a soft diet per general surgery.  Continue PPI. Outpatient follow-up.  #2 acute blood loss anemia/anemia of chronic disease Likely secondary to problem #1. Status post 2 units packed red blood cells. Hemoglobin currently stable at 8.5 from 7.6 from 8.4 from 9.0. Likely dilutional component. Transfusion threshold hemoglobin less than 7. Follow H&H.   #3 leukocytosis Patient with a significant leukocytosis with a white count of 31.1 on 08/17/2015. Likely secondary to Klebsiella pneumoniae UTI and acute cholecystitis. WBC currently at 7.7. Patient with soft stools loose stools.. Patient had prior C. difficile PCR checked which was negative. Repeat chest x-ray negative for any acute infiltrate. Repeat C. difficile PCR negative. Urinalysis is nitrite negative, leukocytes negative, however cultures positive for klebsiella pneumonaie. Blood cultures with coagulase negative staph and likely contaminant. CT abdomen and pelvis as well as abdominal ultrasound consistent with acute cholecystitis. Continue oral ciprofloxacin and Flagyl. Patient has been seen by general surgery recommended that as patient is asymptomatic to try to advance diet as tolerated and monitor on oral antibiotics. General surgery following.   #4 probable acute cholecystitis Per CT abdomen and pelvis and abdominal ultrasound. CT abdomen and pelvis consistent with acute cholecystitis with dominant stone within gallbladder neck at 1.9 cm. Blood cultures likely a contaminant. ID recommended discontinuing IV vancomycin and starting patient on Flagyl. IV ciprofloxacin and IV Flagyl have been transitioned to oral ciprofloxacin and oral Flagyl per general surgery recommendations. Patient was reassessed by general surgery, patient deemed to be asymptomatic and recommending medical management with antibiotics, advancing diet, reassessing to see if patient develops symptoms. Patient currently on a soft diet. Per general surgery.   #5 Klebsiella pneumoniae UTI Continue oral  ciprofloxacin.  #6 coagulase negative staph in blood cx  Likely a contaminant as coagulase negative staph growing. D/C'd vancomycin. ID FF.   #7 acute on chronic kidney disease stage III Creatinine trending back down with hydration and currently at 1.38 from 1.49 from 1.39 from 1.52 from 1.67 from1.93 from 2.13 from 1.49 on 08/15/2015. Likely a prerenal azotemia as patient is noted to have borderline hypotension with some emesis and not tolerating good oral intake. Continue gentle hydration.  Follow.  #8 hypothyroidism TSH at 8.325. Free T4 within normal limits. Continue current dose of Synthroid. Repeat thyroid function studies in about 4-6 weeks with outpatient follow-up.  #9 mild thrombocytosis Follow.  #10 prophylaxis SCDs for DVT prophylaxis.  Code Status: Full Family Communication: Updated family at bedside. Disposition Plan: Home once acute cholecystitis managed per [facility]..   Consultants:  Gastroenterology: Dr. Watt Climes 08/11/2015  Gen. surgery: Dr. Hulen Skains 7 2016  Infectious disease Dr. Johnnye Sima 08/18/2015  Procedures:  CT abdomen and pelvis 08/10/2015  Chest x-ray 08/17/2015  Upper endoscopy 08/11/2015  2 units packed red blood cells 08/11/2015, 08/12/2015  CT abd/pelvis 08/18/2015  Abdominal US 08/19/2015  Antibiotics:  IV vancomycin 08/18/2015>>>>08/20/2015  IV Ciprofloxacin 08/18/2015>>> oral ciprofloxacin 08/22/2015  IV Flagyl 08/20/2015>>>> oral Flagyl 08/22/2015  HPI/Subjective: Patient denies abdominal pain, no CP, no SOB. Patient tolerating full liquids. Patient has been advanced to a soft diet and has not received it yet.  Objective: Filed Vitals:   08/23/15 0430 08/23/15 0818  BP: 99/71 114/72  Pulse: 100 80  Temp: 98.3 F (36.8 C) 97.6 F (36.4 C)  Resp: 16 16    Intake/Output Summary (Last 24 hours) at 08/23/15 1424 Last data filed at 08/23/15 [id number]  Gross per 24 hour  Intake   2230 ml  Output      1 ml  Net   2229 ml   Filed Weights    08/18/15 2120 08/19/15 2114 08/20/15 2014  Weight: 52.617 kg (116 lb) 52.708 kg (116 lb 3.2 oz) 52.689 kg (116 lb 2.5 oz)    Exam:   General:  NAD  Cardiovascular: RRR  Respiratory: CTAB  Abdomen: Soft, non tender to palpation in RUQ, nondistended, positive bowel sounds.  Musculoskeletal: No clubbing cyanosis or edema.  Data Reviewed: Basic Metabolic Panel:  Recent Labs Lab 08/19/15 0727 08/20/15 0317 08/21/15 0358 08/22/15 0409 08/23/15 0630  NA 139 138 137 138 139  K 4.0 3.9 4.6 4.4 4.1  CL 111 110 104 102 105  CO2 22 21* 25 28 27   GLUCOSE 92 105* 100* 98 84  BUN 20 14 14 14 12   CREATININE 1.67* 1.52* 1.39* 1.49* 1.38*  CALCIUM 7.7* 7.5* 7.7* 7.7* 7.7*   Liver Function Tests:  Recent Labs Lab 08/20/15 0317 08/21/15 0358 08/22/15 0409  AST 25 28 22   ALT 17 17 18   ALKPHOS 64 88 94  BILITOT 0.3 0.4 0.3  PROT 4.9* 5.6* 4.9*  ALBUMIN 1.4* 1.5* 1.4*   No results for input(s): LIPASE, AMYLASE in the last 168 hours. No results for input(s): AMMONIA in the last 168 hours. CBC:  Recent Labs Lab 08/17/15 0835 08/18/15 0655  08/19/15 0727 08/20/15 0317 08/21/15 0358 08/22/15 0409 08/23/15 0630  WBC 29.4* 22.3*  --  12.3* 10.1 10.2 9.7 7.7  NEUTROABS 27.3* 20.0*  --   --   --  7.9*  --   --   HGB 9.0* 7.7*  < > 8.0* 8.1* 8.4* 7.6* 8.5*  HCT 28.9* 24.5*  < > 25.5* 25.5* 26.1* 24.4* 26.1*  MCV 91.5 92.1  --  90.4 90.7 89.7 89.4 89.7  PLT 411* 391  --  388 392 454* 456* 502*  < > = values in this interval not displayed. Cardiac Enzymes: No results for input(s): CKTOTAL, CKMB, CKMBINDEX, TROPONINI in the last 168 hours. BNP (last 3 results)  Recent Labs  08/11/15 0315  BNP 80.1    ProBNP (last 3 results) No results for input(s): PROBNP in the last 8760 hours.  CBG:  Recent Labs Lab 08/20/15 0022 08/20/15 0723 08/21/15 0745 08/22/15 0758 08/23/15 0757  GLUCAP 89 97 128* 83 97    Recent Results (from the past 240 hour(s))  Culture,  Urine     Status: None   Collection Time: 08/17/15 11:50 AM  Result Value Ref Range Status   Specimen Description URINE, CLEAN CATCH  Final   Special Requests NONE  Final   Culture 70,000 COLONIES/ml KLEBSIELLA PNEUMONIAE  Final   Report Status 08/19/2015 FINAL  Final   Organism ID, Bacteria KLEBSIELLA PNEUMONIAE  Final      Susceptibility   Klebsiella pneumoniae - MIC*    AMPICILLIN >=32 RESISTANT Resistant     CEFAZOLIN <=4 SENSITIVE Sensitive     CEFTRIAXONE <=1 SENSITIVE Sensitive     CIPROFLOXACIN <=0.25 SENSITIVE Sensitive     GENTAMICIN <=1 SENSITIVE Sensitive     IMIPENEM <=0.25 SENSITIVE Sensitive     NITROFURANTOIN 64 INTERMEDIATE Intermediate     TRIMETH/SULFA <=20 SENSITIVE Sensitive     AMPICILLIN/SULBACTAM 8 SENSITIVE Sensitive     PIP/TAZO 8 SENSITIVE Sensitive     * 70,000 COLONIES/ml KLEBSIELLA PNEUMONIAE  Culture, blood (routine x 2)     Status: None   Collection Time: 08/17/15 12:05 PM  Result Value Ref Range Status   Specimen Description BLOOD RIGHT ARM  Final   Special Requests BOTTLES DRAWN AEROBIC AND ANAEROBIC 5CCS  Final   Culture NO GROWTH 5 DAYS  Final   Report Status 08/22/2015 FINAL  Final  Culture, blood (routine x 2)     Status: None   Collection Time: 08/17/15 12:12 PM  Result Value Ref Range Status   Specimen Description BLOOD RIGHT HAND  Final   Special Requests BOTTLES DRAWN AEROBIC ONLY 3CCS  Final   Culture  Setup Time   Final    GRAM POSITIVE COCCI IN CLUSTERS AEROBIC BOTTLE ONLY CRITICAL RESULT CALLED TO, READ BACK BY AND VERIFIED WITH: J. NARAMBAS,RN AT [id number] ON [id number] BY Rhea Bleacher    Culture   Final    STAPHYLOCOCCUS SPECIES (COAGULASE NEGATIVE) THE SIGNIFICANCE OF ISOLATING THIS ORGANISM FROM A SINGLE SET OF BLOOD CULTURES WHEN MULTIPLE SETS ARE DRAWN IS UNCERTAIN. PLEASE NOTIFY THE MICROBIOLOGY DEPARTMENT WITHIN ONE WEEK IF SPECIATION AND SENSITIVITIES ARE REQUIRED.    Report Status 08/19/2015 FINAL  Final  C difficile quick scan  w PCR reflex     Status: None   Collection Time: 08/17/15  2:09 PM  Result Value Ref Range Status   C Diff antigen NEGATIVE NEGATIVE Final   C Diff toxin NEGATIVE NEGATIVE Final   C Diff interpretation Negative for toxigenic C. difficile  Final  Culture, blood (Routine X 2) w Reflex to ID Panel     Status: None   Collection Time: 08/18/15  3:37 PM  Result Value Ref Range Status   Specimen Description BLOOD RIGHT HAND  Final   Special Requests IN PEDIATRIC BOTTLE 1.5CC  Final   Culture NO GROWTH 5 DAYS  Final   Report Status 08/23/2015 FINAL  Final  Surgical  PCR screen     Status: Abnormal   Collection Time: 08/21/15  8:21 PM  Result Value Ref Range Status   MRSA, PCR NEGATIVE NEGATIVE Final   Staphylococcus aureus POSITIVE (A) NEGATIVE Final    Comment:        The Xpert SA Assay (FDA approved for NASAL specimens in patients over 73 years of age), is one component of a comprehensive surveillance program.  Test performance has been validated by [facility] for patients greater than or equal to 37 year old. It is not intended to diagnose infection nor to guide or monitor treatment.      Studies: No results found.  Scheduled Meds: . antiseptic oral rinse  7 mL Mouth Rinse q12n4p  . chlorhexidine  15 mL Mouth Rinse BID  . Chlorhexidine Gluconate Cloth  6 each Topical Daily  . ciprofloxacin  500 mg Oral BID  . feeding supplement (ENSURE ENLIVE)  237 mL Oral BID BM  . levothyroxine  150 mcg Oral QAC breakfast  . loperamide  2 mg Oral BID  . metroNIDAZOLE  500 mg Oral 3 times per day  . mupirocin ointment  1 application Nasal BID  . pantoprazole sodium  40 mg Oral BID  . sodium chloride  3 mL Intravenous Q12H  . sucralfate  1 g Oral QID   Continuous Infusions:    Principal Problem:   Blood loss anemia Active Problems:   Duodenal ulcer   Bacteremia   Gastric out let obstruction   Hypothyroidism   Acute kidney injury superimposed on chronic kidney disease (HCC)    PUD (peptic ulcer disease)   Leukocytosis   Gastric outlet obstruction   Calculus of gallbladder with acute cholecystitis   UTI (urinary tract infection)    Time spent: 77 minutes    Ricke Kimoto M.D. [facility] Pager [phone]. If 7PM-7AM, please contact night-coverage at [email or web address], password [facility] 08/23/2015, 2:24 PM  LOS: 13 days

## 2015-08-24 DIAGNOSIS — K8000 Calculus of gallbladder with acute cholecystitis without obstruction: Secondary | ICD-10-CM

## 2015-08-24 LAB — COMPREHENSIVE METABOLIC PANEL
ALK PHOS: 103 U/L (ref 38–126)
ALT: 13 U/L — AB (ref 17–63)
AST: 20 U/L (ref 15–41)
Albumin: 1.5 g/dL — ABNORMAL LOW (ref 3.5–5.0)
Anion gap: 8 (ref 5–15)
BILIRUBIN TOTAL: 0.2 mg/dL — AB (ref 0.3–1.2)
BUN: 11 mg/dL (ref 6–20)
CALCIUM: 7.9 mg/dL — AB (ref 8.9–10.3)
CO2: 29 mmol/L (ref 22–32)
CREATININE: 1.54 mg/dL — AB (ref 0.61–1.24)
Chloride: 105 mmol/L (ref 101–111)
GFR, EST AFRICAN AMERICAN: 54 mL/min — AB (ref >60)
GFR, EST NON AFRICAN AMERICAN: 47 mL/min — AB (ref >60)
Glucose, Bld: 99 mg/dL (ref 65–99)
Potassium: 3.9 mmol/L (ref 3.5–5.1)
Sodium: 142 mmol/L (ref 135–145)
Total Protein: 5.5 g/dL — ABNORMAL LOW (ref 6.5–8.1)

## 2015-08-24 LAB — CBC
HCT: 25.8 % — ABNORMAL LOW (ref 39.0–52.0)
Hemoglobin: 8.1 g/dL — ABNORMAL LOW (ref 13.0–17.0)
MCH: 28.3 pg (ref 26.0–34.0)
MCHC: 31.4 g/dL (ref 30.0–36.0)
MCV: 90.2 fL (ref 78.0–100.0)
PLATELETS: 534 10*3/uL — AB (ref 150–400)
RBC: 2.86 MIL/uL — AB (ref 4.22–5.81)
RDW: 17 % — ABNORMAL HIGH (ref 11.5–15.5)
WBC: 4.9 10*3/uL (ref 4.0–10.5)

## 2015-08-24 LAB — MAGNESIUM: MAGNESIUM: 1.9 mg/dL (ref 1.7–2.4)

## 2015-08-24 LAB — GLUCOSE, CAPILLARY: Glucose-Capillary: 91 mg/dL (ref 65–99)

## 2015-08-24 MED ORDER — PANTOPRAZOLE SODIUM 40 MG PO PACK: 40.0000 mg | PACK | Freq: Two times a day (BID) | ORAL | Status: AC

## 2015-08-24 MED ORDER — CIPROFLOXACIN HCL 500 MG PO TABS
500.0000 mg | ORAL_TABLET | Freq: Two times a day (BID) | ORAL | Status: DC
Start: 2015-08-24 — End: 2015-12-15

## 2015-08-24 MED ORDER — SUCRALFATE 1 GM/10ML PO SUSP: 1.0000 g | Freq: Four times a day (QID) | ORAL | Status: DC

## 2015-08-24 MED ORDER — ENSURE ENLIVE PO LIQD: 237.0000 mL | Freq: Two times a day (BID) | ORAL | Status: AC

## 2015-08-24 MED ORDER — METRONIDAZOLE 500 MG PO TABS: 500.0000 mg | ORAL_TABLET | Freq: Three times a day (TID) | ORAL | Status: DC

## 2015-08-24 NOTE — Progress Notes (Signed)
Isaac Ramirez to be D/C'd Home per MD order.  Discussed prescriptions and follow up appointments with the patient. Prescriptions given to patient's mother, medication list explained in detail. Mother verbalized understanding.    Medication List    STOP taking these medications        esomeprazole 40 MG capsule  Commonly known as:  [place] these medications        ciprofloxacin 500 MG tablet  Commonly known as:  CIPRO  Take 1 tablet (500 mg total) by mouth 2 (two) times daily.     feeding supplement (ENSURE ENLIVE) Liqd  Take 237 mLs by mouth 2 (two) times daily between meals.     levothyroxine 150 MCG tablet  Commonly known as:  SYNTHROID, LEVOTHROID  Take 150 mcg by mouth daily before breakfast.     metroNIDAZOLE 500 MG tablet  Commonly known as:  FLAGYL  Take 1 tablet (500 mg total) by mouth every 8 (eight) hours.     pantoprazole sodium 40 mg/20 mL Pack  Commonly known as:  PROTONIX  Take 20 mLs (40 mg total) by mouth 2 (two) times daily.     sucralfate 1 GM/10ML suspension  Commonly known as:  CARAFATE  Take 10 mLs (1 g total) by mouth 4 (four) times daily.        Filed Vitals:   08/24/15 0516 08/24/15 0845  BP: 112/71 102/60  Pulse: 72 76  Temp: 97.9 F (36.6 C) 98.2 F (36.8 C)  Resp: 18 18    Skin clean, dry and intact without evidence of skin break down, no evidence of skin tears noted. IV catheter discontinued intact. Site without signs and symptoms of complications. Dressing and pressure applied. Pt denies pain at this time. No complaints noted.  An After Visit Summary was printed and given to the patient's mother. Patient escorted via [place], and D/C home via private auto.  [name] A 08/24/2015 12:59 PM

## 2015-08-24 NOTE — Progress Notes (Signed)
[facility] Progress Note  13 Days Post-Op  Subjective: Pt doing much better.  No N/V, tolerating solid diet well. Had a BM and passing good flatus.  Very intent on watching TV.  Wants to go home.  Family at bedside say they can help take care of him.    Objective: Vital signs in last 24 hours: Temp:  [97.9 F (36.6 C)-98.7 F (37.1 C)] 98.2 F (36.8 C) (12/21 0845) Pulse Rate:  [72-80] 76 (12/21 0845) Resp:  [17-18] 18 (12/21 0845) BP: (97-112)/(60-71) 102/60 mmHg (12/21 0845) SpO2:  [100 %] 100 % (12/21 0845) Last BM Date: 08/22/15  Intake/Output from previous day: 12/20 0701 - 12/21 0700 In: 720 [P.O.:720] Out: 2 [Urine:2] Intake/Output this shift: Total I/O In: 240 [P.O.:240] Out: 100 [Urine:100]  PE: Gen:  Alert, NAD, pleasant Abd: Soft, NT/ND, +BS, no HSM   Lab Results:   Recent Labs  08/23/15 0630 08/24/15 0845  WBC 7.7 4.9  HGB 8.5* 8.1*  HCT 26.1* 25.8*  PLT 502* 534*   BMET  Recent Labs  08/23/15 0630 08/24/15 0845  NA 139 142  K 4.1 3.9  CL 105 105  CO2 27 29  GLUCOSE 84 99  BUN 12 11  CREATININE 1.38* 1.54*  CALCIUM 7.7* 7.9*   PT/INR No results for input(s): LABPROT, INR in the last 72 hours. CMP     Component Value Date/Time   NA 142 08/24/2015 0845   K 3.9 08/24/2015 0845   CL 105 08/24/2015 0845   CO2 29 08/24/2015 0845   GLUCOSE 99 08/24/2015 0845   BUN 11 08/24/2015 0845   CREATININE 1.54* 08/24/2015 0845   CALCIUM 7.9* 08/24/2015 0845   PROT 5.5* 08/24/2015 0845   ALBUMIN 1.5* 08/24/2015 0845   AST 20 08/24/2015 0845   ALT 13* 08/24/2015 0845   ALKPHOS 103 08/24/2015 0845   BILITOT 0.2* 08/24/2015 0845   GFRNONAA 47* 08/24/2015 0845   GFRAA 54* 08/24/2015 0845   Lipase     Component Value Date/Time   LIPASE 31 08/10/2015 1359       Studies/Results: No results found.  Anti-infectives: Anti-infectives    Start     Dose/Rate Route Frequency Ordered Stop   08/22/15 1800  metroNIDAZOLE (FLAGYL)  tablet 500 mg     500 mg Oral 3 times per day 08/22/15 1438     08/22/15 1600  ciprofloxacin (CIPRO) tablet 500 mg     500 mg Oral 2 times daily 08/22/15 1438     08/20/15 1800  metroNIDAZOLE (FLAGYL) IVPB 500 mg  Status:  Discontinued     500 mg 100 mL/hr over 60 Minutes Intravenous Every 8 hours 08/20/15 1643 08/22/15 1438   08/19/15 1100  vancomycin (VANCOCIN) 500 mg in sodium chloride 0.9 % 100 mL IVPB  Status:  Discontinued     500 mg 100 mL/hr over 60 Minutes Intravenous Every 24 hours 08/18/15 1005 08/20/15 1643   08/18/15 1600  ciprofloxacin (CIPRO) IVPB 400 mg  Status:  Discontinued     400 mg 200 mL/hr over 60 Minutes Intravenous Every 24 hours 08/18/15 1448 08/22/15 1438   08/18/15 0845  vancomycin (VANCOCIN) IVPB 750 mg/150 ml premix     750 mg 150 mL/hr over 60 Minutes Intravenous  Once 08/18/15 0835 08/18/15 1139       Assessment/Plan Partial gastric obstruction with large jejunal ulcer distal to GJ anastomosis Cholecystitis and stone in gallbladder neck -Oral cipro/flagyl, finish 7 day course -Tolerated soft diet well,  don't think at this point he will need surgery this admission -Recommend soft/low fat/GERD diet to prevent further gallbladder attacks and aggravation of ulcers -No NSAIDS -PPI and carafate for ulcer -Okay to d/c home from our perspective, Can follow up with Dr. 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

## 2015-08-25 NOTE — Discharge Summary (Signed)
Physician Discharge Summary  Isaac Ramirez Space [id number] DOB: August 22, 1951 DOA: 08/10/2015  PCP: Donnie Coffin, MD  Admit date: 08/10/2015 Discharge date: 08/24/2015  Time spent: 30 minutes  Recommendations for Outpatient Follow-up:  1. Follow up with surgery in 2 to 3 weeks.  2. Please follow up with GI as recommended.  3. Follow up with thyroid panel in 4 weeks.  4. Follow up with PCP in one week and get cbc and BMP in one week.    Discharge Diagnoses:  Principal Problem:   Blood loss anemia Active Problems:   Gastric out let obstruction   Hypothyroidism   Acute kidney injury superimposed on chronic kidney disease (HCC)   PUD (peptic ulcer disease)   Leukocytosis   Duodenal ulcer   Gastric outlet obstruction   Bacteremia   Calculus of gallbladder with acute cholecystitis   UTI (urinary tract infection)   Discharge Condition: improved.  Diet recommendation: soft/ low fat diet.   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 He was asked to come to ED, and his repeat Hemoglobin was found to be 7. He received 2 units of prbc transfusion and underwent CT of the abdomen. CT showed diffuse wall thickness and dilation of the Roux limb suggestive of gastric outlet obstruction. There was no evidence of perforation or hemoperitoneum on the imaging study. Dr. Barry Dienes of surgery was consulted from the emergency department and saw the patient, recommended Gi consultation and possible EGD. GI consult from [facility] requested. Of note his hemoccult was negative. Patient underwent  upper endoscopy showing large jejunal also distal to GJ anastomosis with partial gastric outlet obstruction. Patient was tried on soft diet however was unable to tolerate and placed back on full liquid diet which he's been tolerating. Patient also noted to have a significant leukocytosis. Workup consistent with a UTI and possible acute cholecystitis.  Patient has been seen by general surgery and feel patient is currently asymptomatic. Patient diet has been advanced to a soft diet. Patient's IV antibiotics have been transitioned to oral antibiotics.   Hospital Course:  #1 partial gastric outlet obstruction with large jejunal ulcer distal to GJ anastomosis Patient status post upper endoscopy. Patient currently tolerating full liquid diet with ensure. GI and general surgery following. Diet  advanced to a soft diet per general surgery. Continue PPI. Outpatient follow-up.  #2 acute blood loss anemia/anemia of chronic disease Likely secondary to problem #1. Status post 2 units packed red blood cells. Hemoglobin currently stable at 8.5 from 7.6 from 8.4 from 9.0. Likely dilutional component. Transfusion threshold hemoglobin less than 7. Follow H&H.   #3 leukocytosis Patient with a significant leukocytosis with a white count of 31.1 on 08/17/2015. Likely secondary to Klebsiella pneumoniae UTI and acute cholecystitis. WBC currently at 7.7. Patient with soft stools loose stools.. Patient had prior C. difficile PCR checked which was negative. Repeat chest x-ray negative for any acute infiltrate. Repeat C. difficile PCR negative. Urinalysis is nitrite negative, leukocytes negative, however cultures positive for klebsiella pneumonaie. Blood cultures with coagulase negative staph and  likely contaminant. CT abdomen and pelvis as well as abdominal ultrasound consistent with acute cholecystitis. Continue oral ciprofloxacin and Flagyl. Patient has been seen by general surgery recommended that as patient is asymptomatic  to try to advance diet as tolerated and monitor on oral antibiotics.  Plan for discharge on soft diet.   #4 probable acute cholecystitis Per CT abdomen and pelvis and abdominal ultrasound. CT abdomen and pelvis consistent with acute cholecystitis with dominant stone within gallbladder neck at 1.9 cm. Blood cultures likely a contaminant. ID recommended discontinuing IV vancomycin and starting patient on Flagyl. IV ciprofloxacin and IV Flagyl have been transitioned to oral ciprofloxacin and oral Flagyl per general surgery recommendations. Patient was reassessed by general surgery, patient deemed to be asymptomatic and recommending medical management with antibiotics, advanced diet, .Patient currently on a soft diet.   #5 Klebsiella pneumoniae UTI Continue oral ciprofloxacin to complete the course.   #6 coagulase negative staph in blood cx Likely a contaminant as coagulase negative staph growing. D/C'd vancomycin. ID FF.   #7 acute on chronic kidney disease stage III Creatinine trending back down with hydration and currently at 1.38 from 1.49 from 1.39 from 1.52 from 1.67 from1.93 from 2.13 from 1.49 on 08/15/2015. Likely a prerenal azotemia as patient is noted to have borderline hypotension with some emesis. Recommend checking renal parameters in one week.  #8 hypothyroidism TSH at 8.325. Free T4 within normal limits. Continue current dose of Synthroid. Repeat thyroid function studies in about 4-6 weeks with outpatient follow-up.  #9 mild thrombocytosis Follow.  Procedures:  none  Consultations:  GI  surgery  Discharge Exam: Filed Vitals:   08/24/15 0516 08/24/15 0845  BP: 112/71 102/60  Pulse: 72 76  Temp: 97.9 F (36.6 C) 98.2 F (36.8 C)  Resp: 18 18    General: alert afebrile comfortable. Cardiovascular: s1s2 Respiratory: ctab  Discharge Instructions   Discharge Instructions    Call MD for:  extreme fatigue    Complete by:  As directed      Call MD for:  persistant  dizziness or light-headedness    Complete by:  As directed      Call MD for:  persistant nausea and vomiting    Complete by:  As directed      Call MD for:  severe uncontrolled pain    Complete by:  As directed      Discharge instructions    Complete by:  As directed   Please follow up with surgery in 2 to 3 weeks .  Follow soft and low fat diet.          Discharge Medication List as of 08/24/2015 12:43 PM    START taking these medications   Details  ciprofloxacin (CIPRO) 500 MG tablet Take 1 tablet (500 mg total) by mouth 2 (two) times daily., Starting 08/24/2015, Until Discontinued, Print    feeding supplement, ENSURE ENLIVE, (ENSURE ENLIVE) LIQD Take 237 mLs by mouth 2 (two) times daily between meals., Starting 08/24/2015, Until Discontinued, Normal    metroNIDAZOLE (FLAGYL) 500 MG tablet Take 1 tablet (500 mg total) by mouth every 8 (eight) hours., Starting 08/24/2015, Until Discontinued, Print    pantoprazole sodium (PROTONIX) 40 mg/20 mL PACK Take 20 mLs (40 mg total) by mouth 2 (two) times daily., Starting 08/24/2015, Until Discontinued, Print    sucralfate (CARAFATE) 1 GM/10ML suspension Take 10 mLs (1 g total) by mouth 4 (four) times daily., Starting 08/24/2015, Until Discontinued, Print      CONTINUE  these medications which have NOT CHANGED   Details  levothyroxine (SYNTHROID, LEVOTHROID) 150 MCG tablet Take 150 mcg by mouth daily before breakfast., Starting 07/31/2015, Until Discontinued, Historical Med      STOP taking these medications     esomeprazole (NEXIUM) 40 MG capsule        Allergies  Allergen Reactions  . Penicillins Anaphylaxis    Has patient had a PCN reaction causing immediate rash, facial/tongue/throat swelling, SOB or lightheadedness with hypotension: Yes Has patient had a PCN reaction causing severe rash involving mucus membranes or skin necrosis: No Has patient had a PCN reaction that required hospitalization No Has patient had a PCN reaction  occurring within the last 10 years: No If all of the above answers are "NO", then may proceed with Cephalosporin use.  . Sulfa Antibiotics    Follow-up Information    Follow up with Donnie Coffin, MD. Schedule an appointment as soon as possible for a visit in 1 week.   Specialty:  Family Medicine   Contact information:   [address] [place] [address] [phone]       Follow up with Judeth Horn, MD. Schedule an appointment as soon as possible for a visit in 2 weeks.   Specialty:  General Surgery   Contact information:   [place] [address] [phone]        The results of significant diagnostics from this hospitalization (including imaging, microbiology, ancillary and laboratory) are listed below for reference.    Significant Diagnostic Studies: Ct Abdomen Pelvis Wo Contrast  08/18/2015  CLINICAL DATA:  Abdominal pain with emesis for 1 month. History small bowel obstruction. Constant diarrhea. History of perforated duodenal ulcer. Vagotomy. Peptic ulcer disease. Down syndrome. History of duodenal ulcer with luminal stenosis. EXAM: CT ABDOMEN AND PELVIS WITHOUT CONTRAST TECHNIQUE: Multidetector CT imaging of the abdomen and pelvis was performed following the standard protocol without IV contrast. COMPARISON:  08/10/2015 FINDINGS: Lower chest: Mild motion degradation at the lung bases. Exam is degraded throughout by motion, lack of IV contrast, and support apparatus artifact, likely from telemetry device. Arms also not raised above the head. Grossly clear lung bases. Mild cardiomegaly with trace bilateral pleural fluid or thickening. Hepatobiliary: Grossly stable appearance of the liver. Gallstone. Ill definition of pericholecystic fat planes, most consistent with acute cholecystitis. Dominant stone is within the gallbladder neck at 1.9 cm. Biliary tract not well evaluated. Pancreas: There is air within the pancreatic duct on image 22/  series 2. Edema about the pancreatic head suspected on image 25/series 2. Spleen: Normal in size, without focal abnormality. Adrenals/Urinary Tract: Grossly normal adrenal glands. Bilateral renal cortical thinning. No hydronephrosis. Normal urinary bladder. Stomach/Bowel: Surgical changes at the gastroesophageal junction. Status post Roux-en-Y gastrojejunostomy. Improvement in gastric distention with persistent luminal narrowing identified within the descending duodenum on image 28/series 2. The dilatation of the efferent loop has resolved. No residual inflammation identified. Grossly normal colon. Remainder of small bowel loops are normal in caliber. No pneumatosis or free intraperitoneal air. Vascular/Lymphatic: Normal caliber of the aorta and branch vessels. limited evaluation for retroperitoneal adenopathy. No pelvic sidewall adenopathy. Reproductive: Normal prostate. Other: No significant free fluid. Fat containing left paracentral ventral pelvic wall hernia. Musculoskeletal: Degenerative sclerosis of the left sacroiliac joint. Thoracolumbar degenerative disc disease and spondylosis which is advanced. IMPRESSION: 1. Moderate-to-marked multifactorial degradation. 2. Cholelithiasis with ill definition of pericholecystic fat planes, highly suspicious for acute cholecystitis. 3. Status post gastrojejunostomy with  resolution of dilatation and inflammation involving the efferent loop. Persistent luminal narrowing involving the descending duodenum, likely the site of prior ulcer disease. 4. Air within the pancreatic duct is nonspecific. Correlate with prior instrumentation. Cannot exclude peripancreatic edema. Consider correlation with pancreatic enzymes. These results will be called to the ordering clinician or representative by the Radiologist Assistant, and communication documented in the PACS or zVision Dashboard. Electronically Signed   By: Abigail Miyamoto M.D.   On: 08/18/2015 22:11   US Abdomen  Complete  08/19/2015  CLINICAL DATA:  Cholelithiasis, leukocytosis EXAM: ULTRASOUND ABDOMEN COMPLETE COMPARISON:  CT 08/18/2015 FINDINGS: Gallbladder: 1.5 cm non mobile gallstone within the neck of the gallbladder. Gallbladder wall markedly thickened at 15 mm. Common bile duct: Diameter: Difficult to visualize, 4 mm. Liver: No focal lesion identified. Within normal limits in parenchymal echogenicity. IVC: No abnormality visualized. Pancreas: Visualized portion unremarkable. Spleen: Size and appearance within normal limits. Right Kidney: Length: 9.9 cm. Mild right hydronephrosis. Mildly increased echotexture. Left Kidney: Length: 9.9 cm. Echogenicity within normal limits. No mass or hydronephrosis visualized. Abdominal aorta: No aneurysm visualized. Other findings: None. IMPRESSION: Cholelithiasis. Markedly thickened gallbladder wall suggesting acute cholecystitis. Mild right hydronephrosis, new since prior CT. Electronically Signed   By: Rolm Baptise M.D.   On: 08/19/2015 14:34   Ct Abdomen Pelvis W Contrast  08/10/2015  CLINICAL DATA:  History of stomach ulcers. Low hemoglobin. Abdominal pain for 1 week. EXAM: CT ABDOMEN AND PELVIS WITH CONTRAST TECHNIQUE: Multidetector CT imaging of the abdomen and pelvis was performed using the standard protocol following bolus administration of intravenous contrast. CONTRAST:  69mL OMNIPAQUE IOHEXOL 300 MG/ML  SOLN COMPARISON:  01/18/2006. FINDINGS: Lower chest: No pleural effusion identified. The lung bases appear clear. Hepatobiliary: There is no focal liver abnormality. Multiple stones are identified within the gallbladder. The largest measures 1.9 cm. No biliary dilatation. Pancreas: The pancreas is unremarkable. Spleen: Negative. Adrenals/Urinary Tract: The adrenal glands are normal. Unremarkable appearance of the kidneys. The urinary bladder appears normal. Stomach/Bowel: There are postsurgical changes previous Roux-en-Y gastrojejunostomy. There is moderate distension  of the gastric lumen which is worrisome for gastric outlet obstruction. At the gastro jejunal anastomosis there is abnormal mucosal enhancement with the appearance of significant luminal narrowing. The Roux limb is diffusely distended with diffuse mucosal enhancement and marked inflammation and edema of the wall. The mid and distal small bowel loops are unremarkable. The proximal colon is normal. There is abnormal wall thickening involving transverse colon as it approximates the Roux limb compatible with secondary inflammation. No definite evidence for bowel perforation. No abscess noted at this time. Vascular/Lymphatic: Normal appearance of the abdominal aorta. No enlarged retroperitoneal or mesenteric adenopathy. No enlarged pelvic or inguinal lymph nodes. Reproductive: Prostate gland and seminal vesicles are unremarkable. Other: There is no free intraperitoneal air identified. There is no free fluid or abnormal fluid collections identified. Musculoskeletal: No aggressive lytic or sclerotic bone lesions. There is multi level degenerative disc disease identified within the lower thoracic and throughout the lumbar spine. IMPRESSION: 1. Postoperative appearance of the upper GI tract compatible with previous Roux-en-Y gastrojejunostomy. The Roux limb appears diffusely abnormal with marked wall thickening and inflammation as well as luminal distention. The distension of the stomach and Roux limb are worrisome for gastric outlet obstruction at the level of the gastrojejunostomy as well as potential obstruction at the jejunal enteric anastomosis. Etiology unknown but may be related to peptic ulcer disease. Underlying ischemia cannot be excluded. 2. No evidence for bowel perforation  or abscess formation. No free intraperitoneal air identified. Electronically Signed   By: Kerby Moors M.D.   On: 08/10/2015 18:51   Dg Chest Port 1 View  08/17/2015  CLINICAL DATA:  Fever today.  Initial encounter. EXAM: PORTABLE CHEST  1 VIEW COMPARISON:  PA and lateral chest 01/18/2006. FINDINGS: The lungs are clear. Heart size is normal. No pneumothorax or pleural effusion. Marked degenerative change about the shoulders is noted. IMPRESSION: No acute disease. Electronically Signed   By: Inge Rise M.D.   On: 08/17/2015 12:29    Microbiology: Recent Results (from the past 240 hour(s))  Culture, Urine     Status: None   Collection Time: 08/17/15 11:50 AM  Result Value Ref Range Status   Specimen Description URINE, CLEAN CATCH  Final   Special Requests NONE  Final   Culture 70,000 COLONIES/ml KLEBSIELLA PNEUMONIAE  Final   Report Status 08/19/2015 FINAL  Final   Organism ID, Bacteria KLEBSIELLA PNEUMONIAE  Final      Susceptibility   Klebsiella pneumoniae - MIC*    AMPICILLIN >=32 RESISTANT Resistant     CEFAZOLIN <=4 SENSITIVE Sensitive     CEFTRIAXONE <=1 SENSITIVE Sensitive     CIPROFLOXACIN <=0.25 SENSITIVE Sensitive     GENTAMICIN <=1 SENSITIVE Sensitive     IMIPENEM <=0.25 SENSITIVE Sensitive     NITROFURANTOIN 64 INTERMEDIATE Intermediate     TRIMETH/SULFA <=20 SENSITIVE Sensitive     AMPICILLIN/SULBACTAM 8 SENSITIVE Sensitive     PIP/TAZO 8 SENSITIVE Sensitive     * 70,000 COLONIES/ml KLEBSIELLA PNEUMONIAE  Culture, blood (routine x 2)     Status: None   Collection Time: 08/17/15 12:05 PM  Result Value Ref Range Status   Specimen Description BLOOD RIGHT ARM  Final   Special Requests BOTTLES DRAWN AEROBIC AND ANAEROBIC 5CCS  Final   Culture NO GROWTH 5 DAYS  Final   Report Status 08/22/2015 FINAL  Final  Culture, blood (routine x 2)     Status: None   Collection Time: 08/17/15 12:12 PM  Result Value Ref Range Status   Specimen Description BLOOD RIGHT HAND  Final   Special Requests BOTTLES DRAWN AEROBIC ONLY 3CCS  Final   Culture  Setup Time   Final    GRAM POSITIVE COCCI IN CLUSTERS AEROBIC BOTTLE ONLY CRITICAL RESULT CALLED TO, READ BACK BY AND VERIFIED WITH: J. NARAMBAS,RN AT [id number] ON [id number] BY  Rhea Bleacher    Culture   Final    STAPHYLOCOCCUS SPECIES (COAGULASE NEGATIVE) THE SIGNIFICANCE OF ISOLATING THIS ORGANISM FROM A SINGLE SET OF BLOOD CULTURES WHEN MULTIPLE SETS ARE DRAWN IS UNCERTAIN. PLEASE NOTIFY THE MICROBIOLOGY DEPARTMENT WITHIN ONE WEEK IF SPECIATION AND SENSITIVITIES ARE REQUIRED.    Report Status 08/19/2015 FINAL  Final  C difficile quick scan w PCR reflex     Status: None   Collection Time: 08/17/15  2:09 PM  Result Value Ref Range Status   C Diff antigen NEGATIVE NEGATIVE Final   C Diff toxin NEGATIVE NEGATIVE Final   C Diff interpretation Negative for toxigenic C. difficile  Final  Culture, blood (Routine X 2) w Reflex to ID Panel     Status: None   Collection Time: 08/18/15  3:37 PM  Result Value Ref Range Status   Specimen Description BLOOD RIGHT HAND  Final   Special Requests IN PEDIATRIC BOTTLE 1.5CC  Final   Culture NO GROWTH 5 DAYS  Final   Report Status 08/23/2015 FINAL  Final  Surgical PCR screen  Status: Abnormal   Collection Time: 08/21/15  8:21 PM  Result Value Ref Range Status   MRSA, PCR NEGATIVE NEGATIVE Final   Staphylococcus aureus POSITIVE (A) NEGATIVE Final    Comment:        The Xpert SA Assay (FDA approved for NASAL specimens in patients over 58 years of age), is one component of a comprehensive surveillance program.  Test performance has been validated by [facility] for patients greater than or equal to 44 year old. It is not intended to diagnose infection nor to guide or monitor treatment.      Labs: Basic Metabolic Panel:  Recent Labs Lab 08/20/15 0317 08/21/15 0358 08/22/15 0409 08/23/15 0630 08/24/15 0845  NA 138 137 138 139 142  K 3.9 4.6 4.4 4.1 3.9  CL 110 104 102 105 105  CO2 21* 25 28 27 29   GLUCOSE 105* 100* 98 84 99  BUN 14 14 14 12 11   CREATININE 1.52* 1.39* 1.49* 1.38* 1.54*  CALCIUM 7.5* 7.7* 7.7* 7.7* 7.9*  MG  --   --   --   --  1.9   Liver Function Tests:  Recent Labs Lab  08/20/15 0317 08/21/15 0358 08/22/15 0409 08/24/15 0845  AST 25 28 22 20   ALT 17 17 18  13*  ALKPHOS 64 88 94 103  BILITOT 0.3 0.4 0.3 0.2*  PROT 4.9* 5.6* 4.9* 5.5*  ALBUMIN 1.4* 1.5* 1.4* 1.5*   No results for input(s): LIPASE, AMYLASE in the last 168 hours. No results for input(s): AMMONIA in the last 168 hours. CBC:  Recent Labs Lab 08/20/15 0317 08/21/15 0358 08/22/15 0409 08/23/15 0630 08/24/15 0845  WBC 10.1 10.2 9.7 7.7 4.9  NEUTROABS  --  7.9*  --   --   --   HGB 8.1* 8.4* 7.6* 8.5* 8.1*  HCT 25.5* 26.1* 24.4* 26.1* 25.8*  MCV 90.7 89.7 89.4 89.7 90.2  PLT 392 454* 456* 502* 534*   Cardiac Enzymes: No results for input(s): CKTOTAL, CKMB, CKMBINDEX, TROPONINI in the last 168 hours. BNP: BNP (last 3 results)  Recent Labs  08/11/15 0315  BNP 80.1    ProBNP (last 3 results) No results for input(s): PROBNP in the last 8760 hours.  CBG:  Recent Labs Lab 08/20/15 0723 08/21/15 0745 08/22/15 0758 08/23/15 0757 08/24/15 0747  GLUCAP 97 128* 83 97 91       Signed:  Brizeyda Holtmeyer MD  FACP  [facility] 08/25/2015, 11:07 PM

## 2015-09-06 DIAGNOSIS — K819 Cholecystitis, unspecified: Secondary | ICD-10-CM | POA: Diagnosis not present

## 2015-09-06 DIAGNOSIS — N3090 Cystitis, unspecified without hematuria: Secondary | ICD-10-CM | POA: Diagnosis not present

## 2015-09-06 DIAGNOSIS — K311 Adult hypertrophic pyloric stenosis: Secondary | ICD-10-CM | POA: Diagnosis not present

## 2015-09-06 DIAGNOSIS — E039 Hypothyroidism, unspecified: Secondary | ICD-10-CM | POA: Diagnosis not present

## 2015-09-06 DIAGNOSIS — K439 Ventral hernia without obstruction or gangrene: Secondary | ICD-10-CM | POA: Diagnosis not present

## 2015-09-19 DIAGNOSIS — E7800 Pure hypercholesterolemia, unspecified: Secondary | ICD-10-CM | POA: Diagnosis not present

## 2015-09-19 DIAGNOSIS — K8020 Calculus of gallbladder without cholecystitis without obstruction: Secondary | ICD-10-CM | POA: Diagnosis not present

## 2015-09-19 DIAGNOSIS — D649 Anemia, unspecified: Secondary | ICD-10-CM | POA: Diagnosis not present

## 2015-09-19 DIAGNOSIS — K311 Adult hypertrophic pyloric stenosis: Secondary | ICD-10-CM | POA: Diagnosis not present

## 2015-09-19 DIAGNOSIS — K432 Incisional hernia without obstruction or gangrene: Secondary | ICD-10-CM | POA: Diagnosis not present

## 2015-10-14 DIAGNOSIS — L309 Dermatitis, unspecified: Secondary | ICD-10-CM | POA: Diagnosis not present

## 2015-10-14 DIAGNOSIS — R609 Edema, unspecified: Secondary | ICD-10-CM | POA: Diagnosis not present

## 2015-11-09 DIAGNOSIS — B354 Tinea corporis: Secondary | ICD-10-CM | POA: Diagnosis not present

## 2015-12-14 ENCOUNTER — Inpatient Hospital Stay (HOSPITAL_COMMUNITY)
Admission: EM | Admit: 2015-12-14 | Discharge: 2015-12-20 | DRG: 853 | Disposition: A | Discharge status: Home / Self Care | Payer: Medicare Other | Attending: Internal Medicine | Admitting: Internal Medicine

## 2015-12-14 ENCOUNTER — Emergency Department (HOSPITAL_COMMUNITY): Payer: Medicare Other

## 2015-12-14 ENCOUNTER — Encounter (HOSPITAL_COMMUNITY): Payer: Self-pay | Admitting: Emergency Medicine

## 2015-12-14 DIAGNOSIS — E872 Acidosis: Secondary | ICD-10-CM | POA: Diagnosis present

## 2015-12-14 DIAGNOSIS — G9340 Encephalopathy, unspecified: Secondary | ICD-10-CM | POA: Diagnosis not present

## 2015-12-14 DIAGNOSIS — G309 Alzheimer's disease, unspecified: Secondary | ICD-10-CM | POA: Diagnosis present

## 2015-12-14 DIAGNOSIS — Z8673 Personal history of transient ischemic attack (TIA), and cerebral infarction without residual deficits: Secondary | ICD-10-CM | POA: Diagnosis not present

## 2015-12-14 DIAGNOSIS — Z79899 Other long term (current) drug therapy: Secondary | ICD-10-CM

## 2015-12-14 DIAGNOSIS — D72829 Elevated white blood cell count, unspecified: Secondary | ICD-10-CM | POA: Diagnosis present

## 2015-12-14 DIAGNOSIS — N139 Obstructive and reflux uropathy, unspecified: Secondary | ICD-10-CM

## 2015-12-14 DIAGNOSIS — N320 Bladder-neck obstruction: Secondary | ICD-10-CM | POA: Diagnosis not present

## 2015-12-14 DIAGNOSIS — K8012 Calculus of gallbladder with acute and chronic cholecystitis without obstruction: Secondary | ICD-10-CM | POA: Diagnosis not present

## 2015-12-14 DIAGNOSIS — N1330 Unspecified hydronephrosis: Secondary | ICD-10-CM | POA: Diagnosis present

## 2015-12-14 DIAGNOSIS — K659 Peritonitis, unspecified: Secondary | ICD-10-CM

## 2015-12-14 DIAGNOSIS — R4182 Altered mental status, unspecified: Secondary | ICD-10-CM

## 2015-12-14 DIAGNOSIS — N182 Chronic kidney disease, stage 2 (mild): Secondary | ICD-10-CM

## 2015-12-14 DIAGNOSIS — F0280 Dementia in other diseases classified elsewhere without behavioral disturbance: Secondary | ICD-10-CM | POA: Diagnosis present

## 2015-12-14 DIAGNOSIS — G9341 Metabolic encephalopathy: Secondary | ICD-10-CM | POA: Diagnosis present

## 2015-12-14 DIAGNOSIS — K8000 Calculus of gallbladder with acute cholecystitis without obstruction: Secondary | ICD-10-CM | POA: Diagnosis present

## 2015-12-14 DIAGNOSIS — Z5331 Laparoscopic surgical procedure converted to open procedure: Secondary | ICD-10-CM

## 2015-12-14 DIAGNOSIS — Q909 Down syndrome, unspecified: Secondary | ICD-10-CM | POA: Diagnosis not present

## 2015-12-14 DIAGNOSIS — R402421 Glasgow coma scale score 9-12, in the field [EMT or ambulance]: Secondary | ICD-10-CM | POA: Diagnosis not present

## 2015-12-14 DIAGNOSIS — K8001 Calculus of gallbladder with acute cholecystitis with obstruction: Secondary | ICD-10-CM | POA: Diagnosis not present

## 2015-12-14 DIAGNOSIS — R509 Fever, unspecified: Secondary | ICD-10-CM | POA: Diagnosis not present

## 2015-12-14 DIAGNOSIS — R400 Somnolence: Secondary | ICD-10-CM

## 2015-12-14 DIAGNOSIS — R030 Elevated blood-pressure reading, without diagnosis of hypertension: Secondary | ICD-10-CM

## 2015-12-14 DIAGNOSIS — K810 Acute cholecystitis: Secondary | ICD-10-CM | POA: Diagnosis not present

## 2015-12-14 DIAGNOSIS — E876 Hypokalemia: Secondary | ICD-10-CM | POA: Diagnosis not present

## 2015-12-14 DIAGNOSIS — N183 Chronic kidney disease, stage 3 unspecified: Secondary | ICD-10-CM | POA: Diagnosis present

## 2015-12-14 DIAGNOSIS — Z880 Allergy status to penicillin: Secondary | ICD-10-CM

## 2015-12-14 DIAGNOSIS — Z66 Do not resuscitate: Secondary | ICD-10-CM | POA: Diagnosis present

## 2015-12-14 DIAGNOSIS — E039 Hypothyroidism, unspecified: Secondary | ICD-10-CM | POA: Diagnosis present

## 2015-12-14 DIAGNOSIS — K8066 Calculus of gallbladder and bile duct with acute and chronic cholecystitis without obstruction: Secondary | ICD-10-CM | POA: Diagnosis not present

## 2015-12-14 DIAGNOSIS — A419 Sepsis, unspecified organism: Secondary | ICD-10-CM | POA: Diagnosis not present

## 2015-12-14 DIAGNOSIS — E038 Other specified hypothyroidism: Secondary | ICD-10-CM | POA: Diagnosis not present

## 2015-12-14 DIAGNOSIS — K279 Peptic ulcer, site unspecified, unspecified as acute or chronic, without hemorrhage or perforation: Secondary | ICD-10-CM | POA: Diagnosis present

## 2015-12-14 DIAGNOSIS — K311 Adult hypertrophic pyloric stenosis: Secondary | ICD-10-CM | POA: Diagnosis present

## 2015-12-14 DIAGNOSIS — K828 Other specified diseases of gallbladder: Secondary | ICD-10-CM | POA: Diagnosis not present

## 2015-12-14 DIAGNOSIS — K819 Cholecystitis, unspecified: Secondary | ICD-10-CM

## 2015-12-14 DIAGNOSIS — R1011 Right upper quadrant pain: Secondary | ICD-10-CM

## 2015-12-14 LAB — COMPREHENSIVE METABOLIC PANEL
ALT: 10 U/L — ABNORMAL LOW (ref 17–63)
AST: 27 U/L (ref 15–41)
Albumin: 2.7 g/dL — ABNORMAL LOW (ref 3.5–5.0)
Alkaline Phosphatase: 61 U/L (ref 38–126)
Anion gap: 14 (ref 5–15)
BUN: 14 mg/dL (ref 6–20)
CO2: 25 mmol/L (ref 22–32)
Calcium: 8.7 mg/dL — ABNORMAL LOW (ref 8.9–10.3)
Chloride: 98 mmol/L — ABNORMAL LOW (ref 101–111)
Creatinine, Ser: 1.43 mg/dL — ABNORMAL HIGH (ref 0.61–1.24)
GFR calc Af Amer: 59 mL/min — ABNORMAL LOW (ref >60)
GFR calc non Af Amer: 51 mL/min — ABNORMAL LOW (ref >60)
Glucose, Bld: 178 mg/dL — ABNORMAL HIGH (ref 65–99)
Potassium: 3.5 mmol/L (ref 3.5–5.1)
Sodium: 137 mmol/L (ref 135–145)
Total Bilirubin: 0.6 mg/dL (ref 0.3–1.2)
Total Protein: 6.6 g/dL (ref 6.5–8.1)

## 2015-12-14 LAB — URINALYSIS, ROUTINE W REFLEX MICROSCOPIC
Bilirubin Urine: NEGATIVE
Glucose, UA: NEGATIVE mg/dL
Ketones, ur: NEGATIVE mg/dL
Leukocytes, UA: NEGATIVE
Nitrite: NEGATIVE
Protein, ur: >300 mg/dL — AB
Specific Gravity, Urine: 1.017 (ref 1.005–1.030)
pH: 6.5 (ref 5.0–8.0)

## 2015-12-14 LAB — URINE MICROSCOPIC-ADD ON
Squamous Epithelial / LPF: NONE SEEN
WBC, UA: NONE SEEN WBC/hpf (ref 0–5)

## 2015-12-14 LAB — PROTIME-INR
INR: 1.11 (ref 0.00–1.49)
Prothrombin Time: 14.5 seconds (ref 11.6–15.2)

## 2015-12-14 LAB — CBC WITH DIFFERENTIAL/PLATELET
Basophils Absolute: 0 10*3/uL (ref 0.0–0.1)
Basophils Relative: 0 %
Eosinophils Absolute: 0 10*3/uL (ref 0.0–0.7)
Eosinophils Relative: 0 %
HCT: 34 % — ABNORMAL LOW (ref 39.0–52.0)
Hemoglobin: 11.3 g/dL — ABNORMAL LOW (ref 13.0–17.0)
Lymphocytes Relative: 6 %
Lymphs Abs: 0.8 10*3/uL (ref 0.7–4.0)
MCH: 27.4 pg (ref 26.0–34.0)
MCHC: 33.2 g/dL (ref 30.0–36.0)
MCV: 82.5 fL (ref 78.0–100.0)
Monocytes Absolute: 1.4 10*3/uL — ABNORMAL HIGH (ref 0.1–1.0)
Monocytes Relative: 10 %
Neutro Abs: 11.5 10*3/uL — ABNORMAL HIGH (ref 1.7–7.7)
Neutrophils Relative %: 84 %
Platelets: 270 10*3/uL (ref 150–400)
RBC: 4.12 MIL/uL — ABNORMAL LOW (ref 4.22–5.81)
RDW: 16.8 % — ABNORMAL HIGH (ref 11.5–15.5)
WBC: 13.8 10*3/uL — ABNORMAL HIGH (ref 4.0–10.5)

## 2015-12-14 LAB — I-STAT CG4 LACTIC ACID, ED: Lactic Acid, Venous: 4.83 mmol/L (ref 0.5–2.0)

## 2015-12-14 LAB — LACTIC ACID, PLASMA: LACTIC ACID, VENOUS: 1.8 mmol/L (ref 0.5–2.0)

## 2015-12-14 LAB — PROCALCITONIN

## 2015-12-14 LAB — INFLUENZA PANEL BY PCR (TYPE A & B)
H1N1 flu by pcr: NOT DETECTED
INFLAPCR: NEGATIVE
Influenza B By PCR: NEGATIVE

## 2015-12-14 LAB — APTT: aPTT: 26 seconds (ref 24–37)

## 2015-12-14 LAB — AMYLASE: Amylase: 35 U/L (ref 28–100)

## 2015-12-14 LAB — LIPASE, BLOOD: Lipase: 28 U/L (ref 11–51)

## 2015-12-14 LAB — LACTATE DEHYDROGENASE: LDH: 226 U/L — ABNORMAL HIGH (ref 98–192)

## 2015-12-14 MED ORDER — ONDANSETRON HCL 4 MG PO TABS: 4.0000 mg | ORAL_TABLET | Freq: Four times a day (QID) | ORAL | Status: DC | PRN

## 2015-12-14 MED ORDER — FENTANYL CITRATE (PF) 100 MCG/2ML IJ SOLN
12.5000 ug | INTRAMUSCULAR | Status: DC | PRN
  Administered 2015-12-14 – 2015-12-16 (×2): 12.5 ug via INTRAVENOUS
  Filled 2015-12-14 (×2): qty 2

## 2015-12-14 MED ORDER — LEVOFLOXACIN IN D5W 750 MG/150ML IV SOLN: 750.0000 mg | INTRAVENOUS | Status: DC

## 2015-12-14 MED ORDER — LEVOTHYROXINE SODIUM 100 MCG IV SOLR
75.0000 ug | Freq: Every day | INTRAVENOUS | Status: DC
  Administered 2015-12-15 – 2015-12-19 (×5): 75 ug via INTRAVENOUS
  Filled 2015-12-14 (×5): qty 5

## 2015-12-14 MED ORDER — SODIUM CHLORIDE 0.9 % IV SOLN
INTRAVENOUS | Status: AC
  Administered 2015-12-14 – 2015-12-15 (×2): via INTRAVENOUS

## 2015-12-14 MED ORDER — DEXTROSE 5 % IV SOLN
1.0000 g | Freq: Three times a day (TID) | INTRAVENOUS | Status: DC
  Filled 2015-12-14 (×2): qty 1

## 2015-12-14 MED ORDER — VANCOMYCIN HCL IN DEXTROSE 1-5 GM/200ML-% IV SOLN
1000.0000 mg | Freq: Once | INTRAVENOUS | Status: AC
  Administered 2015-12-14: 1000 mg via INTRAVENOUS
  Filled 2015-12-14: qty 200

## 2015-12-14 MED ORDER — SODIUM CHLORIDE 0.9% FLUSH
3.0000 mL | Freq: Two times a day (BID) | INTRAVENOUS | Status: DC
  Administered 2015-12-14 – 2015-12-19 (×9): 3 mL via INTRAVENOUS

## 2015-12-14 MED ORDER — MORPHINE SULFATE (PF) 4 MG/ML IV SOLN
4.0000 mg | Freq: Once | INTRAVENOUS | Status: AC
  Administered 2015-12-14: 4 mg via INTRAVENOUS
  Filled 2015-12-14: qty 1

## 2015-12-14 MED ORDER — LEVOTHYROXINE SODIUM 75 MCG PO TABS: 150.0000 ug | ORAL_TABLET | Freq: Every day | ORAL | Status: DC

## 2015-12-14 MED ORDER — PANTOPRAZOLE SODIUM 40 MG PO PACK: 40.0000 mg | PACK | Freq: Two times a day (BID) | ORAL | Status: DC

## 2015-12-14 MED ORDER — VANCOMYCIN HCL IN DEXTROSE 750-5 MG/150ML-% IV SOLN: 750.0000 mg | INTRAVENOUS | Status: DC

## 2015-12-14 MED ORDER — TAMSULOSIN HCL 0.4 MG PO CAPS
0.4000 mg | ORAL_CAPSULE | Freq: Every day | ORAL | Status: DC
  Administered 2015-12-17 – 2015-12-20 (×4): 0.4 mg via ORAL
  Filled 2015-12-14 (×5): qty 1

## 2015-12-14 MED ORDER — PANTOPRAZOLE SODIUM 40 MG IV SOLR
40.0000 mg | Freq: Two times a day (BID) | INTRAVENOUS | Status: DC
  Administered 2015-12-15 (×3): 40 mg via INTRAVENOUS
  Filled 2015-12-14 (×3): qty 40

## 2015-12-14 MED ORDER — LEVOFLOXACIN IN D5W 750 MG/150ML IV SOLN
750.0000 mg | Freq: Once | INTRAVENOUS | Status: DC
  Administered 2015-12-14: 750 mg via INTRAVENOUS
  Filled 2015-12-14: qty 150

## 2015-12-14 MED ORDER — DEXTROSE 5 % IV SOLN
2.0000 g | Freq: Once | INTRAVENOUS | Status: AC
  Administered 2015-12-14: 2 g via INTRAVENOUS
  Filled 2015-12-14: qty 2

## 2015-12-14 MED ORDER — SUCRALFATE 1 GM/10ML PO SUSP
1.0000 g | Freq: Four times a day (QID) | ORAL | Status: DC
  Administered 2015-12-15 – 2015-12-20 (×13): 1 g via ORAL
  Filled 2015-12-14 (×16): qty 10

## 2015-12-14 MED ORDER — DIATRIZOATE MEGLUMINE & SODIUM 66-10 % PO SOLN
ORAL | Status: AC
  Filled 2015-12-14: qty 30

## 2015-12-14 MED ORDER — ENOXAPARIN SODIUM 40 MG/0.4ML ~~LOC~~ SOLN
40.0000 mg | SUBCUTANEOUS | Status: DC
  Administered 2015-12-15 (×2): 40 mg via SUBCUTANEOUS
  Filled 2015-12-14 (×2): qty 0.4

## 2015-12-14 MED ORDER — METRONIDAZOLE IN NACL 5-0.79 MG/ML-% IV SOLN
500.0000 mg | Freq: Three times a day (TID) | INTRAVENOUS | Status: DC
  Administered 2015-12-14 – 2015-12-20 (×18): 500 mg via INTRAVENOUS
  Filled 2015-12-14 (×18): qty 100

## 2015-12-14 MED ORDER — ONDANSETRON HCL 4 MG/2ML IJ SOLN: 4.0000 mg | Freq: Four times a day (QID) | INTRAMUSCULAR | Status: DC | PRN

## 2015-12-14 MED ORDER — MORPHINE SULFATE (PF) 2 MG/ML IV SOLN
2.0000 mg | INTRAVENOUS | Status: DC | PRN
  Administered 2015-12-14 – 2015-12-15 (×2): 2 mg via INTRAVENOUS
  Filled 2015-12-14 (×2): qty 1

## 2015-12-14 MED ORDER — SODIUM CHLORIDE 0.9 % IV BOLUS (SEPSIS)
500.0000 mL | INTRAVENOUS | Status: AC
  Administered 2015-12-14: 500 mL via INTRAVENOUS

## 2015-12-14 MED ORDER — LORAZEPAM 2 MG/ML IJ SOLN
1.0000 mg | Freq: Once | INTRAMUSCULAR | Status: AC
  Administered 2015-12-14: 1 mg via INTRAVENOUS
  Filled 2015-12-14: qty 1

## 2015-12-14 MED ORDER — SODIUM CHLORIDE 0.9 % IV BOLUS (SEPSIS)
1000.0000 mL | Freq: Once | INTRAVENOUS | Status: AC
  Administered 2015-12-14: 1000 mL via INTRAVENOUS

## 2015-12-14 MED ORDER — CIPROFLOXACIN IN D5W 400 MG/200ML IV SOLN
400.0000 mg | Freq: Two times a day (BID) | INTRAVENOUS | Status: DC
  Administered 2015-12-15 – 2015-12-16 (×2): 400 mg via INTRAVENOUS
  Filled 2015-12-14 (×2): qty 200

## 2015-12-14 MED ORDER — ACETAMINOPHEN 650 MG RE SUPP
650.0000 mg | Freq: Four times a day (QID) | RECTAL | Status: DC | PRN
  Administered 2015-12-17: 650 mg via RECTAL
  Filled 2015-12-14: qty 1

## 2015-12-14 MED ORDER — ACETAMINOPHEN 325 MG PO TABS: 650.0000 mg | ORAL_TABLET | Freq: Four times a day (QID) | ORAL | Status: DC | PRN

## 2015-12-14 NOTE — ED Provider Notes (Signed)
I received pt in signout from [employer]. The patient presented with altered mental status and we were awaiting completion of his CT scan as well as a repeat lactate as his initial was concerning at 4.8. Repeat lactate after IV fluids was normal. Critical care did evaluate the patient and given reassuring repeat lactate, recommended medicine admission. CT showed severe bladder distention with bilateral hydroureteronephrosis and cholelithiasis with possible findings of acute cholecystitis. Ordered a Foley catheter for bladder decompression. Gave the patient dose of morphine. I consulted general surgery, Dr. Hulen Skains, who recommended medicine admission for HIDA scan. I discussed with Dr. Roel Cluck, who admitted the patient for further treatment.  Sharlett Iles, MD 12/14/15 [phone]

## 2015-12-14 NOTE — Progress Notes (Signed)
Pharmacy Antibiotic Note  Isaac Ramirez is a 65 y.o. male admitted on 12/14/2015 with intra-abdominal infection.  Pharmacy has been consulted for cipro dosing. LA 4.8, SCr 1.43, eCrCl 35-40 ml/min.  Plan: Stop vanc, levaquin and aztreonam Start cipro 400mg  IV q12h Monitor culture data, renal function and clinical course   Height: 5' (152.4 cm) Weight: 108 lb (48.988 kg) IBW/kg (Calculated) : 50  Temp (24hrs), Avg:98.7 F (37.1 C), Min:98.2 F (36.8 C), Max:99.1 F (37.3 C)   Recent Labs Lab 12/14/15 1224 12/14/15 1247 12/14/15 1611  WBC 13.8*  --   --   CREATININE 1.43*  --   --   LATICACIDVEN  --  4.83* 1.8    Estimated Creatinine Clearance: 36.6 mL/min (by C-G formula based on Cr of 1.43).    Allergies  Allergen Reactions  . Penicillins Anaphylaxis    Has patient had a PCN reaction causing immediate rash, facial/tongue/throat swelling, SOB or lightheadedness with hypotension: Yes Has patient had a PCN reaction causing severe rash involving mucus membranes or skin necrosis: No Has patient had a PCN reaction that required hospitalization No Has patient had a PCN reaction occurring within the last 10 years: No If all of the above answers are "NO", then may proceed with Cephalosporin use.  . Sulfa Antibiotics     Antimicrobials this admission: 4/12 vancomycin  4/12 levaquin  4/12 aztreonam 4/12 Cipro >>  Dose adjustments this admission: NA  Microbiology results: 4/12 BCx: sent 4/12 UCx: sent  Andrey Cota. Diona Foley, PharmD, [place] Clinical Pharmacist Pager [phone] 12/14/2015 10:16 PM

## 2015-12-14 NOTE — Progress Notes (Addendum)
Has a large stone on Ct of his abdomen with possible pericholecystic fluid.  Apparently not tender in the RUQ.  He needs a HIDA scan.  It seems from the CT and his history that he has had a partial gastrectomy with a Roux-en-Y reconstruction.  We know this patient from a previous hospitalization for recurrence ulcer disease with some upper abdominal pain.  Gall stone is well known about.  Kathryne Eriksson. Dahlia Bailiff, MD, [place] [phone] Surgery  Addendum:  The gallbladder is not the reason for his lactic acidosis.

## 2015-12-14 NOTE — ED Provider Notes (Signed)
CSN: [id number]     Arrival date & time 12/14/15  1159 History   First MD Initiated Contact with Patient 12/14/15 1214     Chief Complaint  Patient presents with  . Altered Mental Status    HPI The patient is a 65 year old white male with Down syndrome who presented with EMS due to unresponsiveness. Family provided the history and states that he was not acting like himself yesterday, anorexia, decreased fluid intake and sleeping, somewhat lethargic. Today upon waking the family noticed he was unresponsive and when he spoke slightly it was not relevant to conversation. Family denies nausea, vomiting, known fevers, night sweats.    Past Medical History  Diagnosis Date  . PUD (peptic ulcer disease)   . Hypothyroidism   . Trisomy 21    Past Surgical History  Procedure Laterality Date  . Roux-en-y procedure  2007  . Vagotomy    . Esophagogastroduodenoscopy N/A 08/11/2015    Procedure: ESOPHAGOGASTRODUODENOSCOPY (EGD);  Surgeon: Clarene Essex, MD;  Location: [facility] ENDOSCOPY;  Service: Endoscopy;  Laterality: N/A;   Family History  Problem Relation Age of Onset  . Family history unknown: Yes   Social History  Substance Use Topics  . Smoking status: Never Smoker   . Smokeless tobacco: None  . Alcohol Use: No    Review of Systems Level V caveat applies due to altered mental status   Allergies  Penicillins and Sulfa antibiotics  Home Medications   Prior to Admission medications   Medication Sig Start Date End Date Taking? Authorizing Provider  ciprofloxacin (CIPRO) 500 MG tablet Take 1 tablet (500 mg total) by mouth 2 (two) times daily. 08/24/15   Hosie Poisson, MD  feeding supplement, ENSURE ENLIVE, (ENSURE ENLIVE) LIQD Take 237 mLs by mouth 2 (two) times daily between meals. 08/24/15   Hosie Poisson, MD  levothyroxine (SYNTHROID, LEVOTHROID) 150 MCG tablet Take 150 mcg by mouth daily before breakfast. 07/31/15   Historical Provider, MD  metroNIDAZOLE (FLAGYL) 500 MG tablet Take 1  tablet (500 mg total) by mouth every 8 (eight) hours. 08/24/15   Hosie Poisson, MD  pantoprazole sodium (PROTONIX) 40 mg/20 mL PACK Take 20 mLs (40 mg total) by mouth 2 (two) times daily. 08/24/15   Hosie Poisson, MD  sucralfate (CARAFATE) 1 GM/10ML suspension Take 10 mLs (1 g total) by mouth 4 (four) times daily. 08/24/15   Hosie Poisson, MD   BP 151/91 mmHg  Pulse 102  Temp(Src) 98.7 F (37.1 C) (Rectal)  Resp 15  Ht 5' (1.524 m)  Wt 48.988 kg  BMI 21.09 kg/m2  SpO2 98% Physical Exam  Constitutional: He appears well-developed and well-nourished. He appears lethargic.  HENT:  Head: Normocephalic and atraumatic.  Mouth/Throat: Oropharynx is clear and moist.  Eyes: Pupils are equal, round, and reactive to light.  Pin point pupils, not reactive to light  Neck: Normal range of motion. Neck supple.  Cardiovascular: Normal rate, regular rhythm, normal heart sounds and intact distal pulses.  Exam reveals no gallop and no friction rub.   No murmur heard. Pulmonary/Chest: Effort normal and breath sounds normal.  Abdominal: Soft. Bowel sounds are normal. He exhibits no distension. There is no rebound and no guarding.  Neurological: He appears lethargic. He displays no tremor. He exhibits abnormal muscle tone. He displays no seizure activity. GCS eye subscore is 3. GCS verbal subscore is 1. GCS motor subscore is 3.  DTR hyperreflexic in all upper and lower extremities bilaterally.  Skin: Skin is warm and  dry. No rash noted. No erythema.  Psychiatric: He has a normal mood and affect. His behavior is normal.  Nursing note and vitals reviewed.   ED Course  Procedures (including critical care time) Labs Review Labs Reviewed  COMPREHENSIVE METABOLIC PANEL - Abnormal; Notable for the following:    Chloride 98 (*)    Glucose, Bld 178 (*)    Creatinine, Ser 1.43 (*)    Calcium 8.7 (*)    Albumin 2.7 (*)    ALT 10 (*)    GFR calc non Af Amer 51 (*)    GFR calc Af Amer 59 (*)    All other  components within normal limits  CBC WITH DIFFERENTIAL/PLATELET - Abnormal; Notable for the following:    WBC 13.8 (*)    RBC 4.12 (*)    Hemoglobin 11.3 (*)    HCT 34.0 (*)    RDW 16.8 (*)    Neutro Abs 11.5 (*)    Monocytes Absolute 1.4 (*)    All other components within normal limits  URINALYSIS, ROUTINE W REFLEX MICROSCOPIC (NOT AT [facility]) - Abnormal; Notable for the following:    Hgb urine dipstick MODERATE (*)    Protein, ur >300 (*)    All other components within normal limits  URINE MICROSCOPIC-ADD ON - Abnormal; Notable for the following:    Bacteria, UA RARE (*)    All other components within normal limits  I-STAT CG4 LACTIC ACID, ED - Abnormal; Notable for the following:    Lactic Acid, Venous 4.83 (*)    All other components within normal limits  CULTURE, BLOOD (ROUTINE X 2)  CULTURE, BLOOD (ROUTINE X 2)  URINE CULTURE  APTT  PROTIME-INR  INFLUENZA PANEL BY PCR (TYPE A & B, H1N1)  LACTIC ACID, PLASMA  PROCALCITONIN  AMYLASE  LIPASE, BLOOD  LACTATE DEHYDROGENASE    Imaging Review Ct Head Wo Contrast  12/14/2015  CLINICAL DATA:  Altered mental status. EXAM: CT HEAD WITHOUT CONTRAST TECHNIQUE: Contiguous axial images were obtained from the base of the skull through the vertex without intravenous contrast. COMPARISON:  None. FINDINGS: Bony calvarium appears intact. Minimal diffuse cortical atrophy is noted. Mild chronic ischemic white matter disease is noted. Focal low density is seen involving superior portion of right parietal cortex concerning for infarction of indeterminate age. No mass effect midline shift is noted. Ventricular size is within normal limits. There is no evidence of mass lesion or hemorrhage. IMPRESSION: Minimal diffuse cortical atrophy. Mild chronic ischemic white matter disease. Focal low density seen in the superior portion of the right parietal cortex concerning for infarction of indeterminate age. MRI is recommended for further evaluation.  Electronically Signed   By: Marijo Conception, M.D.   On: 12/14/2015 14:18   Dg Chest Port 1 View  12/14/2015  CLINICAL DATA:  Altered mental status EXAM: PORTABLE CHEST 1 VIEW COMPARISON:  08/17/2015 FINDINGS: Heart and mediastinal contours are within normal limits. No focal opacities or effusions. No acute bony abnormality. Degenerative changes in the shoulders and thoracic spine. IMPRESSION: No active cardiopulmonary disease. Electronically Signed   By: Rolm Baptise M.D.   On: 12/14/2015 13:37   I have personally reviewed and evaluated these images and lab results as part of my medical decision-making.   EKG Interpretation None      The patient will need admission to the hospital for altered mental status.  The patient was evaluated by critical care medicine, who will follow the patient and make decisions home.  Completion of all of his labs for admission    Dalia Heading, PA-C 12/14/15 [place], MD 12/14/15 [phone]

## 2015-12-14 NOTE — Consult Note (Signed)
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  In ER was found to have lactate 4.8, mild abd pain, AMS and PCCM consulted for sepsis.    PAST MEDICAL HISTORY :  He  has a past medical history of PUD (peptic ulcer disease); Hypothyroidism; and Trisomy 21.  PAST SURGICAL HISTORY: He  has past surgical history that includes Roux-en-y procedure (2007); Vagotomy; and Esophagogastroduodenoscopy (N/A, 08/11/2015).  Allergies  Allergen Reactions  . Penicillins Anaphylaxis    Has patient had a PCN reaction causing immediate rash, facial/tongue/throat swelling, SOB or lightheadedness with hypotension: Yes Has patient had a PCN reaction causing severe rash involving mucus membranes or skin necrosis: No Has patient had a PCN reaction that required hospitalization No Has patient had a PCN reaction occurring within the last 10 years: No If all of the above answers are "NO", then may proceed with Cephalosporin use.  . Sulfa Antibiotics     No current facility-administered medications on file prior to encounter.   Current Outpatient Prescriptions on File Prior to Encounter  Medication Sig  . ciprofloxacin (CIPRO) 500 MG tablet Take 1 tablet (500 mg total) by mouth 2 (two) times daily.  . feeding supplement, ENSURE ENLIVE, (ENSURE ENLIVE) LIQD Take 237 mLs by mouth 2 (two) times daily between meals.  Marland Kitchen levothyroxine (SYNTHROID, LEVOTHROID) 150 MCG tablet Take 150 mcg by mouth daily before breakfast.  . metroNIDAZOLE (FLAGYL) 500 MG tablet Take 1 tablet (500 mg total) by mouth every 8 (eight) hours.  .  pantoprazole sodium (PROTONIX) 40 mg/20 mL PACK Take 20 mLs (40 mg total) by mouth 2 (two) times daily.  . sucralfate (CARAFATE) 1 GM/10ML suspension Take 10 mLs (1 g total) by mouth 4 (four) times daily.    FAMILY HISTORY:  His has no family status information on file.   SOCIAL HISTORY: He  reports that he has never smoked. He does not have any smokeless tobacco history on file. He reports that he does not drink alcohol.  REVIEW OF SYSTEMS:   Unable r/t lethargy.  Per family at bedside.  Mild abd pain, lethargy, felt warm to touch.  No reports of vomiting, cough, dyspnea, hemoptysis, diarrhea.   SUBJECTIVE:    VITAL SIGNS: BP 164/96 mmHg  Pulse 103  Temp(Src) 98.7 F (37.1 C) (Rectal)  Resp 17  Ht 5' (1.524 m)  Wt 48.988 kg (108 lb)  BMI 21.09 kg/m2  SpO2 97%  HEMODYNAMICS:    VENTILATOR SETTINGS:    INTAKE / OUTPUT:    PHYSICAL EXAMINATION: General:  NAD  Neuro:  Awake, says "i have pain" HEENT:  Mm moist, no JVD  Cardiovascular:  s1s2 rrr Lungs:  resps even non labored on [place], diminished bases  Abdomen:  RUQ tenderness, guarding, hypoactive bs, reducible central ventral hernia  Musculoskeletal:  Warm and dry, no edema  LABS:  BMET  Recent Labs Lab 12/14/15 1224  NA 137  K 3.5  CL 98*  CO2 25  BUN 14  CREATININE 1.43*  GLUCOSE 178*    Electrolytes  Recent Labs Lab 12/14/15 1224  CALCIUM 8.7*  CBC  Recent Labs Lab 12/14/15 1224  WBC 13.8*  HGB 11.3*  HCT 34.0*  PLT 270    Coag's  Recent Labs Lab 12/14/15 1224  APTT 26  INR 1.11    Sepsis Markers  Recent Labs Lab 12/14/15 1247  LATICACIDVEN 4.83*    ABG No results for input(s): PHART, PCO2ART, PO2ART in the last 168 hours.  Liver Enzymes  Recent Labs Lab 12/14/15 1224  AST 27  ALT 10*  ALKPHOS 61  BILITOT 0.6  ALBUMIN 2.7*    Cardiac Enzymes No results for input(s): TROPONINI, PROBNP in the last 168 hours.  Glucose No results for input(s): GLUCAP in  the last 168 hours.  Imaging Ct Head Wo Contrast  12/14/2015  CLINICAL DATA:  Altered mental status. EXAM: CT HEAD WITHOUT CONTRAST TECHNIQUE: Contiguous axial images were obtained from the base of the skull through the vertex without intravenous contrast. COMPARISON:  None. FINDINGS: Bony calvarium appears intact. Minimal diffuse cortical atrophy is noted. Mild chronic ischemic white matter disease is noted. Focal low density is seen involving superior portion of right parietal cortex concerning for infarction of indeterminate age. No mass effect midline shift is noted. Ventricular size is within normal limits. There is no evidence of mass lesion or hemorrhage. IMPRESSION: Minimal diffuse cortical atrophy. Mild chronic ischemic white matter disease. Focal low density seen in the superior portion of the right parietal cortex concerning for infarction of indeterminate age. MRI is recommended for further evaluation. Electronically Signed   By: Marijo Conception, M.D.   On: 12/14/2015 14:18   Dg Chest Port 1 View  12/14/2015  CLINICAL DATA:  Altered mental status EXAM: PORTABLE CHEST 1 VIEW COMPARISON:  08/17/2015 FINDINGS: Heart and mediastinal contours are within normal limits. No focal opacities or effusions. No acute bony abnormality. Degenerative changes in the shoulders and thoracic spine. IMPRESSION: No active cardiopulmonary disease. Electronically Signed   By: Rolm Baptise M.D.   On: 12/14/2015 13:37     STUDIES:  CT head 4/12>>> CT abd/pelvis 4/12>>>  CULTURES: BC x 2 4/12>>> Urine 4/12>>>  ANTIBIOTICS: Vanc 4/12>>> levaquin 4/12>>> Flagyl 4/12>>>  SIGNIFICANT EVENTS:   LINES/TUBES:   DISCUSSION: 65yo male with hx trisomy 21, recent admit for gastric outlet obstruction admitted 4/12 with sepsis of unclear etiology, suspect abd source.     ASSESSMENT / PLAN:  Sepsis  PLAN -  Broad spectrum abx as above  CT abd/pelvis -- no contrast for now as pt unable to drink contrast,  will avoid NG for now  Volume resuscitation  Repeat lactate  Check pct  F/u cultures  [facility] for SDU admit per [facility]  Consider GI input  Amylase, lipase, LDH    Nickolas Madrid, NP 12/14/2015  2:27 PM Pager: [phone] or [phone]   STAFF NOTE: I, Merrie Roof, MD FACP have personally reviewed patient's available data, including medical history, events of note, physical examination and test results as part of my evaluation. I have discussed with resident/NP and other care providers such as pharmacist, RN and RRT. In addition, I personally evaluated patient and elicited key findings of: awake, agitation, NO nuchal rigidity, does have guarding RUQ tenderness, murphy sign, tough to tell as pt cant describe location of pain, had recent stone cystic duct, Lactic noted, code sepsis called, needs 1.470 fluid, will add additional liter STAT wide open to meet, lactic repeat after , needs CT abdo/pelvis ASAP  And GI assessment likely, assess amy, lip, ldh, use flagyl, vanc, levofloxacin  with severe pcn allergy history, need ecg still, ct head done, will assess results, unlikley to be positive source, no LP at this stage needed, lactic may help triage for admission, BP is good for now, fent low dose for pain I updated mom in full in room, no role line at this stage pending results alctic after volume  Lavon Paganini. Titus Mould, MD, [place] Pgr: [facility] 12/14/2015 3:09 PM

## 2015-12-14 NOTE — H&P (Addendum)
PCP:  Donnie Coffin, MD  [facility]  Pulmonology [place]  Referring provider Dr. Rex Kras   Chief Complaint: confusion    HPI: Isaac Ramirez is a 65 y.o. male   has a past medical history of PUD (peptic ulcer disease); Hypothyroidism; and Trisomy 21.   Presented with lethargy and altered mental status he haven't been eating and drinking well, family did not endorse any nausea vomiting no fevers or chills. Patient was so confused he was not endorsing any pain. Family felt that he was unresponsive. He ws reporting abdominal pain no fever.  In ER:  Initially noted to have elevated lactic acid after 4.8 [facility] M was consulted recommended rehydration he was given 0.5 L of normal saline mental status is improved lactic acid on repeat went down to 1.8. She was tested for influenza was found to be negative. Pro-calcitonin undetectable no evidence of urinary tract infection. CT scan of the abdomen was done showing mild bilateral hydroureteronephrosis secondary to severe urinary bladder distention as well as cholelithiasis with mild gallbladder wall thickening and possible pericholecystic fluid. He was noted to have white blood cell count of 13 LFTs nonelevated. 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 EF provider has discussed case with general surgery who see patient in consult. 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  Patient was  admitted in December for gastric ulcer with obstruction and blood loss anemia his hemoglobin down to 4 . At that time CT scan of abdomen did show possible acute cholecystitis no dominant stone within the gallbladder neck at 1.9 cm he was treated with IV Flagyl and ciprofloxacin he has been seen by general surgery. During that admission admission but thought to be a symptomatic from his gallbladder standpoint the plan of medical management only. 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 Also questionable cholecystitis with prior history of similar findings in December  Review of Systems:    Pertinent positives include:  fatigue, confusion  Constitutional:  No weight loss, night sweats, Fevers, chills,weight loss  HEENT:  No headaches, Difficulty swallowing,Tooth/dental problems,Sore throat,  No sneezing, itching, ear ache, nasal congestion, post nasal drip,  Cardio-vascular:  No chest pain, Orthopnea, PND, anasarca, dizziness, palpitations.no Bilateral lower extremity swelling  GI:  No heartburn, indigestion, abdominal pain, nausea, vomiting, diarrhea, change in bowel habits, loss of appetite, melena, blood in stool, hematemesis Resp:  no shortness of breath at rest. No dyspnea on exertion, No excess mucus, no productive cough, No non-productive cough, No coughing up of blood.No change in color of mucus.No wheezing. Skin:  no rash or lesions. No jaundice GU:  no dysuria, change in color of urine, no urgency or frequency. No straining to urinate.  No flank pain.  Musculoskeletal:  No joint pain or  no joint swelling. No decreased range of motion. No back pain.  Psych:  No change in mood or affect. No depression or anxiety. No memory loss.  Neuro: no localizing neurological complaints, no tingling, no weakness, no double vision, no  gait abnormality, no slurred speech,   Otherwise ROS are negative except for above, 10 systems were reviewed  Past Medical History: Past Medical History  Diagnosis Date  . PUD (peptic ulcer disease)   . Hypothyroidism   . Trisomy 21    Past Surgical History  Procedure Laterality Date  . Roux-en-y procedure  2007  . Vagotomy    . Esophagogastroduodenoscopy N/A 08/11/2015    Procedure: ESOPHAGOGASTRODUODENOSCOPY (EGD);  Surgeon: Clarene Essex, MD;  Location: [facility] ENDOSCOPY;  Service: Endoscopy;  Laterality: N/A;     Medications: Prior to Admission medications   Medication Sig Start Date End Date Taking? Authorizing Provider  ciprofloxacin (CIPRO) 500 MG tablet Take 1 tablet (500 mg total) by mouth 2 (two) times daily. 08/24/15   Hosie Poisson, MD  feeding supplement, ENSURE ENLIVE, (ENSURE ENLIVE) LIQD Take 237 mLs by mouth 2 (two) times daily between meals. 08/24/15   Hosie Poisson, MD  levothyroxine (SYNTHROID, LEVOTHROID) 150 MCG tablet Take 150 mcg by mouth daily before breakfast. 07/31/15   Historical Provider, MD  metroNIDAZOLE (FLAGYL) 500 MG tablet Take 1 tablet (500 mg total) by mouth every 8 (eight) hours. 08/24/15   Hosie Poisson, MD  pantoprazole sodium (PROTONIX) 40 mg/20 mL PACK Take 20 mLs (40 mg total) by mouth 2 (two) times daily. 08/24/15   Hosie Poisson, MD  sucralfate (CARAFATE) 1 GM/10ML suspension Take 10 mLs (1 g total) by mouth 4 (four) times daily. 08/24/15   Hosie Poisson, MD    Allergies:   Allergies  Allergen Reactions  . Penicillins Anaphylaxis    Has patient had a PCN reaction causing immediate rash, facial/tongue/throat swelling, SOB or lightheadedness with hypotension: Yes Has patient had a PCN reaction causing severe rash involving mucus membranes or skin necrosis: No Has patient had a PCN reaction that required hospitalization No Has patient had a PCN reaction occurring within the last 10 years: No If all of the above answers are "NO", then may proceed  with Cephalosporin use.  . Sulfa Antibiotics     Social History:  Ambulatory  independently  Lives at home With family     reports that he has never smoked. He does not have any smokeless tobacco history on file. He reports that he does not drink alcohol.     Family History: family history includes CAD in his brother; Diabetes in his mother; Hypertension in his mother; Lymphoma in his father; Stroke in his brother.    Physical Exam: Patient Vitals for the past 24 hrs:  BP Temp Temp src Pulse Resp SpO2 Height Weight  12/14/15 1750 187/98 mmHg 98.8 F (37.1 C) Oral 102 20 100 % - -  12/14/15 1630 158/86 mmHg - - - 14 - - -  12/14/15 1530 166/99 mmHg - - 94 24 100 % - -  12/14/15 1430 165/89 mmHg - - 105 17 100 % - -  12/14/15 1345 164/87 mmHg - - 98 - 100 % - -  12/14/15 1245 164/96 mmHg - - 103 17 97 % - -  12/14/15 1233 - 98.7 F (37.1 C) Rectal - - - - -  12/14/15 1230 151/91 mmHg - - 102 - 98 % - -  12/14/15 1222 149/89 mmHg 99.1 F (37.3 C) Oral  101 15 98 % - -  12/14/15 1215 149/89 mmHg - - - 11 - - -  12/14/15 1213 - - - - - - 5' (1.524 m) 48.988 kg (108 lb)  12/14/15 1208 - - - - - 98 % - -    1. General:  in No Acute distress 2. Psychological: lethargic not Oriented 3. Head/ENT:    Dry Mucous Membranes                          Head Non traumatic, neck supple                            Poor Dentition                             Down syndrome facies noted 4. SKIN:  decreased Skin turgor,  Skin clean Dry and intact no rash 5. Heart: Regular rate and rhythm no  Murmur, Rub or gallop 6. Lungs:  Clear to auscultation bilaterally, no wheezes or crackles   7. Abdomen: Soft, RUQ-tenderness  tender firm urinary bladder noted, Non distended 8. Lower extremities: no clubbing, cyanosis, or edema 9. Neurologically Grossly intact, moving all 4 extremities equally 10. MSK: Normal range of motion  body mass index is 21.09 kg/(m^2).   Labs on Admission:   Results for  orders placed or performed during the hospital encounter of 12/14/15 (from the past 24 hour(s))  Comprehensive metabolic panel     Status: Abnormal   Collection Time: 12/14/15 12:24 PM  Result Value Ref Range   Sodium 137 135 - 145 mmol/L   Potassium 3.5 3.5 - 5.1 mmol/L   Chloride 98 (L) 101 - 111 mmol/L   CO2 25 22 - 32 mmol/L   Glucose, Bld 178 (H) 65 - 99 mg/dL   BUN 14 6 - 20 mg/dL   Creatinine, Ser 1.43 (H) 0.61 - 1.24 mg/dL   Calcium 8.7 (L) 8.9 - 10.3 mg/dL   Total Protein 6.6 6.5 - 8.1 g/dL   Albumin 2.7 (L) 3.5 - 5.0 g/dL   AST 27 15 - 41 U/L   ALT 10 (L) 17 - 63 U/L   Alkaline Phosphatase 61 38 - 126 U/L   Total Bilirubin 0.6 0.3 - 1.2 mg/dL   GFR calc non Af Amer 51 (L) >60 mL/min   GFR calc Af Amer 59 (L) >60 mL/min   Anion gap 14 5 - 15  CBC with Differential     Status: Abnormal   Collection Time: 12/14/15 12:24 PM  Result Value Ref Range   WBC 13.8 (H) 4.0 - 10.5 K/uL   RBC 4.12 (L) 4.22 - 5.81 MIL/uL   Hemoglobin 11.3 (L) 13.0 - 17.0 g/dL   HCT 34.0 (L) 39.0 - 52.0 %   MCV 82.5 78.0 - 100.0 fL   MCH 27.4 26.0 - 34.0 pg   MCHC 33.2 30.0 - 36.0 g/dL   RDW 16.8 (H) 11.5 - 15.5 %   Platelets 270 150 - 400 K/uL   Neutrophils Relative % 84 %   Neutro Abs 11.5 (H) 1.7 - 7.7 K/uL   Lymphocytes Relative 6 %   Lymphs Abs 0.8 0.7 - 4.0 K/uL   Monocytes Relative 10 %   Monocytes Absolute 1.4 (H) 0.1 - 1.0 K/uL   Eosinophils Relative 0 %   Eosinophils Absolute 0.0 0.0 -  0.7 K/uL   Basophils Relative 0 %   Basophils Absolute 0.0 0.0 - 0.1 K/uL  APTT     Status: None   Collection Time: 12/14/15 12:24 PM  Result Value Ref Range   aPTT 26 24 - 37 seconds  Protime-INR     Status: None   Collection Time: 12/14/15 12:24 PM  Result Value Ref Range   Prothrombin Time 14.5 11.6 - 15.2 seconds   INR 1.11 0.00 - 1.49  I-Stat CG4 Lactic Acid, ED     Status: Abnormal   Collection Time: 12/14/15 12:47 PM  Result Value Ref Range   Lactic Acid, Venous 4.83 (HH) 0.5 - 2.0  mmol/L   Comment NOTIFIED PHYSICIAN   Urinalysis, Routine w reflex microscopic     Status: Abnormal   Collection Time: 12/14/15  1:38 PM  Result Value Ref Range   Color, Urine YELLOW YELLOW   APPearance CLEAR CLEAR   Specific Gravity, Urine 1.017 1.005 - 1.030   pH 6.5 5.0 - 8.0   Glucose, UA NEGATIVE NEGATIVE mg/dL   Hgb urine dipstick MODERATE (A) NEGATIVE   Bilirubin Urine NEGATIVE NEGATIVE   Ketones, ur NEGATIVE NEGATIVE mg/dL   Protein, ur >300 (A) NEGATIVE mg/dL   Nitrite NEGATIVE NEGATIVE   Leukocytes, UA NEGATIVE NEGATIVE  Urine microscopic-add on     Status: Abnormal   Collection Time: 12/14/15  1:38 PM  Result Value Ref Range   Squamous Epithelial / LPF NONE SEEN NONE SEEN   WBC, UA NONE SEEN 0 - 5 WBC/hpf   RBC / HPF 6-30 0 - 5 RBC/hpf   Bacteria, UA RARE (A) NONE SEEN  Influenza panel by PCR (type A & B, H1N1)     Status: None   Collection Time: 12/14/15  2:27 PM  Result Value Ref Range   Influenza A By PCR NEGATIVE NEGATIVE   Influenza B By PCR NEGATIVE NEGATIVE   H1N1 flu by pcr NOT DETECTED NOT DETECTED  Lactic acid, plasma     Status: None   Collection Time: 12/14/15  4:11 PM  Result Value Ref Range   Lactic Acid, Venous 1.8 0.5 - 2.0 mmol/L  Procalcitonin - Baseline     Status: None   Collection Time: 12/14/15  4:11 PM  Result Value Ref Range   Procalcitonin <0.10 ng/mL  Amylase     Status: None   Collection Time: 12/14/15  4:11 PM  Result Value Ref Range   Amylase 35 28 - 100 U/L  Lipase, blood     Status: None   Collection Time: 12/14/15  4:11 PM  Result Value Ref Range   Lipase 28 11 - 51 U/L  Lactate dehydrogenase     Status: Abnormal   Collection Time: 12/14/15  4:11 PM  Result Value Ref Range   LDH 226 (H) 98 - 192 U/L    UA  no evidence of UTI  Lab Results  Component Value Date   HGBA1C 5.6 08/10/2015    Estimated Creatinine Clearance: 36.6 mL/min (by C-G formula based on Cr of 1.43).  BNP (last 3 results) No results for input(s):  PROBNP in the last 8760 hours.  Other results:  I have pearsonaly reviewed this: ECG REPORT  Rate:107  Rhythm:  sinus tachycardia with evidence of right atrial enlargement  ST&T Change:  mild ST depressions in the lateral leads  QTC 476  Filed Weights   12/14/15 1213  Weight: 48.988 kg (108 lb)     Cultures:  Component Value Date/Time   SDES BLOOD RIGHT HAND 08/18/2015 1537   SPECREQUEST IN PEDIATRIC BOTTLE 1.5CC 08/18/2015 1537   CULT NO GROWTH 5 DAYS 08/18/2015 1537   REPTSTATUS 08/23/2015 FINAL 08/18/2015 1537     Radiological Exams on Admission: Ct Abdomen Pelvis Wo Contrast  12/14/2015  CLINICAL DATA:  Lethargy, sepsis. EXAM: CT ABDOMEN AND PELVIS WITHOUT CONTRAST TECHNIQUE: Multidetector CT imaging of the abdomen and pelvis was performed following the standard protocol without IV contrast. COMPARISON:  CT scan of August 18, 2015. FINDINGS: Multilevel degenerative disc disease is noted in the lumbar spine. Visualized lung bases are unremarkable. Multiple gallstones are noted with mild gallbladder wall thickening and possible pericholecystic fluid. No focal abnormality seen in the liver, spleen or pancreas on these unenhanced images. Adrenal glands are unremarkable. Mild bilateral hydroureteronephrosis is noted secondary to severe urinary bladder distention. No renal or ureteral calculi are noted. The appendix appears normal. There is no evidence of bowel obstruction. Status post gastrojejunostomy. No significant adenopathy is noted. Stable small fat containing ventral hernia is seen anteriorly in the pelvis. IMPRESSION: Mild bilateral hydroureteronephrosis secondary to severe urinary bladder distention. No renal or ureteral calculi are noted. Stable small fat containing ventral hernia seen in the pelvis. Cholelithiasis is noted with mild gallbladder wall thickening and possible pericholecystic fluid. Acute cholecystitis cannot be excluded, and HIDA scan may be performed for  further evaluation. Electronically Signed   By: Marijo Conception, M.D.   On: 12/14/2015 16:26   Ct Head Wo Contrast  12/14/2015  CLINICAL DATA:  Altered mental status. EXAM: CT HEAD WITHOUT CONTRAST TECHNIQUE: Contiguous axial images were obtained from the base of the skull through the vertex without intravenous contrast. COMPARISON:  None. FINDINGS: Bony calvarium appears intact. Minimal diffuse cortical atrophy is noted. Mild chronic ischemic white matter disease is noted. Focal low density is seen involving superior portion of right parietal cortex concerning for infarction of indeterminate age. No mass effect midline shift is noted. Ventricular size is within normal limits. There is no evidence of mass lesion or hemorrhage. IMPRESSION: Minimal diffuse cortical atrophy. Mild chronic ischemic white matter disease. Focal low density seen in the superior portion of the right parietal cortex concerning for infarction of indeterminate age. MRI is recommended for further evaluation. Electronically Signed   By: Marijo Conception, M.D.   On: 12/14/2015 14:18   Mr Brain Wo Contrast  12/14/2015  CLINICAL DATA:  Altered mental status. Decreased responsiveness. Abnormal CT scan. EXAM: MRI HEAD WITHOUT CONTRAST TECHNIQUE: Multiplanar, multiecho pulse sequences of the brain and surrounding structures were obtained without intravenous contrast. COMPARISON:  CT head without contrast from the same day. FINDINGS: A remote nonhemorrhagic infarct is present within the left parietal lobe, corresponding to the CT finding. No acute infarct, hemorrhage, or mass lesion is present. Mild generalized atrophy is present. There is more prominent posterior white matter loss with ex vacuo dilation of the lateral ventricles bilaterally. Remote lacunar infarcts are present in the left basal ganglia. The internal auditory canals are within normal limits bilaterally. White matter changes extend into the brainstem. The cerebellum is unremarkable.  Flow is present in the major intracranial arteries. Bilateral lens replacements are present. Globes and orbits are otherwise within normal limits. The paranasal sinuses are clear. The frontal sinuses are not pneumatized. The a left mastoid effusion is present. No obstructing nasopharyngeal lesion is evident. IMPRESSION: 1. No acute intracranial abnormality. 2. Remote left parietal lobe nonhemorrhagic infarct. 3. Mild atrophy and white matter  disease bilaterally extending into the brainstem. 4. Remote lacunar infarcts of the left cerebellum. Electronically Signed   By: San Morelle M.D.   On: 12/14/2015 17:56   Dg Chest Port 1 View  12/14/2015  CLINICAL DATA:  Altered mental status EXAM: PORTABLE CHEST 1 VIEW COMPARISON:  08/17/2015 FINDINGS: Heart and mediastinal contours are within normal limits. No focal opacities or effusions. No acute bony abnormality. Degenerative changes in the shoulders and thoracic spine. IMPRESSION: No active cardiopulmonary disease. Electronically Signed   By: Rolm Baptise M.D.   On: 12/14/2015 13:37    Chart has been reviewed  Family  at  Bedside  plan of care was discussed with Sister Kyung Rudd [phone] Brother Alexes Lannin [phone] cell  Assessment/Plan  65 year old gentleman history of trisomy 7 presents with confusion was found to have urinary bladder obstruction and evidence of possible cholecystitis on CT scan with Gen. surgery been aware we'll see patient in consult and initiate IV Antibiotics Pl., Foley for urinary bladder obstruction  Present on Admission:  . chronic kidney disease currently creatinine at baseline continue to hydrate and monitor creatinine  . acute encephalopathy- worsening from baseline dementia in the setting of bladder outlet obstruction as well as probable cholecystitis. We'll treat underlying issues MRI was done with no evidence of acute CVA.  History of dementia  - in a setting of Down syndrome. Expect some  degree of sundowning while in the hospital  . Calculus of gallbladder with acute cholecystitis - appreciate surgical consult continue IV antibiotics keep nothing by mouth height is scattered in the morning  . PUD (peptic ulcer disease) continue Protonix currently appears to be stable    urinary bladder obstruction with hydronephrosis - Will Pl., Foley order Flomax will need to follow-up with urology after discharge Elevated blood pressure - in a setting of abdominal pain and discomfort. We'll treat pain if persists consider initiating of an agent Prophylaxis: Lovenox   CODE STATUS:  DNR/DNI as per  family  Disposition:To home once workup is complete and patient is stable  Other plan as per orders.  I have spent a total of 57 min on this admission    Sussie Minor 12/14/2015, 7:39 PM    [facility]  Pager [phone]   after 2 AM please page floor coverage PA If 7AM-7PM, please contact the day team taking care of the patient  [email or web address]  Password [email or web address]

## 2015-12-14 NOTE — ED Notes (Signed)
hospitalist at the bedside 

## 2015-12-14 NOTE — Progress Notes (Addendum)
Pharmacy Antibiotic Note  Isaac Ramirez is a 65 y.o. male admitted on 12/14/2015 with sepsis.  Pharmacy has been consulted for vancomycin, levaquin, aztreonam dosing. LA 4.8, SCr 1.43, eCrCl 35-40 ml/min.  Plan: -vancomycin 1 g IV x1 then 750 mg/24h -aztreonam 2 g IV x1 then 1g/8h -Levaquin 750 mg IV x1 then 750/48h   Height: 5' (152.4 cm) Weight: 108 lb (48.988 kg) IBW/kg (Calculated) : 50  Temp (24hrs), Avg:98.9 F (37.2 C), Min:98.7 F (37.1 C), Max:99.1 F (37.3 C)   Recent Labs Lab 12/14/15 1224 12/14/15 1247  WBC 13.8*  --   LATICACIDVEN  --  4.83*    CrCl cannot be calculated (Patient has no serum creatinine result on file.).    Allergies  Allergen Reactions  . Penicillins Anaphylaxis    Has patient had a PCN reaction causing immediate rash, facial/tongue/throat swelling, SOB or lightheadedness with hypotension: Yes Has patient had a PCN reaction causing severe rash involving mucus membranes or skin necrosis: No Has patient had a PCN reaction that required hospitalization No Has patient had a PCN reaction occurring within the last 10 years: No If all of the above answers are "NO", then may proceed with Cephalosporin use.  . Sulfa Antibiotics     Antimicrobials this admission: 4/12 vancomycin  >>  4/12 levaquin >>  4/12 aztreonam >>  Dose adjustments this admission: NA  Microbiology results: 4/12 BCx:  4/12 UCx:     Thank you for allowing pharmacy to be a part of this patient's care.  Harvel Quale 12/14/2015 1:07 PM    Pharmacy Code Sepsis Protocol  Time of code sepsis page: 1258 []  Antibiotics delivered at 1305 []  Antibiotics administered prior to code at  (if checked, omit next 2 questions)  Were antibiotics ordered at the time of the code sepsis page? Yes Was it required to contact the physician? [x]  Physician not contacted []  Physician contacted to order antibiotics for code sepsis []  Physician contacted to recommend changing  antibiotics  Pharmacy consulted for: vanc, levaquin, aztreonam  Anti-infectives    Start     Dose/Rate Route Frequency Ordered Stop   12/14/15 1300  levofloxacin (LEVAQUIN) IVPB 750 mg     750 mg 100 mL/hr over 90 Minutes Intravenous  Once 12/14/15 1253     12/14/15 1300  aztreonam (AZACTAM) 2 g in dextrose 5 % 50 mL IVPB     2 g 100 mL/hr over 30 Minutes Intravenous  Once 12/14/15 1253     12/14/15 1300  vancomycin (VANCOCIN) IVPB 1000 mg/200 mL premix     1,000 mg 200 mL/hr over 60 Minutes Intravenous  Once 12/14/15 1253          Nurse education provided: []  Minutes left to administer antibiotics to achieve 1 hour goal []  Correct order of antibiotic administration []  Antibiotic Y-site compatibilities     Kenneshia Rehm, Jake Church, PharmD 12/14/2015, 1:07 PM

## 2015-12-14 NOTE — ED Notes (Signed)
Pt brought in EMS from home for lethargy/AMS.  Pt has hx of MR.  Family reports that pt stayed in bed the majority of the day yesterday and did the same this morning.  EMS reports that he has had decreased level of consciousness today and feels warm to touch.

## 2015-12-15 ENCOUNTER — Inpatient Hospital Stay (HOSPITAL_COMMUNITY): Payer: Medicare Other

## 2015-12-15 DIAGNOSIS — A419 Sepsis, unspecified organism: Principal | ICD-10-CM

## 2015-12-15 DIAGNOSIS — N183 Chronic kidney disease, stage 3 unspecified: Secondary | ICD-10-CM | POA: Diagnosis present

## 2015-12-15 DIAGNOSIS — Q909 Down syndrome, unspecified: Secondary | ICD-10-CM

## 2015-12-15 DIAGNOSIS — G9340 Encephalopathy, unspecified: Secondary | ICD-10-CM

## 2015-12-15 DIAGNOSIS — D72829 Elevated white blood cell count, unspecified: Secondary | ICD-10-CM

## 2015-12-15 DIAGNOSIS — K8000 Calculus of gallbladder with acute cholecystitis without obstruction: Secondary | ICD-10-CM

## 2015-12-15 DIAGNOSIS — K311 Adult hypertrophic pyloric stenosis: Secondary | ICD-10-CM

## 2015-12-15 DIAGNOSIS — K279 Peptic ulcer, site unspecified, unspecified as acute or chronic, without hemorrhage or perforation: Secondary | ICD-10-CM

## 2015-12-15 DIAGNOSIS — E038 Other specified hypothyroidism: Secondary | ICD-10-CM

## 2015-12-15 LAB — CBC
HEMATOCRIT: 35.1 % — AB (ref 39.0–52.0)
HEMOGLOBIN: 11.2 g/dL — AB (ref 13.0–17.0)
MCH: 26.3 pg (ref 26.0–34.0)
MCHC: 31.9 g/dL (ref 30.0–36.0)
MCV: 82.4 fL (ref 78.0–100.0)
Platelets: 198 10*3/uL (ref 150–400)
RBC: 4.26 MIL/uL (ref 4.22–5.81)
RDW: 17 % — ABNORMAL HIGH (ref 11.5–15.5)
WBC: 13.6 10*3/uL — AB (ref 4.0–10.5)

## 2015-12-15 LAB — COMPREHENSIVE METABOLIC PANEL
ALT: 72 U/L — ABNORMAL HIGH (ref 17–63)
ANION GAP: 9 (ref 5–15)
AST: 104 U/L — ABNORMAL HIGH (ref 15–41)
Albumin: 2.3 g/dL — ABNORMAL LOW (ref 3.5–5.0)
Alkaline Phosphatase: 298 U/L — ABNORMAL HIGH (ref 38–126)
BILIRUBIN TOTAL: 0.6 mg/dL (ref 0.3–1.2)
BUN: 11 mg/dL (ref 6–20)
CHLORIDE: 107 mmol/L (ref 101–111)
CO2: 25 mmol/L (ref 22–32)
Calcium: 8.4 mg/dL — ABNORMAL LOW (ref 8.9–10.3)
Creatinine, Ser: 1.2 mg/dL (ref 0.61–1.24)
GFR calc Af Amer: >60 mL/min (ref >60)
Glucose, Bld: 104 mg/dL — ABNORMAL HIGH (ref 65–99)
POTASSIUM: 3.9 mmol/L (ref 3.5–5.1)
Sodium: 141 mmol/L (ref 135–145)
TOTAL PROTEIN: 6.1 g/dL — AB (ref 6.5–8.1)

## 2015-12-15 LAB — PROCALCITONIN: PROCALCITONIN: 0.23 ng/mL

## 2015-12-15 LAB — MAGNESIUM: MAGNESIUM: 1.4 mg/dL — AB (ref 1.7–2.4)

## 2015-12-15 LAB — TSH: TSH: 0.676 u[IU]/mL (ref 0.350–4.500)

## 2015-12-15 LAB — PHOSPHORUS: Phosphorus: 3 mg/dL (ref 2.5–4.6)

## 2015-12-15 MED ORDER — PNEUMOCOCCAL VAC POLYVALENT 25 MCG/0.5ML IJ INJ
0.5000 mL | INJECTION | INTRAMUSCULAR | Status: DC
  Filled 2015-12-15: qty 0.5

## 2015-12-15 MED ORDER — VANCOMYCIN HCL 500 MG IV SOLR
500.0000 mg | Freq: Two times a day (BID) | INTRAVENOUS | Status: DC
  Administered 2015-12-15 – 2015-12-17 (×4): 500 mg via INTRAVENOUS
  Filled 2015-12-15 (×5): qty 500

## 2015-12-15 MED ORDER — TECHNETIUM TC 99M MEBROFENIN IV KIT: 5.0000 | PACK | Freq: Once | INTRAVENOUS | Status: DC | PRN

## 2015-12-15 MED ORDER — LORAZEPAM 2 MG/ML IJ SOLN
0.5000 mg | Freq: Once | INTRAMUSCULAR | Status: AC | PRN
  Administered 2015-12-15: 0.5 mg via INTRAVENOUS
  Filled 2015-12-15: qty 1

## 2015-12-15 NOTE — Consult Note (Addendum)
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  In ER was found to have lactate 4.8, mild abd pain, AMS and PCCM consulted for sepsis.    PAST MEDICAL HISTORY :  He  has a past medical history of PUD (peptic ulcer disease); Hypothyroidism; and Trisomy 21.  PAST SURGICAL HISTORY: He  has past surgical history that includes Roux-en-y procedure (2007); Vagotomy; and Esophagogastroduodenoscopy (N/A, 08/11/2015).  Allergies  Allergen Reactions  . Penicillins Anaphylaxis    Has patient had a PCN reaction causing immediate rash, facial/tongue/throat swelling, SOB or lightheadedness with hypotension: Yes Has patient had a PCN reaction causing severe rash involving mucus membranes or skin necrosis: No Has patient had a PCN reaction that required hospitalization No Has patient had a PCN reaction occurring within the last 10 years: No If all of the above answers are "NO", then may proceed with Cephalosporin use.  . Sulfa Antibiotics     No current facility-administered medications on file prior to encounter.   Current Outpatient Prescriptions on File Prior to Encounter  Medication Sig  . feeding supplement, ENSURE ENLIVE, (ENSURE ENLIVE) LIQD Take 237 mLs by mouth 2 (two) times daily between meals. (Patient not taking: Reported on 12/15/2015)  . levothyroxine (SYNTHROID, LEVOTHROID) 150 MCG tablet Take 150 mcg by mouth daily before breakfast.  . pantoprazole sodium (PROTONIX) 40 mg/20 mL PACK Take 20 mLs (40 mg total) by mouth 2 (two) times daily. (Patient not taking: Reported on 12/15/2015)     FAMILY HISTORY:  His indicated that his mother is alive. He indicated that his father is deceased. He indicated that his brother is alive.   SOCIAL HISTORY: He  reports that he has never smoked. He does not have any smokeless tobacco history on file. He reports that he does not drink alcohol.  REVIEW OF SYSTEMS:   Unable r/t lethargy.  Per family at bedside.  Mild abd pain, lethargy, felt warm to touch.  No reports of vomiting, cough, dyspnea, hemoptysis, diarrhea.   SUBJECTIVE:    VITAL SIGNS: BP 142/83 mmHg  Pulse 96  Temp(Src) 98.4 F (36.9 C) (Oral)  Resp 16  Ht 5' (1.524 m)  Wt 113 lb 12.1 oz (51.6 kg)  BMI 22.22 kg/m2  SpO2 97%  HEMODYNAMICS:    VENTILATOR SETTINGS:    INTAKE / OUTPUT: I/O last 3 completed shifts: In: 1950 [I.V.:1000; IV Piggyback:950] Out: 1400 [id number]  PHYSICAL EXAMINATION: General:  somnolent Neuro:  No gross focal deficits HEENT:  Moist mucus membranes Cardiovascular:  S1, S2, RRR Lungs:  Clear Abdomen:  + BS Musculoskeletal:  No edema  LABS:  BMET  Recent Labs Lab 12/14/15 1224 12/15/15 0441  NA 137 141  K 3.5 3.9  CL 98* 107  CO2 25 25  BUN 14 11  CREATININE 1.43* 1.20  GLUCOSE 178* 104*    Electrolytes  Recent Labs Lab 12/14/15 1224 12/15/15 0441  CALCIUM 8.7* 8.4*  MG  --  1.4*  PHOS  --  3.0    CBC  Recent Labs Lab 12/14/15 1224 12/15/15 0441  WBC 13.8* 13.6*  HGB  11.3* 11.2*  HCT 34.0* 35.1*  PLT 270 198    Coag's  Recent Labs Lab 12/14/15 1224  APTT 26  INR 1.11    Sepsis Markers  Recent Labs Lab 12/14/15 1247 12/14/15 1611 12/15/15 0441  LATICACIDVEN 4.83* 1.8  --   PROCALCITON  --  <0.10 0.23    ABG No results for input(s): PHART, PCO2ART, PO2ART in the last 168 hours.  Liver Enzymes  Recent Labs Lab 12/14/15 1224 12/15/15 0441  AST 27 104*  ALT 10* 72*  ALKPHOS 61 298*  BILITOT 0.6 0.6  ALBUMIN 2.7* 2.3*    Cardiac Enzymes No results for input(s):  TROPONINI, PROBNP in the last 168 hours.  Glucose No results for input(s): GLUCAP in the last 168 hours.  Imaging Ct Abdomen Pelvis Wo Contrast  12/14/2015  CLINICAL DATA:  Lethargy, sepsis. EXAM: CT ABDOMEN AND PELVIS WITHOUT CONTRAST TECHNIQUE: Multidetector CT imaging of the abdomen and pelvis was performed following the standard protocol without IV contrast. COMPARISON:  CT scan of August 18, 2015. FINDINGS: Multilevel degenerative disc disease is noted in the lumbar spine. Visualized lung bases are unremarkable. Multiple gallstones are noted with mild gallbladder wall thickening and possible pericholecystic fluid. No focal abnormality seen in the liver, spleen or pancreas on these unenhanced images. Adrenal glands are unremarkable. Mild bilateral hydroureteronephrosis is noted secondary to severe urinary bladder distention. No renal or ureteral calculi are noted. The appendix appears normal. There is no evidence of bowel obstruction. Status post gastrojejunostomy. No significant adenopathy is noted. Stable small fat containing ventral hernia is seen anteriorly in the pelvis. IMPRESSION: Mild bilateral hydroureteronephrosis secondary to severe urinary bladder distention. No renal or ureteral calculi are noted. Stable small fat containing ventral hernia seen in the pelvis. Cholelithiasis is noted with mild gallbladder wall thickening and possible pericholecystic fluid. Acute cholecystitis cannot be excluded, and HIDA scan may be performed for further evaluation. Electronically Signed   By: Marijo Conception, M.D.   On: 12/14/2015 16:26   Ct Head Wo Contrast  12/14/2015  CLINICAL DATA:  Altered mental status. EXAM: CT HEAD WITHOUT CONTRAST TECHNIQUE: Contiguous axial images were obtained from the base of the skull through the vertex without intravenous contrast. COMPARISON:  None. FINDINGS: Bony calvarium appears intact. Minimal diffuse cortical atrophy is noted. Mild chronic ischemic white matter  disease is noted. Focal low density is seen involving superior portion of right parietal cortex concerning for infarction of indeterminate age. No mass effect midline shift is noted. Ventricular size is within normal limits. There is no evidence of mass lesion or hemorrhage. IMPRESSION: Minimal diffuse cortical atrophy. Mild chronic ischemic white matter disease. Focal low density seen in the superior portion of the right parietal cortex concerning for infarction of indeterminate age. MRI is recommended for further evaluation. Electronically Signed   By: Marijo Conception, M.D.   On: 12/14/2015 14:18   Mr Brain Wo Contrast  12/14/2015  CLINICAL DATA:  Altered mental status. Decreased responsiveness. Abnormal CT scan. EXAM: MRI HEAD WITHOUT CONTRAST TECHNIQUE: Multiplanar, multiecho pulse sequences of the brain and surrounding structures were obtained without intravenous contrast. COMPARISON:  CT head without contrast from the same day. FINDINGS: A remote nonhemorrhagic infarct is present within the left parietal lobe, corresponding to the CT finding. No acute infarct, hemorrhage, or mass lesion is present. Mild generalized atrophy is present. There is more prominent posterior white matter loss with ex vacuo dilation of the lateral ventricles bilaterally. Remote lacunar infarcts are present in  the left basal ganglia. The internal auditory canals are within normal limits bilaterally. White matter changes extend into the brainstem. The cerebellum is unremarkable. Flow is present in the major intracranial arteries. Bilateral lens replacements are present. Globes and orbits are otherwise within normal limits. The paranasal sinuses are clear. The frontal sinuses are not pneumatized. The a left mastoid effusion is present. No obstructing nasopharyngeal lesion is evident. IMPRESSION: 1. No acute intracranial abnormality. 2. Remote left parietal lobe nonhemorrhagic infarct. 3. Mild atrophy and white matter disease  bilaterally extending into the brainstem. 4. Remote lacunar infarcts of the left cerebellum. Electronically Signed   By: San Morelle M.D.   On: 12/14/2015 17:56   Dg Chest Port 1 View  12/14/2015  CLINICAL DATA:  Altered mental status EXAM: PORTABLE CHEST 1 VIEW COMPARISON:  08/17/2015 FINDINGS: Heart and mediastinal contours are within normal limits. No focal opacities or effusions. No acute bony abnormality. Degenerative changes in the shoulders and thoracic spine. IMPRESSION: No active cardiopulmonary disease. Electronically Signed   By: Rolm Baptise M.D.   On: 12/14/2015 13:37     STUDIES:  CT head 4/12>>> CT abd/pelvis 4/12>>>  CULTURES: BC x 2 4/12>>> Urine 4/12>>>  ANTIBIOTICS: Vanc 4/12>>>4/12 levaquin 4/12>>> 4/12 Flagyl 4/12>>> Cipro 4/13 >>  SIGNIFICANT EVENTS:  LINES/TUBES:  DISCUSSION: 65yo male with hx trisomy 21, recent admit for gastric outlet obstruction admitted 4/12 with sepsis of unclear etiology, suspect abd source.     ASSESSMENT / PLAN: Sepsis  ? Acute cholecystitis  Broad spectrum abx as above- Cipro, flagyl Lactic acid has normalized Follow HIDA Surgery on board.  PCCM will sign off. Please call with any questions.  Marshell Garfinkel MD  Pulmonary and Critical Care Pager [phone] If no answer or after 3pm call: [phone] 12/15/2015, 12:23 PM

## 2015-12-15 NOTE — Progress Notes (Signed)
Subjective: Unable to provide history. Family member stays he slept well.   Objective: Vital signs in last 24 hours: Temp:  [98.2 F (36.8 C)-99.1 F (37.3 C)] 98.4 F (36.9 C) (04/13 0540) Pulse Rate:  [88-111] 96 (04/13 0540) Resp:  [11-24] 16 (04/13 0540) BP: (121-187)/(75-117) 142/83 mmHg (04/13 0540) SpO2:  [90 %-100 %] 97 % (04/13 0540) Weight:  [48.988 kg (108 lb)-51.6 kg (113 lb 12.1 oz)] 51.6 kg (113 lb 12.1 oz) (04/12 2108) Last BM Date: 12/13/15  Intake/Output from previous day: 04/12 0701 - 04/13 0700 In: 1950 [I.V.:1000; IV Piggyback:950] Out: 1400 [Urine:1400] Intake/Output this shift:    Awake, non toxic Soft, nd, old upper midline scar. Some grimacing to palpation in RUQ. No peritonitis  Lab Results:   Recent Labs  12/14/15 1224 12/15/15 0441  WBC 13.8* 13.6*  HGB 11.3* 11.2*  HCT 34.0* 35.1*  PLT 270 198   BMET  Recent Labs  12/14/15 1224 12/15/15 0441  NA 137 141  K 3.5 3.9  CL 98* 107  CO2 25 25  GLUCOSE 178* 104*  BUN 14 11  CREATININE 1.43* 1.20  CALCIUM 8.7* 8.4*   Hepatic Function Latest Ref Rng 12/15/2015 12/14/2015 08/24/2015  Total Protein 6.5 - 8.1 g/dL 6.1(L) 6.6 5.5(L)  Albumin 3.5 - 5.0 g/dL 2.3(L) 2.7(L) 1.5(L)  AST 15 - 41 U/L 104(H) 27 20  ALT 17 - 63 U/L 72(H) 10(L) 13(L)  Alk Phosphatase 38 - 126 U/L 298(H) 61 103  Total Bilirubin 0.3 - 1.2 mg/dL 0.6 0.6 0.2(L)     PT/INR  Recent Labs  12/14/15 1224  LABPROT 14.5  INR 1.11   ABG No results for input(s): PHART, HCO3 in the last 72 hours.  Invalid input(s): PCO2, PO2  Studies/Results: Ct Abdomen Pelvis Wo Contrast  12/14/2015  CLINICAL DATA:  Lethargy, sepsis. EXAM: CT ABDOMEN AND PELVIS WITHOUT CONTRAST TECHNIQUE: Multidetector CT imaging of the abdomen and pelvis was performed following the standard protocol without IV contrast. COMPARISON:  CT scan of August 18, 2015. FINDINGS: Multilevel degenerative disc disease is noted in the lumbar spine.  Visualized lung bases are unremarkable. Multiple gallstones are noted with mild gallbladder wall thickening and possible pericholecystic fluid. No focal abnormality seen in the liver, spleen or pancreas on these unenhanced images. Adrenal glands are unremarkable. Mild bilateral hydroureteronephrosis is noted secondary to severe urinary bladder distention. No renal or ureteral calculi are noted. The appendix appears normal. There is no evidence of bowel obstruction. Status post gastrojejunostomy. No significant adenopathy is noted. Stable small fat containing ventral hernia is seen anteriorly in the pelvis. IMPRESSION: Mild bilateral hydroureteronephrosis secondary to severe urinary bladder distention. No renal or ureteral calculi are noted. Stable small fat containing ventral hernia seen in the pelvis. Cholelithiasis is noted with mild gallbladder wall thickening and possible pericholecystic fluid. Acute cholecystitis cannot be excluded, and HIDA scan may be performed for further evaluation. Electronically Signed   By: Marijo Conception, M.D.   On: 12/14/2015 16:26   Ct Head Wo Contrast  12/14/2015  CLINICAL DATA:  Altered mental status. EXAM: CT HEAD WITHOUT CONTRAST TECHNIQUE: Contiguous axial images were obtained from the base of the skull through the vertex without intravenous contrast. COMPARISON:  None. FINDINGS: Bony calvarium appears intact. Minimal diffuse cortical atrophy is noted. Mild chronic ischemic white matter disease is noted. Focal low density is seen involving superior portion of right parietal cortex concerning for infarction of indeterminate age. No mass effect midline shift is noted. Ventricular  size is within normal limits. There is no evidence of mass lesion or hemorrhage. IMPRESSION: Minimal diffuse cortical atrophy. Mild chronic ischemic white matter disease. Focal low density seen in the superior portion of the right parietal cortex concerning for infarction of indeterminate age. MRI is  recommended for further evaluation. Electronically Signed   By: Marijo Conception, M.D.   On: 12/14/2015 14:18   Mr Brain Wo Contrast  12/14/2015  CLINICAL DATA:  Altered mental status. Decreased responsiveness. Abnormal CT scan. EXAM: MRI HEAD WITHOUT CONTRAST TECHNIQUE: Multiplanar, multiecho pulse sequences of the brain and surrounding structures were obtained without intravenous contrast. COMPARISON:  CT head without contrast from the same day. FINDINGS: A remote nonhemorrhagic infarct is present within the left parietal lobe, corresponding to the CT finding. No acute infarct, hemorrhage, or mass lesion is present. Mild generalized atrophy is present. There is more prominent posterior white matter loss with ex vacuo dilation of the lateral ventricles bilaterally. Remote lacunar infarcts are present in the left basal ganglia. The internal auditory canals are within normal limits bilaterally. White matter changes extend into the brainstem. The cerebellum is unremarkable. Flow is present in the major intracranial arteries. Bilateral lens replacements are present. Globes and orbits are otherwise within normal limits. The paranasal sinuses are clear. The frontal sinuses are not pneumatized. The a left mastoid effusion is present. No obstructing nasopharyngeal lesion is evident. IMPRESSION: 1. No acute intracranial abnormality. 2. Remote left parietal lobe nonhemorrhagic infarct. 3. Mild atrophy and white matter disease bilaterally extending into the brainstem. 4. Remote lacunar infarcts of the left cerebellum. Electronically Signed   By: San Morelle M.D.   On: 12/14/2015 17:56   Dg Chest Port 1 View  12/14/2015  CLINICAL DATA:  Altered mental status EXAM: PORTABLE CHEST 1 VIEW COMPARISON:  08/17/2015 FINDINGS: Heart and mediastinal contours are within normal limits. No focal opacities or effusions. No acute bony abnormality. Degenerative changes in the shoulders and thoracic spine. IMPRESSION: No active  cardiopulmonary disease. Electronically Signed   By: Rolm Baptise M.D.   On: 12/14/2015 13:37    Anti-infectives: Anti-infectives    Start     Dose/Rate Route Frequency Ordered Stop   12/16/15 1300  levofloxacin (LEVAQUIN) IVPB 750 mg  Status:  Discontinued     750 mg 100 mL/hr over 90 Minutes Intravenous Every 48 hours 12/14/15 1333 12/14/15 2219   12/15/15 1300  vancomycin (VANCOCIN) IVPB 750 mg/150 ml premix  Status:  Discontinued     750 mg 150 mL/hr over 60 Minutes Intravenous Every 24 hours 12/14/15 1333 12/14/15 2221   12/15/15 1300  ciprofloxacin (CIPRO) IVPB 400 mg     400 mg 200 mL/hr over 60 Minutes Intravenous Every 12 hours 12/14/15 2221     12/14/15 2100  aztreonam (AZACTAM) 1 g in dextrose 5 % 50 mL IVPB  Status:  Discontinued     1 g 100 mL/hr over 30 Minutes Intravenous 3 times per day 12/14/15 1333 12/14/15 1437   12/14/15 1445  metroNIDAZOLE (FLAGYL) IVPB 500 mg     500 mg 100 mL/hr over 60 Minutes Intravenous Every 8 hours 12/14/15 1437     12/14/15 1300  levofloxacin (LEVAQUIN) IVPB 750 mg  Status:  Discontinued     750 mg 100 mL/hr over 90 Minutes Intravenous  Once 12/14/15 1253 12/14/15 1437   12/14/15 1300  aztreonam (AZACTAM) 2 g in dextrose 5 % 50 mL IVPB     2 g 100 mL/hr over 30 Minutes  Intravenous  Once 12/14/15 1253 12/14/15 1355   12/14/15 1300  vancomycin (VANCOCIN) IVPB 1000 mg/200 mL premix     1,000 mg 200 mL/hr over 60 Minutes Intravenous  Once 12/14/15 1253 12/14/15 1425      Assessment/Plan: Cholelithiasis Possible cholecystitis 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   Leighton Ruff. Redmond Pulling, MD, FACS General, Bariatric, & Minimally Invasive Surgery [facility], [place]   LOS: 1 day    Gayland Curry 12/15/2015

## 2015-12-15 NOTE — Progress Notes (Signed)
HIDA positive for cholecystitis  Had extensive conversation (30 min) with patient's mother, brother, and sister-in-law.  This is his second bout of cholecystitis in less than 4 months so therefore I think this is going to be an ongoing problem and antibiotics alone will not be sufficient. Discussed several options which included cholecystostomy tube placement versus cholecystectomy. Discussed pros and cons of each. Family believes he is not a good candidate for cholecystostomy tube placement due to his dementia and Down syndrome and tendency to pull at tubes. I agree with this. Because even with a cholecystostomy tube, I believe his chance for recurrent cholecystitis in the future would be fairly decent. We then discussed cholecystectomy in detail.   I discussed laparoscopic cholecystectomy with IOC in detail.  The pt family was shown diagrams detailing the procedure.  We discussed the risks and benefits of a laparoscopic cholecystectomy including, but not limited to bleeding, infection, injury to surrounding structures such as the intestine or liver, bile leak, retained gallstones, need to convert to an open procedure, prolonged diarrhea, blood clots such as  DVT, common bile duct injury, anesthesia risks, and possible need for additional procedures.  We discussed the typical post-operative recovery course. I explained that the likelihood of improvement of their symptoms is good. We discussed that he would be at higher risk for conversion to open procedure potentially due to scar tissueas well as because of his probable acute on chronic cholecystitis.  Because his LFTs bumped today and because of his prior upper abdominal surgery by Dr. Lucia Gaskins for his peptic ulcer disease which included an vagotomy, retrocolic Roux-en-Y gastrojejunostomy this changes the ultimate plan for tomorrow. If his LFTs continued to increase then this raises the concern for possible common bile duct stone. Because of his change in  his upper abdominal anatomy, ERCP would be technically difficult and potentially impossible. Therefore this would raise the potential for needing to do operative common bile duct exploration. Before embarking on such a feet we would probably recommend preoperative MRI MRCP. If his LFTs do increase, there is radiological concern for a common bile duct stone, then timing of surgery would be determined based on surgeon availability as well as confirming that the appropriative operative equipment is available.  This entire treatment algorithm and plan was discussed in length and in detail with the patient's family.   Repeat LFTs in a.m. If stable or normalizing then would recommend proceeding with cholecystectomy by my partner. If LFTs continue to increase then would recommend getting MRCP to delineate bile duct anatomy  Leighton Ruff. Redmond Pulling, MD, FACS General, Bariatric, & Minimally Invasive Surgery [facility] [place] [facility], [place]

## 2015-12-15 NOTE — Progress Notes (Signed)
UR COMPLETED  

## 2015-12-15 NOTE — Progress Notes (Signed)
CRITICAL VALUE ALERT  Critical value received:  Gram positive cocci on aerobic bottle from 12/14/15  Date of notification:  12/15/15   Time of notification:  [id number]  Critical value read back:Yes.    Nurse who received alert:  Emberlyn Burlison  MD notified (1st page):  Dr. Charlies Silvers  Time of first page:  1420  MD notified (2nd page):  Time of second page:  Responding MD:    Time MD responded:

## 2015-12-15 NOTE — Progress Notes (Addendum)
Patient ID: Isaac Ramirez, male   DOB: 05-07-1951, 65 y.o.   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 In ED, patient was found to have lactate 4.8, critical care has seen the pt in consultation for sepsis. Pt was also seen by surgery..  Assessment/Plan:    Principal Problem:   Acute metabolic encephalopathy - Likely related to acute infectious process - He does have evidence of remote CVA based on MR brain - No acute intracranial abnormality. 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  - Continue to monitor mental status   Active Problems:   Sepsis ([place]) / Calculus of gallbladder with acute cholecystitis / Leukocytosis - Sepsis criteria met on admission with tachycardia, tachypnea, leukocytosis and lactic acidosis - Source of infection is cholecystitis - As of today 4/13, sepsis etiology resolved, pt is hemodynamically stable, lactic acid is WNL - CT abdomen showed cholelithiasis with mild gallbladder wall thickening and possible pericholecystic fluid. Acute cholecystitis cannot be excluded - Awaiting HIDA scan - Continue vanco, cipro and flagyl    Hypothyroidism - Continue synthroid    PUD (peptic ulcer disease) - Continue Protonix    Down syndrome / Trisomy 21 - Stable     CKD (chronic kidney disease) stage 3, GFR 30-59 ml/min - Baseline creatinine 3 months ago was around 1.5 and on this admission 1.43, at baseline range     Gastric out let obstruction - On prior admission, stable - NPO due to cholecystitis     DVT Prophylaxis  - Lovenox subQ ordered    Code Status: DNR/DNI Family Communication:   plan of care discussed with the patient's mother at the bedside  Disposition Plan: home once cleared by surgery   IV access:  Peripheral IV  Procedures and diagnostic studies:    Ct Abdomen Pelvis Wo Contrast 12/14/2015 Mild bilateral hydroureteronephrosis secondary to severe urinary bladder distention. No renal or ureteral calculi are noted. Stable small fat containing ventral hernia seen in the pelvis. Cholelithiasis is noted with mild gallbladder wall thickening and possible pericholecystic fluid. Acute cholecystitis cannot be excluded, and HIDA scan may be performed for further evaluation.   Ct Head Wo Contrast 12/14/2015  Minimal diffuse cortical atrophy. Mild chronic ischemic white matter disease. Focal low density seen in the superior portion of the right parietal cortex concerning for infarction of indeterminate age. MRI is recommended for further evaluation.   Mr Brain Wo Contrast 12/14/2015  1. No acute intracranial abnormality. 2. Remote left parietal lobe nonhemorrhagic infarct. 3. Mild atrophy and white matter disease bilaterally extending into the brainstem. 4. Remote lacunar infarcts of the left cerebellum.   Dg Chest Port 1 View 12/14/2015  No active cardiopulmonary disease.    Medical Consultants:  Surgery PCCM  Other Consultants:  None  IAnti-Infectives:   Cipro, flagyl, vanco 12/14/2015 -->   Leisa Lenz, MD  [facility] Pager [phone]  Time spent in minutes: 25 minutes  If 7PM-7AM, please contact night-coverage [email or web address] Password [facility] 12/15/2015, 11:31 AM   LOS: 1 day    HPI/Subjective: No acute overnight events. No vomiting.   Objective: Filed Vitals:   12/14/15 2108 12/15/15 0039 12/15/15 0257 12/15/15 0540  BP:  150/76  121/75 142/83  Pulse: 104 88 102 96  Temp: 98.2 F (36.8 C)  98.4 F (36.9 C) 98.4 F (36.9 C)  TempSrc: Oral  Axillary Oral  Resp: 18  18 16   Height:      Weight: 51.6 kg (113 lb 12.1 oz)     SpO2: 98% 97% 99%  97%    Intake/Output Summary (Last 24 hours) at 12/15/15 1131 Last data filed at 12/15/15 0654  Gross per 24 hour  Intake   1950 ml  Output   1400 ml  Net    550 ml    Exam:   General:  Pt is alert, not in acute distress  Cardiovascular: Regular rate and rhythm, S1/S2 appreciated   Respiratory: Clear to auscultation bilaterally, no wheezing, no crackles, no rhonchi  Abdomen: Soft, non tender, non distended, bowel sounds present  Extremities: No edema, pulses palpable bilaterally  Neuro: Grossly nonfocal  Data Reviewed: Basic Metabolic Panel:  Recent Labs Lab 12/14/15 1224 12/15/15 0441  NA 137 141  K 3.5 3.9  CL 98* 107  CO2 25 25  GLUCOSE 178* 104*  BUN 14 11  CREATININE 1.43* 1.20  CALCIUM 8.7* 8.4*  MG  --  1.4*  PHOS  --  3.0   Liver Function Tests:  Recent Labs Lab 12/14/15 1224 12/15/15 0441  AST 27 104*  ALT 10* 72*  ALKPHOS 61 298*  BILITOT 0.6 0.6  PROT 6.6 6.1*  ALBUMIN 2.7* 2.3*    Recent Labs Lab 12/14/15 1611  LIPASE 28  AMYLASE 35   No results for input(s): AMMONIA in the last 168 hours. CBC:  Recent Labs Lab 12/14/15 1224 12/15/15 0441  WBC 13.8* 13.6*  NEUTROABS 11.5*  --   HGB 11.3* 11.2*  HCT 34.0* 35.1*  MCV 82.5 82.4  PLT 270 198   Cardiac Enzymes: No results for input(s): CKTOTAL, CKMB, CKMBINDEX, TROPONINI in the last 168 hours. BNP: Invalid input(s): POCBNP CBG: No results for input(s): GLUCAP in the last 168 hours.  Recent Results (from the past 240 hour(s))  Urine culture     Status: None (Preliminary result)   Collection Time: 12/14/15  1:38 PM  Result Value Ref Range Status   Specimen Description URINE, CATHETERIZED  Final   Special Requests NONE  Final   Culture NO GROWTH < 24 HOURS  Final   Report Status PENDING  Incomplete     Scheduled Meds: . ciprofloxacin  400 mg Intravenous Q12H  . enoxaparin (LOVENOX) injection  40 mg Subcutaneous Q24H  . levothyroxine  75 mcg Intravenous QAC breakfast   . metronidazole  500 mg Intravenous Q8H  . pantoprazole (PROTONIX) IV  40 mg Intravenous Q12H  . sodium chloride flush  3 mL Intravenous Q12H  . sucralfate  1 g Oral QID  . tamsulosin  0.4 mg Oral Daily   Continuous Infusions:

## 2015-12-15 NOTE — Progress Notes (Signed)
Pharmacy Antibiotic Note  Isaac Ramirez is a 65 y.o. male admitted on 12/14/2015 with GPC bacteremia, intra-abdominal infection.  Pharmacy has been consulted for cipro/vancomycin dosing. Also on Flagyl. AF, wbc 13.6. PCT up <0.1 to 0.23 LA 4.8. SCr 1.43>1.22, CrCl 44.  Received vanc 1g IV x 1 dose yesterday.  Plan: Restart vanc 500mg  IV q12h Cipro 400mg  IV q12h Flagyl 500mg  IV q8h Monitor culture data and ability to narrow tx, renal function, and clinical course VT@SS  as indicated   Height: 5' (152.4 cm) Weight: 113 lb 12.1 oz (51.6 kg) IBW/kg (Calculated) : 50  Temp (24hrs), Avg:98.5 F (36.9 C), Min:98.2 F (36.8 C), Max:98.8 F (37.1 C)   Recent Labs Lab 12/14/15 1224 12/14/15 1247 12/14/15 1611 12/15/15 0441  WBC 13.8*  --   --  13.6*  CREATININE 1.43*  --   --  1.20  LATICACIDVEN  --  4.83* 1.8  --     Estimated Creatinine Clearance: 44.6 mL/min (by C-G formula based on Cr of 1.2).    Allergies  Allergen Reactions  . Penicillins Anaphylaxis    Has patient had a PCN reaction causing immediate rash, facial/tongue/throat swelling, SOB or lightheadedness with hypotension: Yes Has patient had a PCN reaction causing severe rash involving mucus membranes or skin necrosis: No Has patient had a PCN reaction that required hospitalization No Has patient had a PCN reaction occurring within the last 10 years: No If all of the above answers are "NO", then may proceed with Cephalosporin use.  . Sulfa Antibiotics     Antimicrobials this admission: 4/12 vanc x1; 4/13>> 4/12 aztreonam x1  4/12 levaquin x1  4/13 cipro >>  4/12 flagyl >>   Dose adjustments this admission: NA  Microbiology results: 4/12 BCx2: GPC 1/2 4/12 urine cx: ngtd  4/12 flu: neg   Elicia Lamp, PharmD, [facility] Clinical Pharmacist Pager [phone] 12/15/2015 2:27 PM

## 2015-12-16 ENCOUNTER — Inpatient Hospital Stay (HOSPITAL_COMMUNITY): Payer: Medicare Other | Admitting: Certified Registered Nurse Anesthetist

## 2015-12-16 ENCOUNTER — Encounter (HOSPITAL_COMMUNITY)
Admission: EM | Disposition: A | Discharge status: Home / Self Care | Payer: Self-pay | Source: Home / Self Care | Attending: Internal Medicine

## 2015-12-16 ENCOUNTER — Encounter (HOSPITAL_COMMUNITY): Payer: Self-pay | Admitting: Certified Registered Nurse Anesthetist

## 2015-12-16 DIAGNOSIS — K8001 Calculus of gallbladder with acute cholecystitis with obstruction: Secondary | ICD-10-CM

## 2015-12-16 HISTORY — PX: CHOLECYSTECTOMY: SHX55

## 2015-12-16 LAB — SURGICAL PCR SCREEN
MRSA, PCR: NEGATIVE
Staphylococcus aureus: POSITIVE — AB

## 2015-12-16 LAB — HEMOGLOBIN A1C
HEMOGLOBIN A1C: 5.6 % (ref 4.8–5.6)
Mean Plasma Glucose: 114 mg/dL

## 2015-12-16 LAB — CBC WITH DIFFERENTIAL/PLATELET
BASOS ABS: 0 10*3/uL (ref 0.0–0.1)
BASOS PCT: 0 %
Eosinophils Absolute: 0 10*3/uL (ref 0.0–0.7)
Eosinophils Relative: 0 %
HCT: 32 % — ABNORMAL LOW (ref 39.0–52.0)
Hemoglobin: 10 g/dL — ABNORMAL LOW (ref 13.0–17.0)
Lymphocytes Relative: 8 %
Lymphs Abs: 1 10*3/uL (ref 0.7–4.0)
MCH: 26.1 pg (ref 26.0–34.0)
MCHC: 31.3 g/dL (ref 30.0–36.0)
MCV: 83.6 fL (ref 78.0–100.0)
MONO ABS: 1 10*3/uL (ref 0.1–1.0)
MONOS PCT: 8 %
Neutro Abs: 10.2 10*3/uL — ABNORMAL HIGH (ref 1.7–7.7)
Neutrophils Relative %: 84 %
PLATELETS: 216 10*3/uL (ref 150–400)
RBC: 3.83 MIL/uL — ABNORMAL LOW (ref 4.22–5.81)
RDW: 17.1 % — AB (ref 11.5–15.5)
WBC: 12.2 10*3/uL — ABNORMAL HIGH (ref 4.0–10.5)

## 2015-12-16 LAB — URINE CULTURE: Culture: NO GROWTH

## 2015-12-16 LAB — COMPREHENSIVE METABOLIC PANEL
ALBUMIN: 1.9 g/dL — AB (ref 3.5–5.0)
ALT: 34 U/L (ref 17–63)
ANION GAP: 8 (ref 5–15)
AST: 32 U/L (ref 15–41)
Alkaline Phosphatase: 157 U/L — ABNORMAL HIGH (ref 38–126)
BILIRUBIN TOTAL: 0.7 mg/dL (ref 0.3–1.2)
BUN: 16 mg/dL (ref 6–20)
CHLORIDE: 108 mmol/L (ref 101–111)
CO2: 25 mmol/L (ref 22–32)
Calcium: 8.3 mg/dL — ABNORMAL LOW (ref 8.9–10.3)
Creatinine, Ser: 1.27 mg/dL — ABNORMAL HIGH (ref 0.61–1.24)
GFR calc Af Amer: >60 mL/min (ref >60)
GFR calc non Af Amer: 58 mL/min — ABNORMAL LOW (ref >60)
GLUCOSE: 85 mg/dL (ref 65–99)
POTASSIUM: 3 mmol/L — AB (ref 3.5–5.1)
SODIUM: 141 mmol/L (ref 135–145)
TOTAL PROTEIN: 5.6 g/dL — AB (ref 6.5–8.1)

## 2015-12-16 LAB — PROCALCITONIN: PROCALCITONIN: 0.54 ng/mL

## 2015-12-16 SURGERY — LAPAROSCOPIC CHOLECYSTECTOMY WITH INTRAOPERATIVE CHOLANGIOGRAM
Anesthesia: General | Site: Abdomen

## 2015-12-16 MED ORDER — 0.9 % SODIUM CHLORIDE (POUR BTL) OPTIME
TOPICAL | Status: DC | PRN
  Administered 2015-12-16: 1000 mL

## 2015-12-16 MED ORDER — MAGNESIUM SULFATE 2 GM/50ML IV SOLN
2.0000 g | Freq: Once | INTRAVENOUS | Status: AC
  Administered 2015-12-16: 2 g via INTRAVENOUS
  Filled 2015-12-16: qty 50

## 2015-12-16 MED ORDER — FENTANYL CITRATE (PF) 250 MCG/5ML IJ SOLN
INTRAMUSCULAR | Status: AC
  Filled 2015-12-16: qty 5

## 2015-12-16 MED ORDER — BUPIVACAINE HCL (PF) 0.25 % IJ SOLN
INTRAMUSCULAR | Status: AC
  Filled 2015-12-16: qty 30

## 2015-12-16 MED ORDER — ROCURONIUM BROMIDE 50 MG/5ML IV SOLN
INTRAVENOUS | Status: AC
  Filled 2015-12-16: qty 1

## 2015-12-16 MED ORDER — HYDRALAZINE HCL 20 MG/ML IJ SOLN: 10.0000 mg | INTRAMUSCULAR | Status: DC | PRN

## 2015-12-16 MED ORDER — PROPOFOL 10 MG/ML IV BOLUS
INTRAVENOUS | Status: DC | PRN
  Administered 2015-12-16: 90 mg via INTRAVENOUS

## 2015-12-16 MED ORDER — PROPOFOL 10 MG/ML IV BOLUS
INTRAVENOUS | Status: AC
  Filled 2015-12-16: qty 20

## 2015-12-16 MED ORDER — ROCURONIUM BROMIDE 100 MG/10ML IV SOLN
INTRAVENOUS | Status: DC | PRN
  Administered 2015-12-16 (×2): 10 mg via INTRAVENOUS
  Administered 2015-12-16: 30 mg via INTRAVENOUS

## 2015-12-16 MED ORDER — FENTANYL CITRATE (PF) 100 MCG/2ML IJ SOLN
INTRAMUSCULAR | Status: DC | PRN
  Administered 2015-12-16 (×2): 50 ug via INTRAVENOUS

## 2015-12-16 MED ORDER — BUPIVACAINE HCL 0.25 % IJ SOLN
INTRAMUSCULAR | Status: DC | PRN
Start: 2015-12-16 — End: 2015-12-16
  Administered 2015-12-16: 6 mL

## 2015-12-16 MED ORDER — CIPROFLOXACIN IN D5W 400 MG/200ML IV SOLN
400.0000 mg | Freq: Two times a day (BID) | INTRAVENOUS | Status: DC
  Administered 2015-12-16 – 2015-12-20 (×8): 400 mg via INTRAVENOUS
  Filled 2015-12-16 (×8): qty 200

## 2015-12-16 MED ORDER — DEXTROSE 5 % IV SOLN
10.0000 mg | INTRAVENOUS | Status: DC | PRN
  Administered 2015-12-16: 40 ug/min via INTRAVENOUS

## 2015-12-16 MED ORDER — PHENYLEPHRINE 40 MCG/ML (10ML) SYRINGE FOR IV PUSH (FOR BLOOD PRESSURE SUPPORT)
PREFILLED_SYRINGE | INTRAVENOUS | Status: AC
  Filled 2015-12-16: qty 10

## 2015-12-16 MED ORDER — PANTOPRAZOLE SODIUM 40 MG IV SOLR
40.0000 mg | Freq: Every day | INTRAVENOUS | Status: DC
  Administered 2015-12-16 – 2015-12-18 (×3): 40 mg via INTRAVENOUS
  Filled 2015-12-16 (×3): qty 40

## 2015-12-16 MED ORDER — IOPAMIDOL (ISOVUE-300) INJECTION 61%: INTRAVENOUS | Status: DC | PRN

## 2015-12-16 MED ORDER — POTASSIUM CHLORIDE 10 MEQ/100ML IV SOLN
10.0000 meq | INTRAVENOUS | Status: AC
Start: 2015-12-16 — End: 2015-12-16
  Administered 2015-12-16 (×4): 10 meq via INTRAVENOUS
  Filled 2015-12-16 (×4): qty 100

## 2015-12-16 MED ORDER — ENOXAPARIN SODIUM 40 MG/0.4ML ~~LOC~~ SOLN
40.0000 mg | SUBCUTANEOUS | Status: DC
  Administered 2015-12-17 – 2015-12-20 (×4): 40 mg via SUBCUTANEOUS
  Filled 2015-12-16 (×4): qty 0.4

## 2015-12-16 MED ORDER — ONDANSETRON HCL 4 MG/2ML IJ SOLN
INTRAMUSCULAR | Status: DC | PRN
  Administered 2015-12-16: 4 mg via INTRAVENOUS

## 2015-12-16 MED ORDER — MORPHINE SULFATE (PF) 2 MG/ML IV SOLN: 1.0000 mg | INTRAVENOUS | Status: DC | PRN

## 2015-12-16 MED ORDER — SUGAMMADEX SODIUM 200 MG/2ML IV SOLN
INTRAVENOUS | Status: AC
  Filled 2015-12-16: qty 2

## 2015-12-16 MED ORDER — LACTATED RINGERS IV SOLN: INTRAVENOUS | Status: DC

## 2015-12-16 MED ORDER — LACTATED RINGERS IV SOLN
INTRAVENOUS | Status: DC | PRN
  Administered 2015-12-16 (×2): via INTRAVENOUS

## 2015-12-16 MED ORDER — DEXTROSE-NACL 5-0.9 % IV SOLN
INTRAVENOUS | Status: DC
  Administered 2015-12-16: 1000 mL via INTRAVENOUS
  Administered 2015-12-17 – 2015-12-20 (×6): via INTRAVENOUS

## 2015-12-16 MED ORDER — CHLORHEXIDINE GLUCONATE CLOTH 2 % EX PADS
6.0000 | MEDICATED_PAD | Freq: Every day | CUTANEOUS | Status: DC
  Administered 2015-12-17 – 2015-12-20 (×4): 6 via TOPICAL

## 2015-12-16 MED ORDER — ONDANSETRON 4 MG PO TBDP
4.0000 mg | ORAL_TABLET | Freq: Four times a day (QID) | ORAL | Status: DC | PRN
  Filled 2015-12-16: qty 1

## 2015-12-16 MED ORDER — HEMOSTATIC AGENTS (NO CHARGE) OPTIME
TOPICAL | Status: DC | PRN
  Administered 2015-12-16: 1 via TOPICAL

## 2015-12-16 MED ORDER — WHITE PETROLATUM GEL
Status: AC
  Administered 2015-12-16: 1
  Filled 2015-12-16: qty 1

## 2015-12-16 MED ORDER — IOPAMIDOL (ISOVUE-300) INJECTION 61%
INTRAVENOUS | Status: AC
  Filled 2015-12-16: qty 50

## 2015-12-16 MED ORDER — ONDANSETRON HCL 4 MG/2ML IJ SOLN: 4.0000 mg | Freq: Four times a day (QID) | INTRAMUSCULAR | Status: DC | PRN

## 2015-12-16 MED ORDER — LIDOCAINE HCL (CARDIAC) 20 MG/ML IV SOLN
INTRAVENOUS | Status: DC | PRN
  Administered 2015-12-16: 70 mg via INTRAVENOUS

## 2015-12-16 MED ORDER — MORPHINE SULFATE (PF) 2 MG/ML IV SOLN
1.0000 mg | INTRAVENOUS | Status: DC | PRN
  Administered 2015-12-16 – 2015-12-18 (×7): 2 mg via INTRAVENOUS
  Filled 2015-12-16 (×8): qty 1

## 2015-12-16 MED ORDER — MIDAZOLAM HCL 2 MG/2ML IJ SOLN
INTRAMUSCULAR | Status: AC
  Filled 2015-12-16: qty 2

## 2015-12-16 MED ORDER — SUGAMMADEX SODIUM 200 MG/2ML IV SOLN
INTRAVENOUS | Status: DC | PRN
  Administered 2015-12-16: 200 mg via INTRAVENOUS

## 2015-12-16 MED ORDER — SUCCINYLCHOLINE CHLORIDE 20 MG/ML IJ SOLN
INTRAMUSCULAR | Status: DC | PRN
  Administered 2015-12-16: 90 mg via INTRAVENOUS

## 2015-12-16 MED ORDER — OXYCODONE HCL 5 MG PO TABS
5.0000 mg | ORAL_TABLET | ORAL | Status: DC | PRN
  Administered 2015-12-18: 10 mg via ORAL
  Filled 2015-12-16: qty 2

## 2015-12-16 MED ORDER — LIDOCAINE HCL (CARDIAC) 20 MG/ML IV SOLN
INTRAVENOUS | Status: AC
  Filled 2015-12-16: qty 5

## 2015-12-16 MED ORDER — ONDANSETRON HCL 4 MG/2ML IJ SOLN
INTRAMUSCULAR | Status: AC
  Filled 2015-12-16: qty 2

## 2015-12-16 MED ORDER — MUPIROCIN 2 % EX OINT
TOPICAL_OINTMENT | Freq: Two times a day (BID) | CUTANEOUS | Status: DC
  Administered 2015-12-16 – 2015-12-20 (×9): via NASAL
  Filled 2015-12-16 (×3): qty 22

## 2015-12-16 MED ORDER — SODIUM CHLORIDE 0.9 % IR SOLN
Status: DC | PRN
  Administered 2015-12-16: 1000 mL

## 2015-12-16 SURGICAL SUPPLY — 69 items
APPLIER CLIP 5 13 M/L LIGAMAX5 (MISCELLANEOUS) ×3
BENZOIN TINCTURE PRP APPL 2/3 (GAUZE/BANDAGES/DRESSINGS) ×3 IMPLANT
BINDER ABDOMINAL 10 UNV 27-48 (MISCELLANEOUS) ×3 IMPLANT
CANISTER SUCTION 2500CC (MISCELLANEOUS) ×3 IMPLANT
CHLORAPREP W/TINT 26ML (MISCELLANEOUS) ×3 IMPLANT
CLIP APPLIE 5 13 M/L LIGAMAX5 (MISCELLANEOUS) ×1 IMPLANT
CLIP LIGATING HEMO O LOK GREEN (MISCELLANEOUS) ×3 IMPLANT
CLOSURE WOUND 1/2 X4 (GAUZE/BANDAGES/DRESSINGS) ×1
COVER MAYO STAND STRL (DRAPES) ×3 IMPLANT
COVER SURGICAL LIGHT HANDLE (MISCELLANEOUS) ×3 IMPLANT
COVER TRANSDUCER ULTRASND (DRAPES) IMPLANT
DECANTER SPIKE VIAL GLASS SM (MISCELLANEOUS) ×6 IMPLANT
DEVICE TROCAR PUNCTURE CLOSURE (ENDOMECHANICALS) ×3 IMPLANT
DISSECTOR BLUNT TIP ENDO 5MM (MISCELLANEOUS) ×3 IMPLANT
DRAIN CHANNEL 19F RND (DRAIN) ×3 IMPLANT
DRAPE C-ARM 42X72 X-RAY (DRAPES) ×3 IMPLANT
DRAPE WARM FLUID 44X44 (DRAPE) ×3 IMPLANT
DRESSING ALLEVYN LIFE SACRUM (GAUZE/BANDAGES/DRESSINGS) ×3 IMPLANT
DRSG PAD ABDOMINAL 8X10 ST (GAUZE/BANDAGES/DRESSINGS) ×3 IMPLANT
DRSG VAC ATS MED SENSATRAC (GAUZE/BANDAGES/DRESSINGS) ×3 IMPLANT
ELECT BLADE 4.0 EZ CLEAN MEGAD (MISCELLANEOUS) ×3
ELECT BLADE 6.5 EXT (BLADE) ×3 IMPLANT
ELECT PENCIL ROCKER SW 15FT (MISCELLANEOUS) ×3 IMPLANT
ELECT REM PT RETURN 9FT ADLT (ELECTROSURGICAL) ×3
ELECTRODE BLDE 4.0 EZ CLN MEGD (MISCELLANEOUS) ×1 IMPLANT
ELECTRODE REM PT RTRN 9FT ADLT (ELECTROSURGICAL) ×1 IMPLANT
EVACUATOR SILICONE 100CC (DRAIN) ×3 IMPLANT
GAUZE SPONGE 2X2 8PLY STRL LF (GAUZE/BANDAGES/DRESSINGS) ×1 IMPLANT
GAUZE SPONGE 4X4 12PLY STRL (GAUZE/BANDAGES/DRESSINGS) ×3 IMPLANT
GLOVE BIO SURGEON STRL SZ 6.5 (GLOVE) ×2 IMPLANT
GLOVE BIO SURGEON STRL SZ7.5 (GLOVE) ×3 IMPLANT
GLOVE BIO SURGEONS STRL SZ 6.5 (GLOVE) ×1
GLOVE BIOGEL M 7.0 STRL (GLOVE) ×3 IMPLANT
GLOVE BIOGEL PI IND STRL 7.0 (GLOVE) ×2 IMPLANT
GLOVE BIOGEL PI INDICATOR 7.0 (GLOVE) ×4
GLOVE EUDERMIC 7 POWDERFREE (GLOVE) ×3 IMPLANT
GOWN STRL REUS W/ TWL LRG LVL3 (GOWN DISPOSABLE) ×3 IMPLANT
GOWN STRL REUS W/ TWL XL LVL3 (GOWN DISPOSABLE) IMPLANT
GOWN STRL REUS W/TWL LRG LVL3 (GOWN DISPOSABLE) ×6
GOWN STRL REUS W/TWL XL LVL3 (GOWN DISPOSABLE)
HEMOSTAT SURGICEL 2X14 (HEMOSTASIS) ×3 IMPLANT
IV CATH 14GX2 1/4 (CATHETERS) ×3 IMPLANT
KIT BASIN OR (CUSTOM PROCEDURE TRAY) ×3 IMPLANT
KIT ROOM TURNOVER OR (KITS) ×3 IMPLANT
NEEDLE INSUFFLATION 14GA 120MM (NEEDLE) ×3 IMPLANT
NS IRRIG 1000ML POUR BTL (IV SOLUTION) ×6 IMPLANT
PAD ARMBOARD 7.5X6 YLW CONV (MISCELLANEOUS) ×6 IMPLANT
POUCH SPECIMEN RETRIEVAL 10MM (ENDOMECHANICALS) IMPLANT
SCISSORS LAP 5X35 DISP (ENDOMECHANICALS) ×3 IMPLANT
SET CHOLANGIOGRAPHY FRANKLIN (SET/KITS/TRAYS/PACK) ×3 IMPLANT
SET IRRIG TUBING LAPAROSCOPIC (IRRIGATION / IRRIGATOR) ×3 IMPLANT
SLEEVE ENDOPATH XCEL 5M (ENDOMECHANICALS) ×3 IMPLANT
SPECIMEN JAR SMALL (MISCELLANEOUS) ×3 IMPLANT
SPONGE GAUZE 2X2 STER 10/PKG (GAUZE/BANDAGES/DRESSINGS) ×2
SPONGE LAP 18X18 X RAY DECT (DISPOSABLE) ×3 IMPLANT
STAPLER VISISTAT 35W (STAPLE) ×3 IMPLANT
STRIP CLOSURE SKIN 1/2X4 (GAUZE/BANDAGES/DRESSINGS) ×2 IMPLANT
SUT ETHILON 2 0 FS 18 (SUTURE) ×3 IMPLANT
SUT MNCRL AB 3-0 PS2 18 (SUTURE) ×3 IMPLANT
SUT PDS AB 1 TP1 96 (SUTURE) ×6 IMPLANT
SUT SILK 3 0 SH CR/8 (SUTURE) ×3 IMPLANT
SYRINGE TOOMEY DISP (SYRINGE) ×3 IMPLANT
TOWEL OR 17X24 6PK STRL BLUE (TOWEL DISPOSABLE) ×3 IMPLANT
TOWEL OR 17X26 10 PK STRL BLUE (TOWEL DISPOSABLE) ×3 IMPLANT
TRAY LAPAROSCOPIC MC (CUSTOM PROCEDURE TRAY) ×3 IMPLANT
TROCAR XCEL NON-BLD 11X100MML (ENDOMECHANICALS) ×3 IMPLANT
TROCAR XCEL NON-BLD 5MMX100MML (ENDOMECHANICALS) ×9 IMPLANT
TUBING INSUFFLATION (TUBING) ×3 IMPLANT
YANKAUER SUCT BULB TIP NO VENT (SUCTIONS) ×3 IMPLANT

## 2015-12-16 NOTE — Progress Notes (Addendum)
Patient ID: Isaac Ramirez, male   DOB: 05-22-1951, 65 y.o.   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 In ED, patient was found to have lactate 4.8, critical care has seen the pt in consultation for sepsis. Pt was also seen by surgery. Plan for surgery 4/14.  Assessment/Plan:    Principal Problem:   Acute metabolic encephalopathy - Likely related to acute infectious process as well as history of remote CVA based on MR brain - No acute intracranial abnormality. 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  - Better mental status   Active Problems:   Sepsis ([place]) / Calculus of gallbladder with acute cholecystitis with cystic duct obstruction / Leukocytosis - Sepsis criteria met on admission with tachycardia, tachypnea, leukocytosis and lactic acidosis - Source of infection is cholecystitis - CT abdomen showed cholelithiasis with mild gallbladder wall thickening and possible pericholecystic fluid. Acute cholecystitis cannot be excluded - As of 4/13, sepsis etiology resolved, pt is hemodynamically stable, lactic acid WNL - HID scan showed cystic duct obstruction, cholecystitis  - Plan for surgery today - Continue cipro, flagyl, vnaco     Hypothyroidism - Continue synthroid    PUD (peptic ulcer disease) - Continue Protonix    Down syndrome / Trisomy 21 - Stable     CKD (chronic kidney disease) stage 3, GFR 30-59 ml/min - Baseline creatinine 3 months ago was around 1.5 and on this admission 1.43, at baseline range  - Improving with fluids     Gastric outlet obstruction - On prior admission, stable   DVT  Prophylaxis  - Lovenox subQ ordered while pt in hospital    Code Status: DNR/DNI Family Communication:  plan of care discussed with the patient's mother at the bedside  Disposition Plan: home once cleared by surgery   IV access:  Peripheral IV  Procedures and diagnostic studies:    Nm Hepatobiliary Including Gb 12/15/2015 Cystic duct obstruction - when combined with CT findings consistent with cholecystitis.   Ct Abdomen Pelvis Wo Contrast 12/14/2015 Mild bilateral hydroureteronephrosis secondary to severe urinary bladder distention. No renal or ureteral calculi are noted. Stable small fat containing ventral hernia seen in the pelvis. Cholelithiasis is noted with mild gallbladder wall thickening and possible pericholecystic fluid. Acute cholecystitis cannot be excluded, and HIDA scan may be performed for further evaluation.   Ct Head Wo Contrast 12/14/2015  Minimal diffuse cortical atrophy. Mild chronic ischemic white matter disease. Focal low density seen in the superior portion of the right parietal cortex concerning for infarction of indeterminate age. MRI is recommended for further evaluation.   Mr Brain Wo Contrast 12/14/2015  1. No acute intracranial abnormality. 2. Remote left parietal lobe nonhemorrhagic infarct. 3. Mild atrophy and white matter disease bilaterally extending into the brainstem. 4. Remote lacunar infarcts of the left cerebellum.   Dg Chest Port 1 View 12/14/2015  No active cardiopulmonary disease.    Medical Consultants:  Surgery PCCM  Other Consultants:  None  IAnti-Infectives:   Cipro, flagyl, vanco 12/14/2015 -->   Isaac Lenz, MD  [facility] Pager [phone]  Time spent in minutes: 25 minutes  If 7PM-7AM, please contact night-coverage [email or web address] Password [facility] 12/16/2015, 11:02  AM   LOS: 2 days    HPI/Subjective: No acute overnight events. No reports of pain.  Objective: Filed Vitals:   12/15/15 0540 12/15/15 2246 12/16/15 0515  12/16/15 0819  BP: 142/83 120/69 106/62 117/78  Pulse: 96 99 98 88  Temp: 98.4 F (36.9 C) 98.2 F (36.8 C) 98 F (36.7 C)   TempSrc: Oral     Resp: _0 Height:      Weight:      SpO2: 97% 98% 99%     Intake/Output Summary (Last 24 hours) at 12/16/15 1102 Last data filed at 12/16/15 0655  Gross per 24 hour  Intake      2 ml  Output   1500 ml  Net  -1498 ml    Exam:   General:  Pt is alert, awake, no distress  Cardiovascular: RRR, S1/S2 (+)  Respiratory: No wheezing, no crackles, no rhonchi  Abdomen: (+) BS, non tender   Extremities: No lower extremity swelling, palpable pulses   Neuro: Nonfocal  Data Reviewed: Basic Metabolic Panel:  Recent Labs Lab 12/14/15 1224 12/15/15 0441 12/16/15 0449  NA 137 141 141  K 3.5 3.9 3.0*  CL 98* 107 108  CO2 _1 GLUCOSE 178* 104* 85  BUN _2 CREATININE 1.43* 1.20 1.27*  CALCIUM 8.7* 8.4* 8.3*  MG  --  1.4*  --   PHOS  --  3.0  --    Liver Function Tests:  Recent Labs Lab 12/14/15 1224 12/15/15 0441 12/16/15 0449  AST 27 104* 32  ALT 10* 72* 34  ALKPHOS 61 298* 157*  BILITOT 0.6 0.6 0.7  PROT 6.6 6.1* 5.6*  ALBUMIN 2.7* 2.3* 1.9*    Recent Labs Lab 12/14/15 1611  LIPASE 28  AMYLASE 35   No results for input(s): AMMONIA in the last 168 hours. CBC:  Recent Labs Lab 12/14/15 1224 12/15/15 0441 12/16/15 0449  WBC 13.8* 13.6* 12.2*  NEUTROABS 11.5*  --  10.2*  HGB 11.3* 11.2* 10.0*  HCT 34.0* 35.1* 32.0*  MCV 82.5 82.4 83.6  PLT 270 198 216   Cardiac Enzymes: No results for input(s): CKTOTAL, CKMB, CKMBINDEX, TROPONINI in the last 168 hours. BNP: Invalid input(s): POCBNP CBG: No results for input(s): GLUCAP in the last 168 hours.  Recent Results (from the past 240 hour(s))  Blood Culture (routine x 2)     Status: None (Preliminary result)   Collection Time: 12/14/15  1:09 PM  Result Value Ref Range Status   Specimen Description BLOOD LEFT ANTECUBITAL  Final    Special Requests BOTTLES DRAWN AEROBIC AND ANAEROBIC 5cc  Final   Culture  Setup Time   Final    GRAM POSITIVE COCCI IN CLUSTERS AEROBIC BOTTLE ONLY CRITICAL RESULT CALLED TO, READ BACK BY AND VERIFIED WITH: G GLEASON,RN AT 1416 12/15/15 BY L BENFIELD    Culture GRAM POSITIVE COCCI  Final   Report Status PENDING  Incomplete  Blood Culture (routine x 2)     Status: None (Preliminary result)   Collection Time: 12/14/15  1:19 PM  Result Value Ref Range Status   Specimen Description BLOOD BLOOD RIGHT FOREARM  Final   Special Requests BOTTLES DRAWN AEROBIC AND ANAEROBIC 5CC  Final   Culture NO GROWTH 1 DAY  Final   Report Status PENDING  Incomplete  Urine culture     Status: None (Preliminary result)   Collection Time: 12/14/15  1:38 PM  Result Value Ref Range  Status   Specimen Description URINE, CATHETERIZED  Final   Special Requests NONE  Final   Culture NO GROWTH < 24 HOURS  Final   Report Status PENDING  Incomplete     Scheduled Meds: . [MAR Hold] ciprofloxacin  400 mg Intravenous Q12H  . [MAR Hold] levothyroxine  75 mcg Intravenous QAC breakfast  . [MAR Hold] metronidazole  500 mg Intravenous Q8H  . [MAR Hold] pantoprazole (PROTONIX) IV  40 mg Intravenous Q12H  . [MAR Hold] pneumococcal 23 valent vaccine  0.5 mL Intramuscular Tomorrow-1000  . [MAR Hold] sodium chloride flush  3 mL Intravenous Q12H  . [MAR Hold] sucralfate  1 g Oral QID  . [MAR Hold] tamsulosin  0.4 mg Oral Daily  . [MAR Hold] vancomycin  500 mg Intravenous Q12H   Continuous Infusions: . lactated ringers

## 2015-12-16 NOTE — Op Note (Signed)
12/16/2015  11:49 AM  PATIENT:  Cristopher Estimable Dauphin  65 y.o. male  PRE-OPERATIVE DIAGNOSIS:  ACUTE CHOLECYSTITIS  POST-OPERATIVE DIAGNOSIS: SAME  PROCEDURE:  Procedure(s): LAPAROSCOPIC CHOLECYSTECTOMY CONVERTED TO OPEN CHOLECYSTECTOMY AND DRAIN PLACEMENT (N/A)  SURGEON:  Surgeon(s) and Role:    * Ralene Ok, MD - Primary    * Fanny Skates, MD   ANESTHESIA:   local and general  EBL:  Total I/O In: 1000 [I.V.:1000] Out: 200 [Urine:100; Blood:100]  BLOOD ADMINISTERED:none  DRAINS: (19Fr) Jackson-Pratt drain(s) with closed bulb suction in the gallbladder fossa   LOCAL MEDICATIONS USED:  BUPIVICAINE   SPECIMEN:  Source of Specimen:  gallbladder  DISPOSITION OF SPECIMEN:  PATHOLOGY  COUNTS:  YES  TOURNIQUET:  * No tourniquets in log *  DICTATION: .Dragon Dictation The patient was taken to the operating and placed in the supine position with bilateral SCDs in place. The patient was prepped and draped in the usual sterile fashion. A time out was called and all facts were verified. A pneumoperitoneum was obtained via A Veress needle technique to a pressure of 69mm of mercury.  A 53mm trochar was then placed in the right upper quadrant under visualization, and there were no injuries to any abdominal organs. A 11 mm port was then placed in the umbilical region after infiltrating with local anesthesia under direct visualization. A second and third epigastric port and right lower quadrant port placement under direct visualization, respectively. There was a large amount of adhesions to the gallbladder from the omentum and what appeared to be the transverse colon.  The gallbladder was identified and retracted, the peritoneum and omentum were then sharply dissected from the gallbladder and this dissection was attempted to be carried down to Calot's triangle. Secondary to the adhesions from the surrounding structures it was decided to proceed to open cholecystectomy.    The  periumbilical area had some omentum that was laparoscopically dissected away to assist with entering the abdomen open fashion. Once this was done the previous midline incision was re-incised. Started the umbilicus the fascia was elevated in the abdomen was entered bluntly. At this time there appeared to be some omental adhesions these were taken down with sharp dissection and cautery to maintain hemostasis. At this time a Bookwalter retractor was then placed for retraction.  The gallbladder was grasped and retracted anteriorly which cautery was used to maintain hemostasis and dissection of the gallbladder off the gallbladder fossa of the liver was undertaken. This was taken towards the neck of the gallbladder. There were dense adhesions to the neck of the gallbladder. It was difficult to assess the cystic duct and artery. At this time we decided to come across the neck of the gallbladder sharply. The cystic artery was easily identified. This was suture ligated using a 3-0 silk. We were unable to cannulate the cystic duct. A portion of the common bile duct could be visualized however no cystic duct could be seen. At this time it was decided to abandon the intraoperative cholangiogram. We obtained hemostasis with electrocautery. Surgicel was then placed in the gallbladder fossa bed.   At this time in 65 [place] drain was then passed through a left lower quadrant port site. This was brought up underneath the gallbladder fossa. This was secured to skin using a 2-0 nylon.   At this time a #1 PDS single strand was used to reapproximate the midline fascia in a standard running fashion. The skin was irrigated out with sterile saline. The skin was then  stapled closed. The wound was dressed with a honeycomb dressing.  Patient had the procedure well was taken to the recovery room stable condition.   PLAN OF CARE: Admit to inpatient   PATIENT DISPOSITION:  PACU - hemodynamically stable.   Delay start of  Pharmacological VTE agent (>24hrs) due to surgical blood loss or risk of bleeding: yes

## 2015-12-16 NOTE — Progress Notes (Signed)
Patient going to OR; his sister at bedside. No acute distress noted at this time, no complaints. Report was given to nurse in short stay.

## 2015-12-16 NOTE — Progress Notes (Addendum)
Patient back from OR. NO acute distress noted, no S/S of pain. Abdominal dressing intact, clean, dry; abdominal binder on. JP drain lateral right abdomen - drainage appearance - bloody, small amount. Will continue to monitor.

## 2015-12-16 NOTE — Anesthesia Postprocedure Evaluation (Signed)
Anesthesia Post Note  Patient: [name]  Procedure(s) Performed: Procedure(s) (LRB): LAPAROSCOPIC CHOLECYSTECTOMY CONVERTED TO OPEN CHOLECYSTECTOMY WITH  CHOLANGIOGRAM POSSIBLE OPEN (N/A)  Patient location during evaluation: PACU Anesthesia Type: General Level of consciousness: awake and alert, oriented and patient cooperative Pain management: pain level controlled Vital Signs Assessment: post-procedure vital signs reviewed and stable Respiratory status: spontaneous breathing, nonlabored ventilation and respiratory function stable Cardiovascular status: blood pressure returned to baseline and stable Postop Assessment: no signs of nausea or vomiting Anesthetic complications: no    Last Vitals:  Filed Vitals:   12/16/15 1215 12/16/15 1230  BP: 137/87 142/88  Pulse: 96 97  Temp:    Resp: 16 16    Last Pain:  Filed Vitals:   12/16/15 1238  PainSc: Asleep                 Meral Geissinger,E. Wateen Varon

## 2015-12-16 NOTE — Anesthesia Procedure Notes (Addendum)
Procedure Name: Intubation Date/Time: 12/16/2015 9:54 AM Performed by: Carney Living Pre-anesthesia Checklist: Patient identified, Emergency Drugs available, Suction available, Patient being monitored and Timeout performed Patient Re-evaluated:Patient Re-evaluated prior to inductionOxygen Delivery Method: Circle system utilized Preoxygenation: Pre-oxygenation with 100% oxygen Intubation Type: IV induction Ventilation: Mask ventilation without difficulty Laryngoscope Size: Glidescope Grade View: Grade I Tube type: Oral Tube size: 7.0 mm Number of attempts: 1 Airway Equipment and Method: Stylet Placement Confirmation: ETT inserted through vocal cords under direct vision,  positive ETCO2 and breath sounds checked- equal and bilateral Secured at: 20 cm Tube secured with: Tape Dental Injury: Teeth and Oropharynx as per pre-operative assessment  Comments: Easily intubated utilizing glidescope with smaller sized blade

## 2015-12-16 NOTE — Anesthesia Preprocedure Evaluation (Signed)
Anesthesia Evaluation  Patient identified by MRN, date of birth, ID band Patient confused    Airway Mallampati: III   Neck ROM: Full  Mouth opening: Limited Mouth Opening  Dental  (+) Poor Dentition   Pulmonary neg pulmonary ROS,    breath sounds clear to auscultation       Cardiovascular negative cardio ROS   Rate:Normal + Systolic murmurs    Neuro/Psych Downs syndrome and dementia    GI/Hepatic PUD, Previous abd sufgery   Endo/Other  Hypothyroidism   Renal/GU Renal disease     Musculoskeletal   Abdominal   Peds  Hematology  (+) anemia ,   Anesthesia Other Findings   Reproductive/Obstetrics                             Anesthesia Physical Anesthesia Plan  ASA: III  Anesthesia Plan: General   Post-op Pain Management:    Induction: Intravenous  Airway Management Planned: Video Laryngoscope Planned and Oral ETT  Additional Equipment:   Intra-op Plan:   Post-operative Plan: Extubation in OR and Possible Post-op intubation/ventilation  Informed Consent: I have reviewed the patients History and Physical, chart, labs and discussed the procedure including the risks, benefits and alternatives for the proposed anesthesia with the patient or authorized representative who has indicated his/her understanding and acceptance.   Dental advisory given  Plan Discussed with:   Anesthesia Plan Comments: (Plans discussed c family members )        Anesthesia Quick Evaluation

## 2015-12-16 NOTE — Transfer of Care (Signed)
Immediate Anesthesia Transfer of Care Note  Patient: Isaac Ramirez  Procedure(s) Performed: Procedure(s): LAPAROSCOPIC CHOLECYSTECTOMY CONVERTED TO OPEN CHOLECYSTECTOMY WITH  CHOLANGIOGRAM POSSIBLE OPEN (N/A)  Patient Location: PACU  Anesthesia Type:General  Level of Consciousness: patient cooperative  Airway & Oxygen Therapy: Patient Spontanous Breathing and Patient connected to nasal cannula oxygen  Post-op Assessment: Report given to RN, Post -op Vital signs reviewed and stable and Patient moving all extremities X 4  Post vital signs: Reviewed and stable  Last Vitals:  Filed Vitals:   12/16/15 0819 12/16/15 1200  BP: 117/78 133/85  Pulse: 88 98  Temp:  36.4 C  Resp: 19 17    Complications: No apparent anesthesia complications

## 2015-12-16 NOTE — Progress Notes (Signed)
Subjective: Stable overnight.  No nausea and vomiting. One of his sisters is here.  She spent the night.  Had long conversation with her.  LFTs much better this morning.  Out on phosphatase down to 157.  AST, ALT, and total bilirubin normal.  Objective: Vital signs in last 24 hours: Temp:  [98 F (36.7 C)-98.2 F (36.8 C)] 98 F (36.7 C) (04/14 0515) Pulse Rate:  [98-99] 98 (04/14 0515) Resp:  [18-20] 18 (04/14 0515) BP: (106-120)/(62-69) 106/62 mmHg (04/14 0515) SpO2:  [98 %-99 %] 99 % (04/14 0515) Last BM Date: 12/13/15  Intake/Output from previous day: 04/13 0701 - 04/14 0700 In: 2 [P.O.:2] Out: 1500 [Urine:1500] Intake/Output this shift:    General appearance: Arousal.  Verbalizes.  Answers simple questions. Resp: clear to auscultation bilaterally GI: Soft.  Still tender right upper quadrant.  Not sure I feel a mass or not.  Not distended.  Nontender elsewhere.  Upper midline scar well-healed without obvious hernia  Lab Results:   Recent Labs  12/15/15 0441 12/16/15 0449  WBC 13.6* 12.2*  HGB 11.2* 10.0*  HCT 35.1* 32.0*  PLT 198 216   BMET  Recent Labs  12/15/15 0441 12/16/15 0449  NA 141 141  K 3.9 3.0*  CL 107 108  CO2 25 25  GLUCOSE 104* 85  BUN 11 16  CREATININE 1.20 1.27*  CALCIUM 8.4* 8.3*   PT/INR  Recent Labs  12/14/15 1224  LABPROT 14.5  INR 1.11   ABG No results for input(s): PHART, HCO3 in the last 72 hours.  Invalid input(s): PCO2, PO2  Studies/Results: Ct Abdomen Pelvis Wo Contrast  12/14/2015  CLINICAL DATA:  Lethargy, sepsis. EXAM: CT ABDOMEN AND PELVIS WITHOUT CONTRAST TECHNIQUE: Multidetector CT imaging of the abdomen and pelvis was performed following the standard protocol without IV contrast. COMPARISON:  CT scan of August 18, 2015. FINDINGS: Multilevel degenerative disc disease is noted in the lumbar spine. Visualized lung bases are unremarkable. Multiple gallstones are noted with mild gallbladder wall thickening and  possible pericholecystic fluid. No focal abnormality seen in the liver, spleen or pancreas on these unenhanced images. Adrenal glands are unremarkable. Mild bilateral hydroureteronephrosis is noted secondary to severe urinary bladder distention. No renal or ureteral calculi are noted. The appendix appears normal. There is no evidence of bowel obstruction. Status post gastrojejunostomy. No significant adenopathy is noted. Stable small fat containing ventral hernia is seen anteriorly in the pelvis. IMPRESSION: Mild bilateral hydroureteronephrosis secondary to severe urinary bladder distention. No renal or ureteral calculi are noted. Stable small fat containing ventral hernia seen in the pelvis. Cholelithiasis is noted with mild gallbladder wall thickening and possible pericholecystic fluid. Acute cholecystitis cannot be excluded, and HIDA scan may be performed for further evaluation. Electronically Signed   By: Marijo Conception, M.D.   On: 12/14/2015 16:26   Ct Head Wo Contrast  12/14/2015  CLINICAL DATA:  Altered mental status. EXAM: CT HEAD WITHOUT CONTRAST TECHNIQUE: Contiguous axial images were obtained from the base of the skull through the vertex without intravenous contrast. COMPARISON:  None. FINDINGS: Bony calvarium appears intact. Minimal diffuse cortical atrophy is noted. Mild chronic ischemic white matter disease is noted. Focal low density is seen involving superior portion of right parietal cortex concerning for infarction of indeterminate age. No mass effect midline shift is noted. Ventricular size is within normal limits. There is no evidence of mass lesion or hemorrhage. IMPRESSION: Minimal diffuse cortical atrophy. Mild chronic ischemic white matter disease. Focal low density seen  in the superior portion of the right parietal cortex concerning for infarction of indeterminate age. MRI is recommended for further evaluation. Electronically Signed   By: Marijo Conception, M.D.   On: 12/14/2015 14:18    Mr Brain Wo Contrast  12/14/2015  CLINICAL DATA:  Altered mental status. Decreased responsiveness. Abnormal CT scan. EXAM: MRI HEAD WITHOUT CONTRAST TECHNIQUE: Multiplanar, multiecho pulse sequences of the brain and surrounding structures were obtained without intravenous contrast. COMPARISON:  CT head without contrast from the same day. FINDINGS: A remote nonhemorrhagic infarct is present within the left parietal lobe, corresponding to the CT finding. No acute infarct, hemorrhage, or mass lesion is present. Mild generalized atrophy is present. There is more prominent posterior white matter loss with ex vacuo dilation of the lateral ventricles bilaterally. Remote lacunar infarcts are present in the left basal ganglia. The internal auditory canals are within normal limits bilaterally. White matter changes extend into the brainstem. The cerebellum is unremarkable. Flow is present in the major intracranial arteries. Bilateral lens replacements are present. Globes and orbits are otherwise within normal limits. The paranasal sinuses are clear. The frontal sinuses are not pneumatized. The a left mastoid effusion is present. No obstructing nasopharyngeal lesion is evident. IMPRESSION: 1. No acute intracranial abnormality. 2. Remote left parietal lobe nonhemorrhagic infarct. 3. Mild atrophy and white matter disease bilaterally extending into the brainstem. 4. Remote lacunar infarcts of the left cerebellum. Electronically Signed   By: San Morelle M.D.   On: 12/14/2015 17:56   Nm Hepatobiliary Including Gb  12/15/2015  CLINICAL DATA:  Cholecystitis. EXAM: NUCLEAR MEDICINE HEPATOBILIARY IMAGING TECHNIQUE: Sequential images of the abdomen were obtained out to 60 minutes following intravenous administration of radiopharmaceutical. RADIOPHARMACEUTICALS:  Five mCi Tc-44m  Choletec IV COMPARISON:  CT from yesterday FINDINGS: There is prompt uptake into the liver. No hypervascular rim sign seen. The biliary tree is  seen by 15 minutes and bowel by 25 minutes. The gallbladder was not seen in the first hour. Morphine was not administered and the patient was returned to the floor but brought back for delayed imaging. On the 3 hour delayed image the gallbladder is still not seen. There has been progression of tracer through the bowel. These results will be called to the ordering clinician or representative by the Radiologist Assistant, and communication documented in the PACS or zVision Dashboard. IMPRESSION: Cystic duct obstruction - when combined with CT findings consistent with cholecystitis. Electronically Signed   By: Monte Fantasia M.D.   On: 12/15/2015 15:29   Dg Chest Port 1 View  12/14/2015  CLINICAL DATA:  Altered mental status EXAM: PORTABLE CHEST 1 VIEW COMPARISON:  08/17/2015 FINDINGS: Heart and mediastinal contours are within normal limits. No focal opacities or effusions. No acute bony abnormality. Degenerative changes in the shoulders and thoracic spine. IMPRESSION: No active cardiopulmonary disease. Electronically Signed   By: Rolm Baptise M.D.   On: 12/14/2015 13:37    Anti-infectives: Anti-infectives    Start     Dose/Rate Route Frequency Ordered Stop   12/16/15 1300  levofloxacin (LEVAQUIN) IVPB 750 mg  Status:  Discontinued     750 mg 100 mL/hr over 90 Minutes Intravenous Every 48 hours 12/14/15 1333 12/14/15 2219   12/15/15 1445  vancomycin (VANCOCIN) 500 mg in sodium chloride 0.9 % 100 mL IVPB     500 mg 100 mL/hr over 60 Minutes Intravenous Every 12 hours 12/15/15 1430     12/15/15 1300  vancomycin (VANCOCIN) IVPB 750 mg/150 ml premix  Status:  Discontinued     750 mg 150 mL/hr over 60 Minutes Intravenous Every 24 hours 12/14/15 1333 12/14/15 2221   12/15/15 1300  ciprofloxacin (CIPRO) IVPB 400 mg     400 mg 200 mL/hr over 60 Minutes Intravenous Every 12 hours 12/14/15 2221     12/14/15 2100  aztreonam (AZACTAM) 1 g in dextrose 5 % 50 mL IVPB  Status:  Discontinued     1 g 100 mL/hr  over 30 Minutes Intravenous 3 times per day 12/14/15 1333 12/14/15 1437   12/14/15 1445  metroNIDAZOLE (FLAGYL) IVPB 500 mg     500 mg 100 mL/hr over 60 Minutes Intravenous Every 8 hours 12/14/15 1437     12/14/15 1300  levofloxacin (LEVAQUIN) IVPB 750 mg  Status:  Discontinued     750 mg 100 mL/hr over 90 Minutes Intravenous  Once 12/14/15 1253 12/14/15 1437   12/14/15 1300  aztreonam (AZACTAM) 2 g in dextrose 5 % 50 mL IVPB     2 g 100 mL/hr over 30 Minutes Intravenous  Once 12/14/15 1253 12/14/15 1355   12/14/15 1300  vancomycin (VANCOCIN) IVPB 1000 mg/200 mL premix     1,000 mg 200 mL/hr over 60 Minutes Intravenous  Once 12/14/15 1253 12/14/15 1425      Assessment/Plan:  Recurrent acute cholecystitis with cholelithiasis. With improvement in LFTs, probability of CBD stones is less, although not impossible Do not think MRCP would be helpful Plan laparoscopic cholecystectomy with cholangiogram today.  Increased risk for conversion to open.  I've asked OR staff to get CBD instrument set available just in case. Long talk with his sister once again about indications, details, techniques, and numerous risk of the surgery.  She fully understands this.  She fully expects [place] to go ahead with surgery today.  They accept all of the risk, understand the risks, and all their questions are answered. Currently on Cipro, Flagyl, vancomycin.  Not sure he needs all of this for gallbladder.  We'll try to narrow antibiotic spectrum postop.  Remote history antrectomy, retrocolic Roux-en-Y gastrojejunostomy for ulcer disease Continue Carafate and PPIs postop Down syndrome Acute encephalopathy, improving   LOS: 2 days    Dominique Ressel M 12/16/2015

## 2015-12-17 LAB — COMPREHENSIVE METABOLIC PANEL
ALBUMIN: 1.6 g/dL — AB (ref 3.5–5.0)
ALT: 21 U/L (ref 17–63)
ANION GAP: 7 (ref 5–15)
AST: 24 U/L (ref 15–41)
Alkaline Phosphatase: 97 U/L (ref 38–126)
BILIRUBIN TOTAL: 0.4 mg/dL (ref 0.3–1.2)
BUN: 11 mg/dL (ref 6–20)
CO2: 26 mmol/L (ref 22–32)
Calcium: 7.8 mg/dL — ABNORMAL LOW (ref 8.9–10.3)
Chloride: 109 mmol/L (ref 101–111)
Creatinine, Ser: 1.25 mg/dL — ABNORMAL HIGH (ref 0.61–1.24)
GFR calc Af Amer: >60 mL/min (ref >60)
GFR, EST NON AFRICAN AMERICAN: 60 mL/min — AB (ref >60)
Glucose, Bld: 124 mg/dL — ABNORMAL HIGH (ref 65–99)
POTASSIUM: 3.5 mmol/L (ref 3.5–5.1)
Sodium: 142 mmol/L (ref 135–145)
TOTAL PROTEIN: 4.7 g/dL — AB (ref 6.5–8.1)

## 2015-12-17 LAB — CULTURE, BLOOD (ROUTINE X 2)

## 2015-12-17 LAB — CBC
HEMATOCRIT: 28.8 % — AB (ref 39.0–52.0)
HEMOGLOBIN: 9 g/dL — AB (ref 13.0–17.0)
MCH: 26.2 pg (ref 26.0–34.0)
MCHC: 31.3 g/dL (ref 30.0–36.0)
MCV: 84 fL (ref 78.0–100.0)
Platelets: 225 10*3/uL (ref 150–400)
RBC: 3.43 MIL/uL — AB (ref 4.22–5.81)
RDW: 17.3 % — ABNORMAL HIGH (ref 11.5–15.5)
WBC: 12.2 10*3/uL — AB (ref 4.0–10.5)

## 2015-12-17 MED ORDER — WHITE PETROLATUM GEL
Status: AC
  Administered 2015-12-17: 02:00:00
  Filled 2015-12-17: qty 1

## 2015-12-17 NOTE — Progress Notes (Addendum)
1 Day Post-Op  Subjective: Arousable.  Alert.  Stable Doesn't appear to be any distress.  Family member at bedside. No nausea or vomiting. Temp 100.7,  Now 98.3 Foley in place.  Good urine output. JP drainage 70 mL overnight.  Watery, dark red wine colored.  I don't see any bile.  Objective: Vital signs in last 24 hours: Temp:  [97.5 F (36.4 C)-100.7 F (38.2 C)] 98.3 F (36.8 C) (04/15 0602) Pulse Rate:  [87-103] 87 (04/15 0602) Resp:  [16-19] 18 (04/15 0602) BP: (117-142)/(74-88) 121/74 mmHg (04/15 0602) SpO2:  [96 %-100 %] 96 % (04/15 0602) Last BM Date: 12/13/15  Intake/Output from previous day: 04/14 0701 - 04/15 0700 In: 2101.7 [I.V.:2101.7] Out: 1220 [Urine:1050; Drains:70; Blood:100] Intake/Output this shift: Total I/O In: -  Out: 400 [Urine:400]  General appearance: Alert.  Does not appear to be grimacing or any pain.  Obviously poor historian Resp: Clear anteriorly.  Diminished breast as bases but no rhonchi or wheeze. GI: Soft.  Nondistended.  JP drainage wine colored.  Wound clean and dry.  Minimal appropriate tenderness.  Lab Results:  Results for orders placed or performed during the hospital encounter of 12/14/15 (from the past 24 hour(s))  Surgical pcr screen     Status: Abnormal   Collection Time: 12/16/15  8:10 AM  Result Value Ref Range   MRSA, PCR NEGATIVE NEGATIVE   Staphylococcus aureus POSITIVE (A) NEGATIVE     Studies/Results: No results found.  . Chlorhexidine Gluconate Cloth  6 each Topical Q0600  . ciprofloxacin  400 mg Intravenous Q12H  . enoxaparin (LOVENOX) injection  40 mg Subcutaneous Q24H  . levothyroxine  75 mcg Intravenous QAC breakfast  . metronidazole  500 mg Intravenous Q8H  . mupirocin ointment   Nasal BID  . pantoprazole (PROTONIX) IV  40 mg Intravenous QHS  . pneumococcal 23 valent vaccine  0.5 mL Intramuscular Tomorrow-1000  . sodium chloride flush  3 mL Intravenous Q12H  . sucralfate  1 g Oral QID  . tamsulosin  0.4  mg Oral Daily  . vancomycin  500 mg Intravenous Q12H     Assessment/Plan: s/p Procedure(s): LAPAROSCOPIC CHOLECYSTECTOMY CONVERTED TO OPEN CHOLECYSTECTOMY WITH  CHOLANGIOGRAM POSSIBLE OPEN  POD #1.  Open cholecystectomy - 12/16/2015 - Dr. Rosendo Gros. Severe acute and chronic cholecystitis. Unable to do cholangiogram Lab work drawn but results not out yet.  Will check later Okay to mobilize from surgical standpoint. Suspect low-grade fever due to atelectasis Advance diet stepwise as tolerated.  Continue antibiotics, but no surgical indication for vancomycin.  Cipro and Flagyl are okay in short-term.  Remote history antrectomy, retrocolic Roux-en-Y gastrojejunostomy for ulcer disease Continue Carafate and PPIs postop Down syndrome Acute encephalopathy, improving VTE prophylaxis.  On Lovenox.  @PROBHOSP @  LOS: 3 days    Adrijana Haros M 12/17/2015  . .prob

## 2015-12-17 NOTE — Progress Notes (Signed)
Placed patient on CPAP for the night via auto-mode with minimum pressure of 5cm and maximum pressure set at 20cm

## 2015-12-17 NOTE — Progress Notes (Signed)
Patient ID: Isaac Ramirez, male   DOB: 1950-12-23, 65 y.o.   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 In ED, patient was found to have lactate 4.8, critical care has seen the pt in consultation for sepsis. Pt was also seen by surgery. Pt is s/p open cholecystectomy by Dr. Dalbert Batman 4/14.  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   Active Problems:   Sepsis ([place]) / Calculus of gallbladder with acute cholecystitis with cystic duct obstruction / Leukocytosis - Sepsis criteria met on admission with tachycardia, tachypnea, leukocytosis and lactic acidosis - Source of infection is cholecystitis as evidence on CT abdomen  - CT abdomen showed cholelithiasis with mild gallbladder wall thickening and possible pericholecystic fluid. Acute cholecystitis cannot be excluded - HIDA scan showed cystic duct obstruction, cholecystitis  - S/P open cholecystectomy by Dr. Dalbert Batman 12/16/2015 - As of 4/13, sepsis etiology resolved, pt is hemodynamically stable, lactic acid WNL - Continue cipro, flagyl - Stop vanco today     Hypothyroidism - Continue synthroid    PUD (peptic ulcer disease) - Continue Protonix    Down syndrome / Trisomy 21 - Stable     CKD (chronic kidney disease) stage 3, GFR 30-59 ml/min - Baseline creatinine 3 months ago was around 1.5 and on this admission 1.43, at baseline range  - Improving with fluids,  Cr 1.27 --> 1.25    Gastric outlet obstruction - On prior admission, stable   DVT prophylaxis:  Lovenox subQ  Code Status: DNR/DNI Family Communication: plan of care discussed with the patient's mother at the bedside  Disposition Plan: home once cleared by surgery    Consultants:   Surgery   CCM  Procedures:   Open cholecystectomy  Antimicrobials:   Cipro, flagyl 12/14/2015 -->   Vanco 12/14/2015 --> 12/17/2015    Subjective: No report of resp distress. No vomiting. No overnight events.   Objective: Filed Vitals:   12/16/15 2227 12/17/15 0211 12/17/15 0233 12/17/15 0602  BP: 130/74   121/74  Pulse: 103   87  Temp: 99.7 F (37.6 C) 99.6 F (37.6 C) 100.7 F (38.2 C) 98.3 F (36.8 C)  TempSrc: Oral Axillary Rectal   Resp: 18   18  Height:      Weight:      SpO2: 99%   96%    Intake/Output Summary (Last 24 hours) at 12/17/15 1251 Last data filed at 12/17/15 1144  Gross per 24 hour  Intake 1901.67 ml  Output   1365 ml  Net 536.67 ml   Filed Weights   12/14/15 1213 12/14/15 2108  Weight: 48.988 kg (108 lb) 51.6 kg (113 lb 12.1 oz)    Examination:  General exam: Appears calm and comfortable  Respiratory system: Clear to auscultation. Respiratory effort normal. Cardiovascular system: S1 & S2 heard, RRR. No JVD, murmurs, rubs, gallops or clicks. No pedal edema. Gastrointestinal system: Abdomen is nondistended, soft and nontender. No organomegaly or masses  felt. Normal bowel sounds heard. Central nervous system: Alert. No focal neurological deficits. Extremities: Symmetric 5 x 5 power. Skin: No rashes, lesions or ulcers Psychiatry: Judgement and insight appear normal. Mood & affect appropriate.   Data Reviewed: I have personally reviewed following labs and imaging studies  CBC:  Recent Labs Lab 12/14/15 1224 12/15/15 0441 12/16/15 0449 12/17/15 0533  WBC 13.8* 13.6* 12.2* 12.2*  NEUTROABS 11.5*  --  10.2*  --   HGB 11.3* 11.2* 10.0* 9.0*  HCT  34.0* 35.1* 32.0* 28.8*  MCV 82.5 82.4 83.6 84.0  PLT 270 198 216 982   Basic Metabolic Panel:  Recent Labs Lab 12/14/15 1224 12/15/15 0441 12/16/15 0449 12/17/15 0533  NA 137 141 141 142  K 3.5 3.9 3.0* 3.5  CL 98* 107 108 109  CO2 _0 GLUCOSE 178* 104* 85 124*  BUN _1 CREATININE 1.43* 1.20 1.27* 1.25*  CALCIUM 8.7* 8.4* 8.3* 7.8*  MG  --  1.4*  --   --   PHOS  --  3.0  --   --    GFR: Estimated Creatinine Clearance: 42.8 mL/min (by C-G formula based on Cr of 1.25). Liver Function Tests:  Recent Labs Lab 12/14/15 1224 12/15/15 0441 12/16/15 0449 12/17/15 0533  AST 27 104* 32 24  ALT 10* 72* 34 21  ALKPHOS 61 298* 157* 97  BILITOT 0.6 0.6 0.7 0.4  PROT 6.6 6.1* 5.6* 4.7*  ALBUMIN 2.7* 2.3* 1.9* 1.6*    Recent Labs Lab 12/14/15 1611  LIPASE 28  AMYLASE 35   No results for input(s): AMMONIA in the last 168 hours. Coagulation Profile:  Recent Labs Lab 12/14/15 1224  INR 1.11   Cardiac Enzymes: No results for input(s): CKTOTAL, CKMB, CKMBINDEX, TROPONINI in the last 168 hours. BNP (last 3 results) No results for input(s): PROBNP in the last 8760 hours. HbA1C:  Recent Labs  12/15/15 0441  HGBA1C 5.6   CBG: No results for input(s): GLUCAP in the last 168 hours. Lipid Profile: No results for input(s): CHOL, HDL, LDLCALC, TRIG, CHOLHDL, LDLDIRECT in the last 72 hours. Thyroid Function Tests:  Recent Labs  12/15/15 0441  TSH 0.676   Anemia Panel: No results for input(s): VITAMINB12, FOLATE, FERRITIN, TIBC, IRON, RETICCTPCT in the last 72 hours. Urine analysis:    Component Value Date/Time   COLORURINE YELLOW 12/14/2015 [place] 12/14/2015 1338   LABSPEC 1.017 12/14/2015 1338   PHURINE 6.5 12/14/2015 1338   GLUCOSEU NEGATIVE 12/14/2015 1338   HGBUR MODERATE* 12/14/2015 1338   BILIRUBINUR NEGATIVE 12/14/2015 1338   KETONESUR NEGATIVE 12/14/2015 1338   PROTEINUR >300* 12/14/2015 1338   NITRITE NEGATIVE  12/14/2015 1338   LEUKOCYTESUR NEGATIVE 12/14/2015 1338   Sepsis Labs: _2 (procalcitonin:4,lacticidven:4)  ) Recent Results (from the past 240 hour(s))  Blood Culture (routine x 2)     Status: Abnormal   Collection Time: 12/14/15  1:09 PM  Result Value Ref Range Status   Specimen Description BLOOD LEFT ANTECUBITAL  Final   Special Requests BOTTLES DRAWN AEROBIC AND ANAEROBIC 5cc  Final   Culture  Setup Time   Final    GRAM POSITIVE COCCI IN CLUSTERS AEROBIC BOTTLE ONLY CRITICAL RESULT CALLED TO, READ BACK BY AND VERIFIED WITH: G GLEASON,RN AT 1416 12/15/15 BY L BENFIELD    Culture (A)  Final    STAPHYLOCOCCUS SPECIES (COAGULASE NEGATIVE) THE SIGNIFICANCE OF ISOLATING THIS ORGANISM FROM A SINGLE SET OF BLOOD CULTURES WHEN  MULTIPLE SETS ARE DRAWN IS UNCERTAIN. PLEASE NOTIFY THE MICROBIOLOGY DEPARTMENT WITHIN ONE WEEK IF SPECIATION AND SENSITIVITIES ARE REQUIRED.    Report Status 12/17/2015 FINAL  Final  Blood Culture (routine x 2)     Status: None (Preliminary result)   Collection Time: 12/14/15  1:19 PM  Result Value Ref Range Status   Specimen Description BLOOD BLOOD RIGHT FOREARM  Final   Special Requests BOTTLES DRAWN AEROBIC AND ANAEROBIC 5CC  Final   Culture NO GROWTH 2 DAYS  Final   Report Status PENDING  Incomplete  Urine culture     Status: None   Collection Time: 12/14/15  1:38 PM  Result Value Ref Range Status   Specimen Description URINE, CATHETERIZED  Final   Special Requests NONE  Final   Culture NO GROWTH 2 DAYS  Final   Report Status 12/16/2015 FINAL  Final  Surgical pcr screen     Status: Abnormal   Collection Time: 12/16/15  8:10 AM  Result Value Ref Range Status   MRSA, PCR NEGATIVE NEGATIVE Final   Staphylococcus aureus POSITIVE (A) NEGATIVE Final    Comment:        The Xpert SA Assay (FDA approved for NASAL specimens in patients over 80 years of age), is one component of a comprehensive surveillance program.  Test performance has been  validated by [facility] for patients greater than or equal to 57 year old. It is not intended to diagnose infection nor to guide or monitor treatment.       Radiology Studies: Nm Hepatobiliary Including Gb 12/15/2015 Cystic duct obstruction - when combined with CT findings consistent with cholecystitis.   Ct Abdomen Pelvis Wo Contrast 12/14/2015 Mild bilateral hydroureteronephrosis secondary to severe urinary bladder distention. No renal or ureteral calculi are noted. Stable small fat containing ventral hernia seen in the pelvis. Cholelithiasis is noted with mild gallbladder wall thickening and possible pericholecystic fluid. Acute cholecystitis cannot be excluded, and HIDA scan may be performed for further evaluation.   Ct Head Wo Contrast 12/14/2015  Minimal diffuse cortical atrophy. Mild chronic ischemic white matter disease. Focal low density seen in the superior portion of the right parietal cortex concerning for infarction of indeterminate age. MRI is recommended for further evaluation.   Mr Brain Wo Contrast 12/14/2015  1. No acute intracranial abnormality. 2. Remote left parietal lobe nonhemorrhagic infarct. 3. Mild atrophy and white matter disease bilaterally extending into the brainstem. 4. Remote lacunar infarcts of the left cerebellum.   Dg Chest Port 1 View 12/14/2015  No active cardiopulmonary disease.    Scheduled Meds: . Chlorhexidine Gluconate Cloth  6 each Topical Q0600  . ciprofloxacin  400 mg Intravenous Q12H  . enoxaparin (LOVENOX) injection  40 mg Subcutaneous Q24H  . levothyroxine  75 mcg Intravenous QAC breakfast  . metronidazole  500 mg Intravenous Q8H  . mupirocin ointment   Nasal BID  . pantoprazole (PROTONIX) IV  40 mg Intravenous QHS  . pneumococcal 23 valent vaccine  0.5 mL Intramuscular Tomorrow-1000  . sodium chloride flush  3 mL Intravenous Q12H  . sucralfate  1 g Oral QID  . tamsulosin  0.4 mg Oral Daily   Continuous Infusions: . dextrose 5 % and  0.9% NaCl 100 mL/hr at 12/17/15 0238  . lactated ringers       LOS: 3 days    Time spent: 25 minutes    Leisa Lenz, MD [facility] Pager [phone]  If 7PM-7AM, please contact night-coverage [email or web address] Password [facility] 12/17/2015, 12:51  PM      

## 2015-12-18 NOTE — Progress Notes (Signed)
Foley catheter removed at 11:30pm. By 5:30 pm, no urine output despite good PO intake of fluids. Bladder scan showed 0 mL. MD notified and order received to monitor output and do in and out cath at 6:30. In and out cath produced 200 mL urine. Will continue to monitor output.

## 2015-12-18 NOTE — Progress Notes (Signed)
Patient ID: Isaac Ramirez, male   DOB: 25-Mar-1951, 65 y.o.   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 In ED, patient was found to have lactate 4.8, critical care has seen the pt in consultation for sepsis. Pt was also seen by surgery. Pt is s/p open cholecystectomy by Dr. Dalbert Batman 4/14.  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 - Stable   Active Problems:   Sepsis ([place]) / Calculus of gallbladder with acute cholecystitis with cystic duct obstruction / Leukocytosis - Sepsis criteria met on admission with tachycardia, tachypnea, leukocytosis and lactic acidosis - Source of infection is cholecystitis - CT abdomen showed cholelithiasis with mild gallbladder wall thickening and possible pericholecystic fluid. Acute cholecystitis cannot be excluded - HIDA scan showed cystic duct obstruction, cholecystitis  - S/P open cholecystectomy by Dr. Dalbert Batman 12/16/2015 - As of 4/13, sepsis etiology resolved, pt is hemodynamically stable, lactic acid WNL - Continue cipro, flagyl - Stopped vanco 4/15    Hypothyroidism - Continue synthroid    PUD (peptic ulcer disease) - Continue Protonix    Down syndrome / Trisomy 21 - Stable     CKD (chronic kidney disease) stage 3, GFR 30-59 ml/min - Baseline creatinine 3 months ago was around 1.5 and on this admission 1.43, at baseline range  - Improving    Gastric outlet  obstruction - On prior admission, stable   DVT prophylaxis:  Lovenox subQ  Code Status: DNR/DNI Family Communication: plan of care discussed with the patient's mother at the bedside  Disposition Plan: home once cleared by surgery    Consultants:   Surgery   CCM  Procedures:   Open cholecystectomy  Antimicrobials:   Cipro, flagyl 12/14/2015 -->   Vanco 12/14/2015 --> 12/17/2015    Subjective: No report of resp distress. No vomiting. No overnight events.   Objective: Filed Vitals:   12/17/15 0602 12/17/15 1329 12/17/15 2305 12/18/15 0418  BP: 121/74 127/79  119/74  Pulse: 87 92 88 98  Temp: 98.3 F (36.8 C) 98.9 F (37.2 C)  97.5 F (36.4 C)  TempSrc:    Oral  Resp: 18 18 18 14   Height:      Weight:      SpO2: 96% 95%  91%    Intake/Output Summary (Last 24 hours) at 12/18/15 1447 Last data filed at 12/18/15 1124  Gross per 24 hour  Intake   3040 ml  Output   1425 ml  Net   1615 ml   Filed Weights   12/14/15 1213 12/14/15 2108  Weight: 48.988 kg (108 lb) 51.6 kg (113 lb 12.1 oz)    Examination:  General exam: Appears comfortable, no distress Respiratory system: no wheezing, no rhonchi Cardiovascular system: S1 & S2 (+), Rate controlled . No leg swelling.  Gastrointestinal system: (+) BS, non tender, non distended Central nervous system: No focal deficits, alert Extremities: Strength 5/5, no cyanosis  Skin: warm ,dry Psychiatry: Normal mood, normal behavior.  Data Reviewed: I have personally reviewed following labs and imaging studies  CBC:  Recent Labs Lab 12/14/15 1224 12/15/15 0441 12/16/15 0449 12/17/15 0533  WBC 13.8* 13.6* 12.2* 12.2*  NEUTROABS 11.5*  --  10.2*  --   HGB 11.3* 11.2* 10.0* 9.0*  HCT 34.0* 35.1* 32.0* 28.8*  MCV 82.5 82.4 83.6 84.0  PLT 270 198 216 621   Basic Metabolic Panel:  Recent Labs Lab 12/14/15 1224 12/15/15 0441 12/16/15 0449 12/17/15 0533  NA 137 141 141 142  K 3.5 3.9 3.0* 3.5  CL 98* 107 108  109  CO2 25 25 25 26   GLUCOSE 178* 104* 85 124*  BUN 14 11 16 11   CREATININE 1.43* 1.20 1.27* 1.25*  CALCIUM 8.7* 8.4* 8.3* 7.8*  MG  --  1.4*  --   --   PHOS  --  3.0  --   --    GFR: Estimated Creatinine Clearance: 42.8 mL/min (by C-G formula based on Cr of 1.25). Liver Function Tests:  Recent Labs Lab 12/14/15 1224 12/15/15 0441 12/16/15 0449 12/17/15 0533  AST 27 104* 32 24  ALT 10* 72* 34 21  ALKPHOS 61 298* 157* 97  BILITOT 0.6 0.6 0.7 0.4  PROT 6.6 6.1* 5.6* 4.7*  ALBUMIN 2.7* 2.3* 1.9* 1.6*    Recent Labs Lab 12/14/15 1611  LIPASE 28  AMYLASE 35   No results for input(s): AMMONIA in the last 168 hours. Coagulation Profile:  Recent Labs Lab 12/14/15 1224  INR 1.11   Cardiac Enzymes: No results for input(s): CKTOTAL, CKMB, CKMBINDEX, TROPONINI in the last 168 hours. BNP (last 3 results) No results for input(s): PROBNP in the last 8760 hours. HbA1C: No results for input(s): HGBA1C in the last 72 hours. CBG: No results for input(s): GLUCAP in the last 168 hours. Lipid Profile: No results for input(s): CHOL, HDL, LDLCALC, TRIG, CHOLHDL, LDLDIRECT in the last 72 hours. Thyroid Function Tests: No results for input(s): TSH, T4TOTAL, FREET4, T3FREE, THYROIDAB in the last 72 hours. Anemia Panel: No results for input(s): VITAMINB12, FOLATE, FERRITIN, TIBC, IRON, RETICCTPCT in the last 72 hours. Urine analysis:    Component Value Date/Time   COLORURINE YELLOW 12/14/2015 [place] 12/14/2015 1338   LABSPEC 1.017 12/14/2015 1338   PHURINE 6.5 12/14/2015 1338   GLUCOSEU NEGATIVE 12/14/2015 1338   HGBUR MODERATE* 12/14/2015 1338   BILIRUBINUR NEGATIVE 12/14/2015 1338   KETONESUR NEGATIVE 12/14/2015 1338   PROTEINUR >300* 12/14/2015 1338   NITRITE NEGATIVE 12/14/2015 1338   LEUKOCYTESUR NEGATIVE 12/14/2015 1338   Sepsis Labs: @LABRCNTIP (procalcitonin:4,lacticidven:4)  ) Recent Results (from the past 240 hour(s))  Blood Culture (routine  x 2)     Status: Abnormal   Collection Time: 12/14/15  1:09 PM  Result Value Ref Range Status   Specimen Description BLOOD LEFT ANTECUBITAL  Final   Special Requests BOTTLES DRAWN AEROBIC AND ANAEROBIC 5cc  Final   Culture  Setup Time   Final    GRAM POSITIVE COCCI IN CLUSTERS AEROBIC BOTTLE ONLY CRITICAL RESULT CALLED TO, READ BACK BY AND VERIFIED WITH: G GLEASON,RN AT 1416 12/15/15 BY L BENFIELD    Culture (A)  Final    STAPHYLOCOCCUS SPECIES (COAGULASE NEGATIVE) THE SIGNIFICANCE OF ISOLATING THIS ORGANISM FROM A SINGLE SET OF BLOOD CULTURES WHEN MULTIPLE SETS ARE DRAWN IS UNCERTAIN. PLEASE NOTIFY THE MICROBIOLOGY DEPARTMENT WITHIN ONE WEEK IF SPECIATION AND SENSITIVITIES ARE REQUIRED.    Report Status 12/17/2015 FINAL  Final  Blood Culture (routine x 2)  Status: None (Preliminary result)   Collection Time: 12/14/15  1:19 PM  Result Value Ref Range Status   Specimen Description BLOOD BLOOD RIGHT FOREARM  Final   Special Requests BOTTLES DRAWN AEROBIC AND ANAEROBIC 5CC  Final   Culture NO GROWTH 3 DAYS  Final   Report Status PENDING  Incomplete  Urine culture     Status: None   Collection Time: 12/14/15  1:38 PM  Result Value Ref Range Status   Specimen Description URINE, CATHETERIZED  Final   Special Requests NONE  Final   Culture NO GROWTH 2 DAYS  Final   Report Status 12/16/2015 FINAL  Final  Surgical pcr screen     Status: Abnormal   Collection Time: 12/16/15  8:10 AM  Result Value Ref Range Status   MRSA, PCR NEGATIVE NEGATIVE Final   Staphylococcus aureus POSITIVE (A) NEGATIVE Final    Comment:        The Xpert SA Assay (FDA approved for NASAL specimens in patients over 17 years of age), is one component of a comprehensive surveillance program.  Test performance has been validated by [facility] for patients greater than or equal to 62 year old. It is not intended to diagnose infection nor to guide or monitor treatment.       Radiology Studies: Nm  Hepatobiliary Including Gb 12/15/2015 Cystic duct obstruction - when combined with CT findings consistent with cholecystitis.   Ct Abdomen Pelvis Wo Contrast 12/14/2015 Mild bilateral hydroureteronephrosis secondary to severe urinary bladder distention. No renal or ureteral calculi are noted. Stable small fat containing ventral hernia seen in the pelvis. Cholelithiasis is noted with mild gallbladder wall thickening and possible pericholecystic fluid. Acute cholecystitis cannot be excluded, and HIDA scan may be performed for further evaluation.   Ct Head Wo Contrast 12/14/2015  Minimal diffuse cortical atrophy. Mild chronic ischemic white matter disease. Focal low density seen in the superior portion of the right parietal cortex concerning for infarction of indeterminate age. MRI is recommended for further evaluation.   Mr Brain Wo Contrast 12/14/2015  1. No acute intracranial abnormality. 2. Remote left parietal lobe nonhemorrhagic infarct. 3. Mild atrophy and white matter disease bilaterally extending into the brainstem. 4. Remote lacunar infarcts of the left cerebellum.   Dg Chest Port 1 View 12/14/2015  No active cardiopulmonary disease.    Scheduled Meds: . Chlorhexidine Gluconate Cloth  6 each Topical Q0600  . ciprofloxacin  400 mg Intravenous Q12H  . enoxaparin (LOVENOX) injection  40 mg Subcutaneous Q24H  . levothyroxine  75 mcg Intravenous QAC breakfast  . metronidazole  500 mg Intravenous Q8H  . mupirocin ointment   Nasal BID  . pantoprazole (PROTONIX) IV  40 mg Intravenous QHS  . pneumococcal 23 valent vaccine  0.5 mL Intramuscular Tomorrow-1000  . sodium chloride flush  3 mL Intravenous Q12H  . sucralfate  1 g Oral QID  . tamsulosin  0.4 mg Oral Daily   Continuous Infusions: . dextrose 5 % and 0.9% NaCl 100 mL/hr at 12/18/15 0007  . lactated ringers       LOS: 4 days    Time spent: 15 minutes    Leisa Lenz, MD [facility] Pager [phone]  If 7PM-7AM,  please contact night-coverage [email or web address] Password [facility] 12/18/2015, 2:47 PM

## 2015-12-18 NOTE — Progress Notes (Signed)
Pharmacy Antibiotic Note  Isaac Ramirez is a 64 y.o. male admitted on 12/14/2015 with GPC bacteremia, intra-abdominal infection.  Pharmacy has been consulted for cipro/vancomycin dosing. Pt still has JP drain in place. Afebrile. WBC hovering around 12. Vanc was stopped yesterday after CNS came back 1/2.   Received vanc 1g IV x 1 dose yesterday.  Plan:  Cipro 400mg  IV q12h Flagyl 500mg  IV q8h   Height: 5' (152.4 cm) Weight: 113 lb 12.1 oz (51.6 kg) IBW/kg (Calculated) : 50  Temp (24hrs), Avg:97.5 F (36.4 C), Min:97.5 F (36.4 C), Max:97.5 F (36.4 C)   Recent Labs Lab 12/14/15 1224 12/14/15 1247 12/14/15 1611 12/15/15 0441 12/16/15 0449 12/17/15 0533  WBC 13.8*  --   --  13.6* 12.2* 12.2*  CREATININE 1.43*  --   --  1.20 1.27* 1.25*  LATICACIDVEN  --  4.83* 1.8  --   --   --     Estimated Creatinine Clearance: 42.8 mL/min (by C-G formula based on Cr of 1.25).    Allergies  Allergen Reactions  . Penicillins Anaphylaxis    Has patient had a PCN reaction causing immediate rash, facial/tongue/throat swelling, SOB or lightheadedness with hypotension: Yes Has patient had a PCN reaction causing severe rash involving mucus membranes or skin necrosis: No Has patient had a PCN reaction that required hospitalization No Has patient had a PCN reaction occurring within the last 10 years: No If all of the above answers are "NO", then may proceed with Cephalosporin use.  . Sulfa Antibiotics Other (See Comments)    UNSPECIFIED     Antimicrobials this admission: 4/12 vanc x1; 4/13>>4/15 4/12 aztreonam x1  4/12 levaquin x1  4/13 cipro >>  4/12 flagyl >>   Dose adjustments this admission: NA  Microbiology results: 4/12 BCx2: CNS 1/2 4/12 urine cx: ngtd  4/12 flu: neg   Onnie Boer, PharmD Pager: [phone] 12/18/2015 2:41 PM

## 2015-12-18 NOTE — Progress Notes (Signed)
2 Days Post-Op  Subjective: Arousable.  Afebrile.  Heart rate 98.  SPO2 91% on room air.  Family member present.  They state his mental status is back to normal. Still has Foley catheter but he does not use this at home No vomiting.  On full liquid diet but appetite poor.  JP drain was full of clear bile.  We emptied this.  Nursing staff was unaware he had a drain.  We have discussed this and emphasized need to empty frequently and record output.  Presence of bile leak was explained to family and to nursing staff.  Objective: Vital signs in last 24 hours: Temp:  [97.5 F (36.4 C)-98.9 F (37.2 C)] 97.5 F (36.4 C) (04/16 0418) Pulse Rate:  [88-98] 98 (04/16 0418) Resp:  [14-18] 14 (04/16 0418) BP: (119-127)/(74-79) 119/74 mmHg (04/16 0418) SpO2:  [91 %-95 %] 91 % (04/16 0418) Last BM Date: 12/13/15  Intake/Output from previous day: 04/15 0701 - 04/16 0700 In: 3040 [P.O.:50; I.V.:2290; IV Piggyback:700] Out: 1450 [Urine:1250; Drains:200] Intake/Output this shift: Total I/O In: 1570 [P.O.:50; I.V.:1120; IV Piggyback:400] Out: 100 [Drains:100]   EXAM: General appearance: Awake and verbalizes.  Does not answer questions directly.  Skin warm and dry.  Does not appear ill or toxic. Resp: clear to auscultation bilaterally GI: Tender right epigastrium.  Appears to be incisional pain.  Soft and nontender elsewhere.  Midline incision is clean and intact.  JP drain with clear bile.    Lab Results:  No results found for this or any previous visit (from the past 24 hour(s)).   Studies/Results: No results found.  . Chlorhexidine Gluconate Cloth  6 each Topical Q0600  . ciprofloxacin  400 mg Intravenous Q12H  . enoxaparin (LOVENOX) injection  40 mg Subcutaneous Q24H  . levothyroxine  75 mcg Intravenous QAC breakfast  . metronidazole  500 mg Intravenous Q8H  . mupirocin ointment   Nasal BID  . pantoprazole (PROTONIX) IV  40 mg Intravenous QHS  . pneumococcal 23 valent vaccine  0.5 mL  Intramuscular Tomorrow-1000  . sodium chloride flush  3 mL Intravenous Q12H  . sucralfate  1 g Oral QID  . tamsulosin  0.4 mg Oral Daily     Assessment/Plan: s/p Procedure(s): LAPAROSCOPIC CHOLECYSTECTOMY CONVERTED TO OPEN CHOLECYSTECTOMY WITH  CHOLANGIOGRAM POSSIBLE OPEN  POD #2. Open cholecystectomy - 12/16/2015 - Dr. Rosendo Gros. Severe acute and chronic cholecystitis. Obvious bile leak, but appears controlled by drain. Unable to do cholangiogram Lab work looks okay with normal LFTs.  WBC 12.2.  Hemoglobin 9.0. Okay to mobilize from surgical standpoint. Suspect low-grade fever due to atelectasis.  Afebrile for 24 hours. Advance diet stepwise as tolerated.  Continue antibiotics,, vancomycin discontinued yesterday.  Remote history antrectomy, retrocolic Roux-en-Y gastrojejunostomy for ulcer disease Continue Carafate and PPIs postop Chronic anemia - no evidence of active bleeding. Down syndrome Acute encephalopathy, resolved and back to baseline. VTE prophylaxis. On Lovenox.  @PROBHOSP @  LOS: 4 days    Isaac Ramirez M 12/18/2015  . .prob

## 2015-12-18 NOTE — Progress Notes (Signed)
UR COMPLETED  

## 2015-12-19 LAB — CULTURE, BLOOD (ROUTINE X 2): Culture: NO GROWTH

## 2015-12-19 LAB — BASIC METABOLIC PANEL
ANION GAP: 8 (ref 5–15)
BUN: <5 mg/dL — ABNORMAL LOW (ref 6–20)
CALCIUM: 7.8 mg/dL — AB (ref 8.9–10.3)
CO2: 27 mmol/L (ref 22–32)
Chloride: 108 mmol/L (ref 101–111)
Creatinine, Ser: 1.25 mg/dL — ABNORMAL HIGH (ref 0.61–1.24)
GFR, EST NON AFRICAN AMERICAN: 60 mL/min — AB (ref >60)
GLUCOSE: 116 mg/dL — AB (ref 65–99)
POTASSIUM: 2.8 mmol/L — AB (ref 3.5–5.1)
SODIUM: 143 mmol/L (ref 135–145)

## 2015-12-19 LAB — CBC
HCT: 27.9 % — ABNORMAL LOW (ref 39.0–52.0)
HEMOGLOBIN: 8.7 g/dL — AB (ref 13.0–17.0)
MCH: 25.9 pg — ABNORMAL LOW (ref 26.0–34.0)
MCHC: 31.2 g/dL (ref 30.0–36.0)
MCV: 83 fL (ref 78.0–100.0)
Platelets: 264 10*3/uL (ref 150–400)
RBC: 3.36 MIL/uL — ABNORMAL LOW (ref 4.22–5.81)
RDW: 16.8 % — ABNORMAL HIGH (ref 11.5–15.5)
WBC: 7.6 10*3/uL (ref 4.0–10.5)

## 2015-12-19 MED ORDER — LORAZEPAM 2 MG/ML IJ SOLN
0.5000 mg | Freq: Once | INTRAMUSCULAR | Status: AC
  Administered 2015-12-19: 0.5 mg via INTRAVENOUS
  Filled 2015-12-19: qty 1

## 2015-12-19 MED ORDER — HYDRALAZINE HCL 20 MG/ML IJ SOLN: 10.0000 mg | Freq: Four times a day (QID) | INTRAMUSCULAR | Status: DC | PRN

## 2015-12-19 MED ORDER — AMLODIPINE BESYLATE 5 MG PO TABS
5.0000 mg | ORAL_TABLET | Freq: Every day | ORAL | Status: DC
  Administered 2015-12-19 – 2015-12-20 (×2): 5 mg via ORAL
  Filled 2015-12-19 (×2): qty 1

## 2015-12-19 MED ORDER — ZOLPIDEM TARTRATE 5 MG PO TABS
5.0000 mg | ORAL_TABLET | Freq: Once | ORAL | Status: AC
  Administered 2015-12-19: 5 mg via ORAL
  Filled 2015-12-19: qty 1

## 2015-12-19 MED ORDER — PANTOPRAZOLE SODIUM 40 MG PO TBEC
40.0000 mg | DELAYED_RELEASE_TABLET | Freq: Every day | ORAL | Status: DC
  Administered 2015-12-19: 40 mg via ORAL
  Filled 2015-12-19: qty 1

## 2015-12-19 MED ORDER — POTASSIUM CHLORIDE 10 MEQ/100ML IV SOLN
10.0000 meq | INTRAVENOUS | Status: AC
  Administered 2015-12-19 (×3): 10 meq via INTRAVENOUS
  Filled 2015-12-19 (×3): qty 100

## 2015-12-19 MED ORDER — LEVOTHYROXINE SODIUM 75 MCG PO TABS
150.0000 ug | ORAL_TABLET | Freq: Every day | ORAL | Status: DC
  Administered 2015-12-20: 150 ug via ORAL
  Filled 2015-12-19: qty 2

## 2015-12-19 MED ORDER — HYDRALAZINE HCL 20 MG/ML IJ SOLN: 5.0000 mg | Freq: Four times a day (QID) | INTRAMUSCULAR | Status: DC | PRN

## 2015-12-19 MED ORDER — POTASSIUM CHLORIDE 10 MEQ/100ML IV SOLN
10.0000 meq | Freq: Once | INTRAVENOUS | Status: AC
Start: 2015-12-19 — End: 2015-12-19
  Administered 2015-12-19: 10 meq via INTRAVENOUS
  Filled 2015-12-19: qty 100

## 2015-12-19 NOTE — Progress Notes (Signed)
[facility] Progress Note  3 Days Post-Op  Subjective: Pt doing well.  Tolerating diet well yesterday, looking forward to breakfast.  Not much pain at incision or over drain.  Staples in place.  Drain with frank bilious output.  No N/V.  Ambulating well.  Brother in Sports coach at bedside.  The patient likes to be called Elta [place].  Objective: Vital signs in last 24 hours: Temp:  [97.3 F (36.3 C)-98.4 F (36.9 C)] 97.3 F (36.3 C) (04/17 0439) Pulse Rate:  [89-106] 106 (04/17 0439) Resp:  [18-20] 20 (04/17 0439) BP: (104-169)/(59-91) 169/91 mmHg (04/17 0439) SpO2:  [93 %-96 %] 93 % (04/17 0439) Last BM Date: 12/13/15  Intake/Output from previous day: 04/16 0701 - 04/17 0700 In: 2956.7 [I.V.:2356.7; IV Piggyback:600] Out: 650 [Urine:525; Drains:125] Intake/Output this shift:    PE: Gen:  Alert, NAD, pleasant Abd: Soft, NT/ND, +BS, no HSM, incisions C/D/I with staples in laparoscopic and laparotomy incisions, drain with frank bilious (brown) drainage Ext:  No erythema, edema, or tenderness  Lab Results:   Recent Labs  12/17/15 0533 12/19/15 0536  WBC 12.2* 7.6  HGB 9.0* 8.7*  HCT 28.8* 27.9*  PLT 225 264   BMET  Recent Labs  12/17/15 0533 12/19/15 0536  NA 142 143  K 3.5 2.8*  CL 109 108  CO2 26 27  GLUCOSE 124* 116*  BUN 11 <5*  CREATININE 1.25* 1.25*  CALCIUM 7.8* 7.8*   PT/INR No results for input(s): LABPROT, INR in the last 72 hours. CMP     Component Value Date/Time   NA 143 12/19/2015 0536   K 2.8* 12/19/2015 0536   CL 108 12/19/2015 0536   CO2 27 12/19/2015 0536   GLUCOSE 116* 12/19/2015 0536   BUN <5* 12/19/2015 0536   CREATININE 1.25* 12/19/2015 0536   CALCIUM 7.8* 12/19/2015 0536   PROT 4.7* 12/17/2015 0533   ALBUMIN 1.6* 12/17/2015 0533   AST 24 12/17/2015 0533   ALT 21 12/17/2015 0533   ALKPHOS 97 12/17/2015 0533   BILITOT 0.4 12/17/2015 0533   GFRNONAA 60* 12/19/2015 0536   GFRAA >60 12/19/2015 0536   Lipase     Component  Value Date/Time   LIPASE 28 12/14/2015 1611       Studies/Results: No results found.  Anti-infectives: Anti-infectives    Start     Dose/Rate Route Frequency Ordered Stop   12/16/15 1300  levofloxacin (LEVAQUIN) IVPB 750 mg  Status:  Discontinued     750 mg 100 mL/hr over 90 Minutes Intravenous Every 48 hours 12/14/15 1333 12/14/15 2219   12/16/15 1300  ciprofloxacin (CIPRO) IVPB 400 mg     400 mg 200 mL/hr over 60 Minutes Intravenous Every 12 hours 12/16/15 1251     12/15/15 1445  vancomycin (VANCOCIN) 500 mg in sodium chloride 0.9 % 100 mL IVPB  Status:  Discontinued     500 mg 100 mL/hr over 60 Minutes Intravenous Every 12 hours 12/15/15 1430 12/17/15 1250   12/15/15 1300  vancomycin (VANCOCIN) IVPB 750 mg/150 ml premix  Status:  Discontinued     750 mg 150 mL/hr over 60 Minutes Intravenous Every 24 hours 12/14/15 1333 12/14/15 2221   12/15/15 1300  ciprofloxacin (CIPRO) IVPB 400 mg  Status:  Discontinued     400 mg 200 mL/hr over 60 Minutes Intravenous Every 12 hours 12/14/15 2221 12/16/15 1259   12/14/15 2100  aztreonam (AZACTAM) 1 g in dextrose 5 % 50 mL IVPB  Status:  Discontinued  1 g 100 mL/hr over 30 Minutes Intravenous 3 times per day 12/14/15 1333 12/14/15 1437   12/14/15 1445  metroNIDAZOLE (FLAGYL) IVPB 500 mg     500 mg 100 mL/hr over 60 Minutes Intravenous Every 8 hours 12/14/15 1437     12/14/15 1300  levofloxacin (LEVAQUIN) IVPB 750 mg  Status:  Discontinued     750 mg 100 mL/hr over 90 Minutes Intravenous  Once 12/14/15 1253 12/14/15 1437   12/14/15 1300  aztreonam (AZACTAM) 2 g in dextrose 5 % 50 mL IVPB     2 g 100 mL/hr over 30 Minutes Intravenous  Once 12/14/15 1253 12/14/15 1355   12/14/15 1300  vancomycin (VANCOCIN) IVPB 1000 mg/200 mL premix     1,000 mg 200 mL/hr over 60 Minutes Intravenous  Once 12/14/15 1253 12/14/15 1425       Assessment/Plan POD #3. Open cholecystectomy - 12/16/2015 - Dr. Rosendo Gros. Severe acute and chronic  cholecystitis. -Obvious bile leak, but appears controlled by drain - 151mL output/24hr -Unable to do cholangiogram -Lab work looks okay with normal LFTs. WBC 7.6. Hemoglobin 8.7. -Okay to mobilize from surgical standpoint. -No more fevers, LFT's and WBC normalized -Tolerating soft diet -Continue antibiotics (cipro Day #4/flagyl Day #6), vancomycin discontinued yesterday. -Likely go home soon, f/u with Dr. Rosendo Gros in 2-3 weeks, will arrange for staple removal next week - appts in epic.  Remote history antrectomy, retrocolic Roux-en-Y gastrojejunostomy for ulcer disease -Continue Carafate and PPIs  Chronic anemia - no evidence of active bleeding. Down syndrome Acute encephalopathy, resolved and back to baseline. VTE prophylaxis. On Lovenox.    LOS: 5 days    Nat Christen 12/19/2015, 7:29 AM Pager: [phone]  (7am - 4:30pm M-F; 7am - 11:30am Sa/Su)

## 2015-12-19 NOTE — Progress Notes (Addendum)
Patient ID: Isaac Ramirez, male   DOB: Feb 26, 1951, 65 y.o.   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 In ED, patient was found to have lactate 4.8, critical care has seen the pt in consultation for sepsis. Pt was also seen by surgery. Pt is s/p open cholecystectomy by Dr. Dalbert Batman 4/14.  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 - Stable   Active Problems:   Sepsis ([place]) / Calculus of gallbladder with acute cholecystitis with cystic duct obstruction / Leukocytosis - Sepsis criteria met on admission with tachycardia, tachypnea, leukocytosis and lactic acidosis - Source of infection is cholecystitis - CT abdomen on admission showed cholelithiasis with mild gallbladder wall thickening and possible pericholecystic fluid.  - HIDA scan showed cystic duct obstruction, cholecystitis  - Patient is status post open cholecystectomy by Dr. Dalbert Batman 12/16/2015 - Sepsis etiology resolved 12/15/2015 - Patient remains hemodynamically stable - Per surgery, continue Cipro and Flagyl. We stopped vancomycin 12/17/2015 - As of 4/13, sepsis etiology resolved, pt is hemodynamically stable, lactic acid WNL   Hypokalemia - Likely related to sepsis, acute infection, cholecystitis - Supplemented today    Staph species coagulase-negative in blood culture - One of the blood cultures with staph species coagulase-negative, likely  contaminant    Hypothyroidism - Continue synthroid    PUD (peptic ulcer disease) - Continue Protonix    Down syndrome / Trisomy 21 - Stable     CKD (chronic kidney disease) stage 3, GFR 30-59 ml/min - Baseline creatinine 3 months ago was around 1.5 and on this admission 1.43, at baseline range  - creatinine remains stable at 1.25 in past 48 hours    Gastric outlet obstruction - On prior admission, stable   DVT prophylaxis:  Lovenox subQ  Code Status: DNR/DNI Family Communication: plan of care discussed with the patient's father at the bedside Disposition Plan: home once cleared by surgery    Consultants:   Surgery   CCM  Procedures:   Open cholecystectomy  Antimicrobials:   Cipro, flagyl 12/14/2015 -->   Vanco 12/14/2015 --> 12/17/2015    Subjective: No overnight events. No vomiting.  Objective: Filed Vitals:   12/18/15 1503 12/18/15 2034 12/18/15 2214 12/19/15 0439  BP: 104/59 142/85  169/91  Pulse: 97 99 89 106  Temp: 98 F (36.7 C) 98.4 F (36.9 C)  97.3 F (36.3 C)  TempSrc:      Resp: 18 18 18 20   Height:      Weight:      SpO2: 93% 96% 95% 93%    Intake/Output Summary (Last 24 hours) at 12/19/15 0900 Last data filed at 12/19/15 0526  Gross per 24 hour  Intake 2956.67 ml  Output    650 ml  Net 2306.67 ml   Filed Weights   12/14/15 1213 12/14/15 2108  Weight: 48.988 kg (108 lb) 51.6 kg (113 lb 12.1 oz)    Examination:  General exam: Appears calm, comfortable, no distress  Respiratory system: bilateral air entry, no wheezing Cardiovascular system: S1 & S2 (+), RRR, no murmur Gastrointestinal system: appreciate bowel sounds, nontender abdomen, has drain in place Central nervous system: alert, awake, no focal neurologic deficits Extremities: Strength 5/5, no cyanosis, ulses palpable Skin: warm ,dry, no rash Psychiatry: Normal mood, behavior. No agitation or restlessness.  Data Reviewed: I have personally reviewed following labs and  imaging studies  CBC:  Recent Labs Lab 12/14/15 1224 12/15/15 0441 12/16/15 0449 12/17/15 0533 12/19/15 0536  WBC 13.8* 13.6* 12.2* 12.2* 7.6  NEUTROABS 11.5*  --  10.2*  --   --   HGB 11.3* 11.2* 10.0* 9.0* 8.7*  HCT 34.0* 35.1* 32.0* 28.8* 27.9*  MCV 82.5 82.4 83.6 84.0 83.0  PLT 270 198 216 225 865   Basic Metabolic Panel:  Recent Labs Lab 12/14/15 1224 12/15/15 0441 12/16/15 0449 12/17/15 0533 12/19/15 0536  NA 137 141 141 142 143  K 3.5 3.9 3.0* 3.5 2.8*  CL 98* 107 108 109 108  CO2 25 25 25 26 27   GLUCOSE 178* 104* 85 124* 116*  BUN 14 11 16 11  <5*  CREATININE 1.43* 1.20 1.27* 1.25* 1.25*  CALCIUM 8.7* 8.4* 8.3* 7.8* 7.8*  MG  --  1.4*  --   --   --   PHOS  --  3.0  --   --   --    GFR: Estimated Creatinine Clearance: 42.8 mL/min (by C-G formula based on Cr of 1.25). Liver Function Tests:  Recent Labs Lab 12/14/15 1224 12/15/15 0441 12/16/15 0449 12/17/15 0533  AST 27 104* 32 24  ALT 10* 72* 34 21  ALKPHOS 61 298* 157* 97  BILITOT 0.6 0.6 0.7 0.4  PROT 6.6 6.1* 5.6* 4.7*  ALBUMIN 2.7* 2.3* 1.9* 1.6*    Recent Labs Lab 12/14/15 1611  LIPASE 28  AMYLASE 35   No results for input(s): AMMONIA in the last 168 hours. Coagulation Profile:  Recent Labs Lab 12/14/15 1224  INR 1.11   Cardiac Enzymes: No results for input(s): CKTOTAL, CKMB, CKMBINDEX, TROPONINI in the last 168 hours. BNP (last 3 results) No results for input(s): PROBNP in the last 8760 hours. HbA1C: No results for input(s): HGBA1C in the last 72 hours. CBG: No results for input(s): GLUCAP in the last 168 hours. Lipid Profile: No results for input(s): CHOL, HDL, LDLCALC, TRIG, CHOLHDL, LDLDIRECT in the last 72 hours. Thyroid Function Tests: No results for input(s): TSH, T4TOTAL, FREET4, T3FREE, THYROIDAB in the last 72 hours. Anemia Panel: No results for input(s): VITAMINB12, FOLATE, FERRITIN, TIBC, IRON, RETICCTPCT in the last 72 hours. Urine analysis:    Component  Value Date/Time   COLORURINE YELLOW 12/14/2015 [place] 12/14/2015 1338   LABSPEC 1.017 12/14/2015 1338   PHURINE 6.5 12/14/2015 1338   GLUCOSEU NEGATIVE 12/14/2015 1338   HGBUR MODERATE* 12/14/2015 1338   BILIRUBINUR NEGATIVE 12/14/2015 1338   KETONESUR NEGATIVE 12/14/2015 1338   PROTEINUR >300* 12/14/2015 1338   NITRITE NEGATIVE 12/14/2015 1338   LEUKOCYTESUR NEGATIVE 12/14/2015 1338   Sepsis Labs: @LABRCNTIP (procalcitonin:4,lacticidven:4)   Recent Results (from the past 240 hour(s))  Blood Culture (routine x 2)     Status: Abnormal   Collection Time: 12/14/15  1:09 PM  Result Value Ref Range Status   Specimen Description BLOOD LEFT ANTECUBITAL  Final   Special Requests BOTTLES DRAWN AEROBIC AND ANAEROBIC 5cc  Final   Culture  Setup Time   Final    GRAM POSITIVE COCCI IN CLUSTERS AEROBIC  BOTTLE ONLY CRITICAL RESULT CALLED TO, READ BACK BY AND VERIFIED WITH: G GLEASON,RN AT 1416 12/15/15 BY L BENFIELD    Culture (A)  Final    STAPHYLOCOCCUS SPECIES (COAGULASE NEGATIVE) THE SIGNIFICANCE OF ISOLATING THIS ORGANISM FROM A SINGLE SET OF BLOOD CULTURES WHEN MULTIPLE SETS ARE DRAWN IS UNCERTAIN. PLEASE NOTIFY THE MICROBIOLOGY DEPARTMENT WITHIN ONE WEEK IF SPECIATION AND SENSITIVITIES ARE REQUIRED.    Report Status 12/17/2015 FINAL  Final  Blood Culture (routine x 2)     Status: None (Preliminary result)   Collection Time: 12/14/15  1:19 PM  Result Value Ref Range Status   Specimen Description BLOOD BLOOD RIGHT FOREARM  Final   Special Requests BOTTLES DRAWN AEROBIC AND ANAEROBIC 5CC  Final   Culture NO GROWTH 4 DAYS  Final   Report Status PENDING  Incomplete  Urine culture     Status: None   Collection Time: 12/14/15  1:38 PM  Result Value Ref Range Status   Specimen Description URINE, CATHETERIZED  Final   Special Requests NONE  Final   Culture NO GROWTH 2 DAYS  Final   Report Status 12/16/2015 FINAL  Final  Surgical pcr screen     Status: Abnormal    Collection Time: 12/16/15  8:10 AM  Result Value Ref Range Status   MRSA, PCR NEGATIVE NEGATIVE Final   Staphylococcus aureus POSITIVE (A) NEGATIVE Final    Comment:        The Xpert SA Assay (FDA approved for NASAL specimens in patients over 57 years of age), is one component of a comprehensive surveillance program.  Test performance has been validated by [facility] for patients greater than or equal to 99 year old. It is not intended to diagnose infection nor to guide or monitor treatment.       Radiology Studies: Nm Hepatobiliary Including Gb 12/15/2015 Cystic duct obstruction - when combined with CT findings consistent with cholecystitis.   Ct Abdomen Pelvis Wo Contrast 12/14/2015 Mild bilateral hydroureteronephrosis secondary to severe urinary bladder distention. No renal or ureteral calculi are noted. Stable small fat containing ventral hernia seen in the pelvis. Cholelithiasis is noted with mild gallbladder wall thickening and possible pericholecystic fluid. Acute cholecystitis cannot be excluded, and HIDA scan may be performed for further evaluation.   Ct Head Wo Contrast 12/14/2015  Minimal diffuse cortical atrophy. Mild chronic ischemic white matter disease. Focal low density seen in the superior portion of the right parietal cortex concerning for infarction of indeterminate age. MRI is recommended for further evaluation.   Mr Brain Wo Contrast 12/14/2015  1. No acute intracranial abnormality. 2. Remote left parietal lobe nonhemorrhagic infarct. 3. Mild atrophy and white matter disease bilaterally extending into the brainstem. 4. Remote lacunar infarcts of the left cerebellum.   Dg Chest Port 1 View 12/14/2015  No active cardiopulmonary disease.    Scheduled Meds: . amLODipine  5 mg Oral Daily  . Chlorhexidine Gluconate Cloth  6 each Topical Q0600  . ciprofloxacin  400 mg Intravenous Q12H  . enoxaparin (LOVENOX) injection  40 mg Subcutaneous Q24H  . levothyroxine  75  mcg Intravenous QAC breakfast  . metronidazole  500 mg Intravenous Q8H  . mupirocin ointment   Nasal BID  . pantoprazole (PROTONIX) IV  40 mg Intravenous QHS  . pneumococcal 23 valent vaccine  0.5 mL Intramuscular Tomorrow-1000  . potassium chloride  10 mEq Intravenous Q1 Hr x 4  . sodium chloride flush  3 mL Intravenous Q12H  . sucralfate  1  g Oral QID  . tamsulosin  0.4 mg Oral Daily   Continuous Infusions: . dextrose 5 % and 0.9% NaCl 100 mL/hr at 12/19/15 0501  . lactated ringers       LOS: 5 days    Time spent: 25 minutes    Leisa Lenz, MD [facility] Pager [phone]  If 7PM-7AM, please contact night-coverage [email or web address] Password [email or web address] 12/19/2015, 9:00 AM

## 2015-12-19 NOTE — Progress Notes (Signed)
Pharmacy Antibiotic Note  On Cipro for intra-abdominal infection s/p open chole. Renal function is stable, SCr 1.25, est CrCl 43 ml/min. Culture data negative. Likely d/c soon.  Continue current antibiotics Spoke with Jinny Blossom from surgery who will change to PO abx when appropriate Pharmacy signing off  [facility], [place].D., BCPS Clinical Pharmacist Pager: [phone] 12/19/2015 2:14 PM

## 2015-12-19 NOTE — Care Management Important Message (Signed)
Important Message  Patient Details  Name: Isaac Ramirez MRN: [id number] Date of Birth: 06-22-1951   Medicare Important Message Given:  Yes    [name] P Rodrigo Mcgranahan 12/19/2015, 2:02 PM

## 2015-12-20 ENCOUNTER — Encounter (HOSPITAL_COMMUNITY): Payer: Self-pay | Admitting: General Surgery

## 2015-12-20 LAB — BASIC METABOLIC PANEL
Anion gap: 9 (ref 5–15)
BUN: 5 mg/dL — ABNORMAL LOW (ref 6–20)
CHLORIDE: 109 mmol/L (ref 101–111)
CO2: 24 mmol/L (ref 22–32)
CREATININE: 1.29 mg/dL — AB (ref 0.61–1.24)
Calcium: 7.7 mg/dL — ABNORMAL LOW (ref 8.9–10.3)
GFR calc non Af Amer: 57 mL/min — ABNORMAL LOW (ref >60)
GLUCOSE: 96 mg/dL (ref 65–99)
Potassium: 3 mmol/L — ABNORMAL LOW (ref 3.5–5.1)
Sodium: 142 mmol/L (ref 135–145)

## 2015-12-20 LAB — CBC
HCT: 27.6 % — ABNORMAL LOW (ref 39.0–52.0)
Hemoglobin: 8.9 g/dL — ABNORMAL LOW (ref 13.0–17.0)
MCH: 26.3 pg (ref 26.0–34.0)
MCHC: 32.2 g/dL (ref 30.0–36.0)
MCV: 81.7 fL (ref 78.0–100.0)
PLATELETS: 259 10*3/uL (ref 150–400)
RBC: 3.38 MIL/uL — AB (ref 4.22–5.81)
RDW: 16.7 % — ABNORMAL HIGH (ref 11.5–15.5)
WBC: 8.8 10*3/uL (ref 4.0–10.5)

## 2015-12-20 MED ORDER — POTASSIUM CHLORIDE CRYS ER 20 MEQ PO TBCR
40.0000 meq | EXTENDED_RELEASE_TABLET | Freq: Once | ORAL | Status: AC
  Administered 2015-12-20: 40 meq via ORAL
  Filled 2015-12-20: qty 2

## 2015-12-20 NOTE — Progress Notes (Signed)
[facility] Progress Note  4 Days Post-Op  Subjective: Pt feels good.  No N/V, ambulating well.  Tolerating diet.  Confused this am, but family friend thinks that's due to Alzheimer's.  He has not complained of pain.  He had a BM and lots of flatus overnight.    Objective: Vital signs in last 24 hours: Temp:  [98 F (36.7 C)-98.9 F (37.2 C)] 98.9 F (37.2 C) (04/18 0450) Pulse Rate:  [92-111] 100 (04/18 0450) Resp:  [14-20] 14 (04/18 0450) BP: (127-162)/(76-96) 127/76 mmHg (04/18 0450) SpO2:  [94 %-96 %] 96 % (04/18 0450) Last BM Date: 12/20/15  Intake/Output from previous day: 04/17 0701 - 04/18 0700 In: 3413.3 [P.O.:150; I.V.:2463.3; IV Piggyback:800] Out: [id number] [Urine:1800; Drains:30] Intake/Output this shift:    PE: Gen:  Alert, NAD, pleasant Card:  RRR, no M/G/R heard Pulm:  CTA, no W/R/R Abd: Soft, NT/ND, +BS, no HSM, incisions C/D/I with staples in place, drain with brown serosanguinous drainage   Lab Results:   Recent Labs  12/19/15 0536  WBC 7.6  HGB 8.7*  HCT 27.9*  PLT 264   BMET  Recent Labs  12/19/15 0536  NA 143  K 2.8*  CL 108  CO2 27  GLUCOSE 116*  BUN <5*  CREATININE 1.25*  CALCIUM 7.8*   PT/INR No results for input(s): LABPROT, INR in the last 72 hours. CMP     Component Value Date/Time   NA 143 12/19/2015 0536   K 2.8* 12/19/2015 0536   CL 108 12/19/2015 0536   CO2 27 12/19/2015 0536   GLUCOSE 116* 12/19/2015 0536   BUN <5* 12/19/2015 0536   CREATININE 1.25* 12/19/2015 0536   CALCIUM 7.8* 12/19/2015 0536   PROT 4.7* 12/17/2015 0533   ALBUMIN 1.6* 12/17/2015 0533   AST 24 12/17/2015 0533   ALT 21 12/17/2015 0533   ALKPHOS 97 12/17/2015 0533   BILITOT 0.4 12/17/2015 0533   GFRNONAA 60* 12/19/2015 0536   GFRAA >60 12/19/2015 0536   Lipase     Component Value Date/Time   LIPASE 28 12/14/2015 1611       Studies/Results: No results found.  Anti-infectives: Anti-infectives    Start     Dose/Rate  Route Frequency Ordered Stop   12/16/15 1300  levofloxacin (LEVAQUIN) IVPB 750 mg  Status:  Discontinued     750 mg 100 mL/hr over 90 Minutes Intravenous Every 48 hours 12/14/15 1333 12/14/15 2219   12/16/15 1300  ciprofloxacin (CIPRO) IVPB 400 mg     400 mg 200 mL/hr over 60 Minutes Intravenous Every 12 hours 12/16/15 1251     12/15/15 1445  vancomycin (VANCOCIN) 500 mg in sodium chloride 0.9 % 100 mL IVPB  Status:  Discontinued     500 mg 100 mL/hr over 60 Minutes Intravenous Every 12 hours 12/15/15 1430 12/17/15 1250   12/15/15 1300  vancomycin (VANCOCIN) IVPB 750 mg/150 ml premix  Status:  Discontinued     750 mg 150 mL/hr over 60 Minutes Intravenous Every 24 hours 12/14/15 1333 12/14/15 2221   12/15/15 1300  ciprofloxacin (CIPRO) IVPB 400 mg  Status:  Discontinued     400 mg 200 mL/hr over 60 Minutes Intravenous Every 12 hours 12/14/15 2221 12/16/15 1259   12/14/15 2100  aztreonam (AZACTAM) 1 g in dextrose 5 % 50 mL IVPB  Status:  Discontinued     1 g 100 mL/hr over 30 Minutes Intravenous 3 times per day 12/14/15 1333 12/14/15 1437   12/14/15 1445  metroNIDAZOLE (FLAGYL) IVPB 500 mg     500 mg 100 mL/hr over 60 Minutes Intravenous Every 8 hours 12/14/15 1437     12/14/15 1300  levofloxacin (LEVAQUIN) IVPB 750 mg  Status:  Discontinued     750 mg 100 mL/hr over 90 Minutes Intravenous  Once 12/14/15 1253 12/14/15 1437   12/14/15 1300  aztreonam (AZACTAM) 2 g in dextrose 5 % 50 mL IVPB     2 g 100 mL/hr over 30 Minutes Intravenous  Once 12/14/15 1253 12/14/15 1355   12/14/15 1300  vancomycin (VANCOCIN) IVPB 1000 mg/200 mL premix     1,000 mg 200 mL/hr over 60 Minutes Intravenous  Once 12/14/15 1253 12/14/15 1425       Assessment/Plan POD #4. Open cholecystectomy - 12/16/2015 - Dr. Rosendo Gros. Severe acute and chronic cholecystitis. -Obvious bile leak, but appears controlled by drain - 51mL output/24hr, but about 58mL in the bulb currently.  Drainage is becoming more  serosanguinous. -Unable to do cholangiogram -Lab work looks okay with normal LFTs. WBC 7.6. Hemoglobin 8.7. -Mobilize and IS -Dry dressing change daily prn -Tolerating soft diet -D/c antibiotics at discharge (cipro Day #6/flagyl Day #7), vancomycin discontinued.  No further antibiotics needed. -F/u with Dr. Rosendo Gros in 2-3 weeks, will arrange for staple removal next week - appts in epic.  Remote history antrectomy, retrocolic Roux-en-Y gastrojejunostomy for ulcer disease -Continue Carafate and PPIs  Chronic anemia - no evidence of active bleeding. Down syndrome Acute encephalopathy, resolved and back to baseline.  VTE prophylaxis. On Lovenox. Disp - Cleared for discharge home from surgical perspective, close follow up arranged    LOS: 6 days    Nat Christen 12/20/2015, 7:28 AM Pager: [phone]  (7am - 4:30pm M-F; 7am - 11:30am Sa/Su)

## 2015-12-20 NOTE — Progress Notes (Signed)
DCd to care of family. Bandage to midabdomen changed, dressed with 4x4, abd, and hypafix. Staples in place. JP drain in place, 30 cc bile appearance drainage emptied from bulb. Binder wrapped around pt. Explained directions to family.

## 2015-12-20 NOTE — Discharge Summary (Signed)
Physician Discharge Summary  Isaac Ramirez Mcgregory ZOX:[id number] DOB: 07/06/1951 DOA: 12/14/2015  PCP: Donnie Coffin, MD  Admit date: 12/14/2015 Discharge date: 12/20/2015  Recommendations for Outpatient Follow-up:  1. 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 In ED, patient was found to have lactate 4.8, critical care has seen the pt in consultation for sepsis. Pt was also seen by surgery. Pt is s/p open cholecystectomy by Dr. Dalbert Batman 4/14.  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 - Stable   Active Problems:  Sepsis ([place]) / Calculus of gallbladder with acute cholecystitis with cystic duct obstruction / Leukocytosis - Sepsis criteria met on admission with tachycardia, tachypnea, leukocytosis and lactic acidosis - Source of infection is cholecystitis - CT abdomen on admission showed cholelithiasis with mild gallbladder wall thickening and possible pericholecystic fluid.  - HIDA scan showed cystic duct obstruction, cholecystitis  - Patient is status post open cholecystectomy  by Dr. Dalbert Batman 12/16/2015 - Sepsis etiology resolved 12/15/2015 - Patient remains hemodynamically stable - We stopped vancomycin 12/17/2015 - As of 4/13, sepsis etiology resolved, pt is hemodynamically stable, lactic acid WNL - Per surgery no need for abx on discharge   Hypokalemia - Likely related to sepsis, acute infection, cholecystitis - Supplemented today prior to discharge    Staph species coagulase-negative in blood culture - One of the blood cultures with staph species coagulase-negative, likely contaminant   Hypothyroidism - Continue synthroid   PUD (peptic ulcer disease) - Continue Protonix   Down syndrome / Trisomy 21 - Stable    CKD (chronic kidney disease) stage 3, GFR 30-59 ml/min - Baseline creatinine 3 months ago was around 1.5 and on this admission 1.43, at baseline range  - creatinine remains stable at 1.25 in past 48 hours   Gastric outlet obstruction - On prior admission, stable   DVT prophylaxis: Lovenox subQ  Code Status: DNR/DNI Family Communication: plan of care discussed with the patient's father at the bedside  Consultants:   Surgery   CCM  Procedures:   Open cholecystectomy  Antimicrobials:   Cipro, flagyl 12/14/2015 --> 12/20/2015  Vanco 12/14/2015 --> 12/17/2015     Signed:  Leisa Lenz, MD  [facility] 12/20/2015, 12:00 PM  Pager #: [phone]  Time spent in minutes: more than 30 minutes   Discharge Exam: Filed Vitals:   12/19/15 2135 12/20/15 0450  BP:  127/76  Pulse: 100 100  Temp:  98.9 F (37.2 C)  Resp: 18 14   Filed Vitals:   12/19/15 1416 12/19/15 2127 12/19/15 2135 12/20/15 0450  BP: 160/96 162/90  127/76  Pulse:  92 111 100 100  Temp: 98 F (36.7 C) 98.6 F (37 C)  98.9 F (37.2 C)  TempSrc:  Oral    Resp: _0 Height:      Weight:      SpO2: 96% 94% 95% 96%    General: Pt is alert, not in acute distress Cardiovascular: Regular rate and rhythm, S1/S2 +, no  murmurs Respiratory: Clear to auscultation bilaterally, no wheezing, no crackles, no rhonchi Abdominal: Soft, non tender, non distended, bowel sounds +, no guarding Extremities: no edema, no cyanosis, pulses palpable bilaterally DP and PT Neuro: Grossly nonfocal  Discharge Instructions  Discharge Instructions    Call MD for:  difficulty breathing, headache or visual disturbances    Complete by:  As directed      Call MD for:  persistant dizziness or light-headedness    Complete by:  As directed      Call MD for:  persistant nausea and vomiting    Complete by:  As directed      Call MD for:  redness, tenderness, or signs of infection (pain, swelling, redness, odor or green/yellow discharge around incision site)    Complete by:  As directed      Diet - low sodium heart healthy    Complete by:  As directed      Increase activity slowly    Complete by:  As directed             Medication List    TAKE these medications        feeding supplement (ENSURE ENLIVE) Liqd  Take 237 mLs by mouth 2 (two) times daily between meals.     levothyroxine 150 MCG tablet  Commonly known as:  SYNTHROID, LEVOTHROID  Take 150 mcg by mouth daily before breakfast.     pantoprazole sodium 40 mg/20 mL Pack  Commonly known as:  PROTONIX  Take 20 mLs (40 mg total) by mouth 2 (two) times daily.     sucralfate 1 g tablet  Commonly known as:  CARAFATE  Take 1 g by mouth 4 (four) times daily.           Follow-up Information    Follow up with [facility]. Go on 12/26/2015.   Specialty:  General Surgery   Why:  For Staple removal 4/24 at 9:30am, please arrive 30 minutes early to check in and fill out paperwork.   Contact information:   [address] [place] [phone]      Follow up with Rosario Jacks., Anne Hahn, MD. Go on 01/02/2016.   Specialty:  General Surgery   Why:  For post-operation check on 01/02/16 at 11:30am, please arrive 15  minutes early to check in.  Your appointment is with Dr. Pamala Duffel information:   [place] [address] [phone]       Follow up with Donnie Coffin, MD. Schedule an appointment as soon as possible for a visit in 1 week.   Specialty:  Family Medicine   Why:  Follow up appt after recent hospitalization   Contact information:   [address] [place] [address] [phone]        The results of significant diagnostics from this hospitalization (including imaging, microbiology, ancillary and laboratory) are listed below for reference.    Significant Diagnostic Studies: Ct Abdomen Pelvis Wo Contrast  12/14/2015  CLINICAL DATA:  Lethargy, sepsis.  EXAM: CT ABDOMEN AND PELVIS WITHOUT CONTRAST TECHNIQUE: Multidetector CT imaging of the abdomen and pelvis was performed following the standard protocol without IV contrast. COMPARISON:  CT scan of August 18, 2015. FINDINGS: Multilevel degenerative disc disease is noted in the lumbar spine. Visualized lung bases are unremarkable. Multiple gallstones are noted with mild gallbladder wall thickening and possible pericholecystic fluid. No focal abnormality seen in the liver, spleen or pancreas on these unenhanced images. Adrenal glands are unremarkable. Mild bilateral hydroureteronephrosis is noted secondary to severe urinary bladder distention. No renal or ureteral calculi are noted. The appendix appears normal. There is no evidence of bowel obstruction. Status post gastrojejunostomy. No significant adenopathy is noted. Stable small fat containing ventral hernia is seen anteriorly in the pelvis. IMPRESSION: Mild bilateral hydroureteronephrosis secondary to severe urinary bladder distention. No renal or ureteral calculi are noted. Stable small fat containing ventral hernia seen in the pelvis. Cholelithiasis is noted with mild gallbladder wall thickening and possible pericholecystic fluid. Acute  cholecystitis cannot be excluded, and HIDA scan may be performed for further evaluation. Electronically Signed   By: Marijo Conception, M.D.   On: 12/14/2015 16:26   Ct Head Wo Contrast  12/14/2015  CLINICAL DATA:  Altered mental status. EXAM: CT HEAD WITHOUT CONTRAST TECHNIQUE: Contiguous axial images were obtained from the base of the skull through the vertex without intravenous contrast. COMPARISON:  None. FINDINGS: Bony calvarium appears intact. Minimal diffuse cortical atrophy is noted. Mild chronic ischemic white matter disease is noted. Focal low density is seen involving superior portion of right parietal cortex concerning for infarction of indeterminate age. No mass effect midline shift is noted. Ventricular size is within normal limits. There is no evidence of mass lesion or hemorrhage. IMPRESSION: Minimal diffuse cortical atrophy. Mild chronic ischemic white matter disease. Focal low density seen in the superior portion of the right parietal cortex concerning for infarction of indeterminate age. MRI is recommended for further evaluation. Electronically Signed   By: Marijo Conception, M.D.   On: 12/14/2015 14:18   Mr Brain Wo Contrast  12/14/2015  CLINICAL DATA:  Altered mental status. Decreased responsiveness. Abnormal CT scan. EXAM: MRI HEAD WITHOUT CONTRAST TECHNIQUE: Multiplanar, multiecho pulse sequences of the brain and surrounding structures were obtained without intravenous contrast. COMPARISON:  CT head without contrast from the same day. FINDINGS: A remote nonhemorrhagic infarct is present within the left parietal lobe, corresponding to the CT finding. No acute infarct, hemorrhage, or mass lesion is present. Mild generalized atrophy is present. There is more prominent posterior white matter loss with ex vacuo dilation of the lateral ventricles bilaterally. Remote lacunar infarcts are present in the left basal ganglia. The internal auditory canals are within normal limits bilaterally. White  matter changes extend into the brainstem. The cerebellum is unremarkable. Flow is present in the major intracranial arteries. Bilateral lens replacements are present. Globes and orbits are otherwise within normal limits. The paranasal sinuses are clear. The frontal sinuses are not pneumatized. The a left mastoid effusion is present. No obstructing nasopharyngeal lesion is evident. IMPRESSION: 1. No acute intracranial abnormality. 2. Remote left parietal lobe nonhemorrhagic infarct. 3. Mild atrophy and white matter disease bilaterally extending into the brainstem. 4. Remote lacunar infarcts of the left cerebellum. Electronically Signed   By: San Morelle M.D.   On: 12/14/2015 17:56   Nm Hepatobiliary Including Gb  12/15/2015  CLINICAL DATA:  Cholecystitis. EXAM: NUCLEAR MEDICINE HEPATOBILIARY IMAGING TECHNIQUE: Sequential images of the abdomen were obtained out to 60  minutes following intravenous administration of radiopharmaceutical. RADIOPHARMACEUTICALS:  Five mCi Tc-13m Choletec IV COMPARISON:  CT from yesterday FINDINGS: There is prompt uptake into the liver. No hypervascular rim sign seen. The biliary tree is seen by 15 minutes and bowel by 25 minutes. The gallbladder was not seen in the first hour. Morphine was not administered and the patient was returned to the floor but brought back for delayed imaging. On the 3 hour delayed image the gallbladder is still not seen. There has been progression of tracer through the bowel. These results will be called to the ordering clinician or representative by the Radiologist Assistant, and communication documented in the PACS or zVision Dashboard. IMPRESSION: Cystic duct obstruction - when combined with CT findings consistent with cholecystitis. Electronically Signed   By: JMonte FantasiaM.D.   On: 12/15/2015 15:29   Dg Chest Port 1 View  12/14/2015  CLINICAL DATA:  Altered mental status EXAM: PORTABLE CHEST 1 VIEW COMPARISON:  08/17/2015 FINDINGS: Heart  and mediastinal contours are within normal limits. No focal opacities or effusions. No acute bony abnormality. Degenerative changes in the shoulders and thoracic spine. IMPRESSION: No active cardiopulmonary disease. Electronically Signed   By: KRolm BaptiseM.D.   On: 12/14/2015 13:37    Microbiology: Recent Results (from the past 240 hour(s))  Blood Culture (routine x 2)     Status: Abnormal   Collection Time: 12/14/15  1:09 PM  Result Value Ref Range Status   Specimen Description BLOOD LEFT ANTECUBITAL  Final   Special Requests BOTTLES DRAWN AEROBIC AND ANAEROBIC 5cc  Final   Culture  Setup Time   Final    GRAM POSITIVE COCCI IN CLUSTERS AEROBIC BOTTLE ONLY CRITICAL RESULT CALLED TO, READ BACK BY AND VERIFIED WITH: G GLEASON,RN AT 1416 12/15/15 BY L BENFIELD    Culture (A)  Final    STAPHYLOCOCCUS SPECIES (COAGULASE NEGATIVE) THE SIGNIFICANCE OF ISOLATING THIS ORGANISM FROM A SINGLE SET OF BLOOD CULTURES WHEN MULTIPLE SETS ARE DRAWN IS UNCERTAIN. PLEASE NOTIFY THE MICROBIOLOGY DEPARTMENT WITHIN ONE WEEK IF SPECIATION AND SENSITIVITIES ARE REQUIRED.    Report Status 12/17/2015 FINAL  Final  Blood Culture (routine x 2)     Status: None   Collection Time: 12/14/15  1:19 PM  Result Value Ref Range Status   Specimen Description BLOOD BLOOD RIGHT FOREARM  Final   Special Requests BOTTLES DRAWN AEROBIC AND ANAEROBIC 5CC  Final   Culture NO GROWTH 5 DAYS  Final   Report Status 12/19/2015 FINAL  Final  Urine culture     Status: None   Collection Time: 12/14/15  1:38 PM  Result Value Ref Range Status   Specimen Description URINE, CATHETERIZED  Final   Special Requests NONE  Final   Culture NO GROWTH 2 DAYS  Final   Report Status 12/16/2015 FINAL  Final  Surgical pcr screen     Status: Abnormal   Collection Time: 12/16/15  8:10 AM  Result Value Ref Range Status   MRSA, PCR NEGATIVE NEGATIVE Final   Staphylococcus aureus POSITIVE (A) NEGATIVE Final    Comment:        The Xpert SA Assay  (FDA approved for NASAL specimens in patients over 247years of age), is one component of a comprehensive surveillance program.  Test performance has been validated [facility] greater than or equal to 140year old. It is not intended to diagnose infection nor to guide or monitor treatment.      Labs:  Basic Metabolic Panel:  Recent Labs Lab 12/15/15 0441 12/16/15 0449 12/17/15 0533 12/19/15 0536 12/20/15 0725  NA 141 141 142 143 142  K 3.9 3.0* 3.5 2.8* 3.0*  CL 107 108 109 108 109  CO2 _0 GLUCOSE 104* 85 124* 116* 96  BUN _1 <5* 5*  CREATININE 1.20 1.27* 1.25* 1.25* 1.29*  CALCIUM 8.4* 8.3* 7.8* 7.8* 7.7*  MG 1.4*  --   --   --   --   PHOS 3.0  --   --   --   --    Liver Function Tests:  Recent Labs Lab 12/14/15 1224 12/15/15 0441 12/16/15 0449 12/17/15 0533  AST 27 104* 32 24  ALT 10* 72* 34 21  ALKPHOS 61 298* 157* 97  BILITOT 0.6 0.6 0.7 0.4  PROT 6.6 6.1* 5.6* 4.7*  ALBUMIN 2.7* 2.3* 1.9* 1.6*    Recent Labs Lab 12/14/15 1611  LIPASE 28  AMYLASE 35   No results for input(s): AMMONIA in the last 168 hours. CBC:  Recent Labs Lab 12/14/15 1224 12/15/15 0441 12/16/15 0449 12/17/15 0533 12/19/15 0536 12/20/15 0725  WBC 13.8* 13.6* 12.2* 12.2* 7.6 8.8  NEUTROABS 11.5*  --  10.2*  --   --   --   HGB 11.3* 11.2* 10.0* 9.0* 8.7* 8.9*  HCT 34.0* 35.1* 32.0* 28.8* 27.9* 27.6*  MCV 82.5 82.4 83.6 84.0 83.0 81.7  PLT 270 198 216 225 264 259   Cardiac Enzymes: No results for input(s): CKTOTAL, CKMB, CKMBINDEX, TROPONINI in the last 168 hours. BNP: BNP (last 3 results)  Recent Labs  08/11/15 0315  BNP 80.1    ProBNP (last 3 results) No results for input(s): PROBNP in the last 8760 hours.  CBG: No results for input(s): GLUCAP in the last 168 hours.

## 2015-12-20 NOTE — Discharge Instructions (Signed)
[facility]      [facility] [phone]  OPEN ABDOMINAL SURGERY: POST OP INSTRUCTIONS  Always review your discharge instruction sheet given to you by the facility where your surgery was performed.  IF YOU HAVE DISABILITY OR FAMILY LEAVE FORMS, YOU MUST BRING THEM TO THE OFFICE FOR PROCESSING.  PLEASE DO NOT GIVE THEM TO YOUR DOCTOR.  1. A prescription for pain medication may be given to you upon discharge.  Take your pain medication as prescribed, if needed.  If narcotic pain medicine is not needed, then you may take acetaminophen (Tylenol) or ibuprofen (Advil) as needed. 2. Take your usually prescribed medications unless otherwise directed. 3. If you need a refill on your pain medication, please contact your pharmacy. They will contact our office to request authorization.  Prescriptions will not be filled after 5pm or on week-ends. 4. You should follow a light diet the first few days after arrival home, such as soup and crackers, pudding, etc.unless your doctor has advised otherwise. A high-fiber, low fat diet can be resumed as tolerated.   Be sure to include lots of fluids daily. Most patients will experience some swelling and bruising on the chest and neck area.  Ice packs will help.  Swelling and bruising can take several days to resolve 5. Most patients will experience some swelling and bruising in the area of the incision. Ice pack will help. Swelling and bruising can take several days to resolve..  6. It is common to experience some constipation if taking pain medication after surgery.  Increasing fluid intake and taking a stool softener will usually help or prevent this problem from occurring.  A mild laxative (Milk of Magnesia or Miralax) should be taken according to package directions if there are no bowel movements after 48 hours. 7.  You may have steri-strips (small skin tapes) in place directly over the incision.  These strips should be left on the skin for 7-10 days.  If your  surgeon used skin glue on the incision, you may shower in 24 hours.  The glue will flake off over the next 2-3 weeks.  Any sutures or staples will be removed at the office during your follow-up visit. You may find that a light gauze bandage over your incision may keep your staples from being rubbed or pulled. You may shower and replace the bandage daily. 8. ACTIVITIES:  You may resume regular (light) daily activities beginning the next day--such as daily self-care, walking, climbing stairs--gradually increasing activities as tolerated.  You may have sexual intercourse when it is comfortable.  Refrain from any heavy lifting or straining until approved by your doctor. a. You may drive when you no longer are taking prescription pain medication, you can comfortably wear a seatbelt, and you can safely maneuver your car and apply brakes b. Return to Work: ___________________________________ 9. You should see your doctor in the office for a follow-up appointment approximately two weeks after your surgery.  Make sure that you call for this appointment within a day or two after you arrive home to insure a convenient appointment time. OTHER INSTRUCTIONS:  _____________________________________________________________ _____________________________________________________________  WHEN TO CALL YOUR DOCTOR: 1. Fever over 101.0 2. Inability to urinate 3. Nausea and/or vomiting 4. Extreme swelling or bruising 5. Continued bleeding from incision. 6. Increased pain, redness, or drainage from the incision. 7. Difficulty swallowing or breathing 8. Muscle cramping or spasms. 9. Numbness or tingling in hands or feet or around lips.  The clinic staff is available to   answer your questions during regular business hours.  Please don't hesitate to call and ask to speak to one of the nurses if you have concerns.  For further questions, please visit [email or web address]   

## 2015-12-27 DIAGNOSIS — M150 Primary generalized (osteo)arthritis: Secondary | ICD-10-CM | POA: Diagnosis not present

## 2015-12-27 DIAGNOSIS — Q909 Down syndrome, unspecified: Secondary | ICD-10-CM | POA: Diagnosis not present

## 2015-12-27 DIAGNOSIS — K219 Gastro-esophageal reflux disease without esophagitis: Secondary | ICD-10-CM | POA: Diagnosis not present

## 2016-01-02 ENCOUNTER — Other Ambulatory Visit (HOSPITAL_COMMUNITY): Payer: Self-pay | Admitting: General Surgery

## 2016-01-02 DIAGNOSIS — K432 Incisional hernia without obstruction or gangrene: Secondary | ICD-10-CM

## 2016-01-03 ENCOUNTER — Encounter (HOSPITAL_COMMUNITY)
Admission: RE | Admit: 2016-01-03 | Discharge: 2016-01-03 | Disposition: A | Discharge status: Home / Self Care | Payer: Medicare Other | Source: Ambulatory Visit | Attending: General Surgery | Admitting: General Surgery

## 2016-01-03 DIAGNOSIS — K432 Incisional hernia without obstruction or gangrene: Secondary | ICD-10-CM | POA: Diagnosis not present

## 2016-01-03 DIAGNOSIS — Z0389 Encounter for observation for other suspected diseases and conditions ruled out: Secondary | ICD-10-CM | POA: Diagnosis not present

## 2016-01-03 MED ORDER — TECHNETIUM TC 99M MEBROFENIN IV KIT
5.2400 | PACK | Freq: Once | INTRAVENOUS | Status: AC | PRN
  Administered 2016-01-03: 5 via INTRAVENOUS

## 2016-06-20 ENCOUNTER — Ambulatory Visit (INDEPENDENT_AMBULATORY_CARE_PROVIDER_SITE_OTHER): Payer: Medicare Other | Admitting: Neurology

## 2016-06-20 ENCOUNTER — Encounter: Payer: Self-pay | Admitting: Neurology

## 2016-06-20 VITALS — BP 120/76 | HR 84 | Ht 59.0 in | Wt 107.4 lb

## 2016-06-20 DIAGNOSIS — Q909 Down syndrome, unspecified: Secondary | ICD-10-CM | POA: Diagnosis not present

## 2016-06-20 DIAGNOSIS — F02818 Dementia in other diseases classified elsewhere, unspecified severity, with other behavioral disturbance: Secondary | ICD-10-CM

## 2016-06-20 DIAGNOSIS — F0281 Dementia in other diseases classified elsewhere with behavioral disturbance: Secondary | ICD-10-CM

## 2016-06-20 NOTE — Patient Instructions (Signed)
It was great meeting you! Continue 24/7 care and monitoring home safety. Call our office for any changes or questions, follow-up as needed

## 2016-06-20 NOTE — Progress Notes (Signed)
NEUROLOGY CONSULTATION NOTE  Isaac Ramirez MRN: [id number] DOB: 63-Jan-1952  Referring provider: Dr. Donnie Coffin Primary care provider: Dr. Donnie Coffin  Reason for consult:  dementia  Dear Dr Alroy Dust:  Thank you for your kind referral of Isaac Ramirez for consultation of the above symptoms. Although his history is well known to you, please allow me to reiterate it for the purpose of our medical record. The patient was accompanied to the clinic by his mother, sister, and brother-in-law who also provide collateral information. Records and images were personally reviewed where available.  HISTORY OF PRESENT ILLNESS: This is a pleasant 65 year old man with Down syndrome, hypothyroidism, presenting for evaluation and prognosis questions regarding his dementia. Due to cognitive impairment, he has some difficulties answering questions. His family has noticed a decline over the past 6-8 months, since his hospital stay in December for gastric outlet obstruction. His appetite is okay, sporadic at times. They feel that he has been progressively worsening cognitively over the past few months. It takes him a while to get acclimated in the morning, he would sit and look at his food, needing prompting. Dinner would be the same. He is able to feed himself independently. He needs help with dressing and bathing. He used to love his favorite TV shows, now he seems to have lost interest in everything. He is very delusional and would not stop talking, he has friends that he talks to constantly and sets food around the dinner table. He sees ants crawling. He pulled his mother out of bed in the middle of the night trying to put her in the walker to go somewhere. His family has seen only two instances of aggression, one time he picked up a table in his room when he got upset, another time he used a knife to kill an ant. If spoken to firmly, it only takes him a second then starts following instructions.  They have the house set up with alarms if a door opens, and they have closed-circuit surveillance. He was getting up 3-4 times a night, but now that he is on Trazodone, this is better, he usually only wakes up between 2-2:30am and either sits up and lies down or sits in the den. He is easily redirected back if needed.   He used to lift weights at [employer]. He reports occasional headaches but denies any symptoms today. He lives with his mother. His sister and brother-in-law live next door and keep a close eye on them.   Laboratory Data: Lab Results  Component Value Date   WBC 8.8 12/20/2015   HGB 8.9 (L) 12/20/2015   HCT 27.6 (L) 12/20/2015   MCV 81.7 12/20/2015   PLT 259 12/20/2015     Chemistry      Component Value Date/Time   NA 142 12/20/2015 0725   K 3.0 (L) 12/20/2015 0725   CL 109 12/20/2015 0725   CO2 24 12/20/2015 0725   BUN 5 (L) 12/20/2015 0725   CREATININE 1.29 (H) 12/20/2015 0725      Component Value Date/Time   CALCIUM 7.7 (L) 12/20/2015 0725   ALKPHOS 97 12/17/2015 0533   AST 24 12/17/2015 0533   ALT 21 12/17/2015 0533   BILITOT 0.4 12/17/2015 0533     Lab Results  Component Value Date   TSH 0.676 12/15/2015   Lab Results  Component Value Date   VITAMINB12 3,405 (H) 08/10/2015     PAST MEDICAL HISTORY: Past Medical History:  Diagnosis Date  . Hypothyroidism   . PUD (peptic ulcer disease)   . Trisomy 21     PAST SURGICAL HISTORY: Past Surgical History:  Procedure Laterality Date  . CHOLECYSTECTOMY N/A 12/16/2015   Procedure: LAPAROSCOPIC CHOLECYSTECTOMY CONVERTED TO OPEN CHOLECYSTECTOMY WITH  CHOLANGIOGRAM POSSIBLE OPEN;  Surgeon: Ralene Ok, MD;  Location: [facility];  Service: General;  Laterality: N/A;  . ESOPHAGOGASTRODUODENOSCOPY N/A 08/11/2015   Procedure: ESOPHAGOGASTRODUODENOSCOPY (EGD);  Surgeon: Clarene Essex, MD;  Location[facility] ENDOSCOPY;  Service: Endoscopy;  Laterality: N/A;  . ROUX-EN-Y PROCEDURE  2007  . VAGOTOMY       MEDICATIONS:  Current Outpatient Prescriptions on File Prior to Visit  Medication Sig Dispense Refill  . levothyroxine (SYNTHROID, LEVOTHROID) 150 MCG tablet Take 150 mcg by mouth daily before breakfast.  10  . pantoprazole sodium (PROTONIX) 40 mg/20 mL PACK Take 20 mLs (40 mg total) by mouth 2 (two) times daily. 60 each 2  . sucralfate (CARAFATE) 1 g tablet Take 1 g by mouth 4 (four) times daily.    . feeding supplement, ENSURE ENLIVE, (ENSURE ENLIVE) LIQD Take 237 mLs by mouth 2 (two) times daily between meals. (Patient not taking: Reported on 06/20/2016) 237 mL 12  Trazodone 100mg  daily No current facility-administered medications on file prior to visit.     Current Outpatient Prescriptions on File Prior to Visit  Medication Sig Dispense Refill  . feeding supplement, ENSURE ENLIVE, (ENSURE ENLIVE) LIQD Take 237 mLs by mouth 2 (two) times daily between meals. (Patient not taking: Reported on 12/15/2015) 237 mL 12  . levothyroxine (SYNTHROID, LEVOTHROID) 150 MCG tablet Take 150 mcg by mouth daily before breakfast.  10  . pantoprazole sodium (PROTONIX) 40 mg/20 mL PACK Take 20 mLs (40 mg total) by mouth 2 (two) times daily. (Patient not taking: Reported on 12/15/2015) 60 each 2  . sucralfate (CARAFATE) 1 g tablet Take 1 g by mouth 4 (four) times daily.     No current facility-administered medications on file prior to visit.     ALLERGIES: Allergies  Allergen Reactions  . Penicillins Anaphylaxis    Has patient had a PCN reaction causing immediate rash, facial/tongue/throat swelling, SOB or lightheadedness with hypotension: Yes Has patient had a PCN reaction causing severe rash involving mucus membranes or skin necrosis: No Has patient had a PCN reaction that required hospitalization No Has patient had a PCN reaction occurring within the last 10 years: No If all of the above answers are "NO", then may proceed with Cephalosporin use.  . Sulfa Antibiotics Other (See Comments)     UNSPECIFIED     FAMILY HISTORY: Family History  Problem Relation Age of Onset  . Hypertension Mother   . Diabetes Mother   . Lymphoma Father   . Stroke Brother   . CAD Brother     SOCIAL HISTORY: Social History   Social History  . Marital status: Single    Spouse name: N/A  . Number of children: N/A  . Years of education: N/A   Occupational History  . Not on file.   Social History Main Topics  . Smoking status: Never Smoker  . Smokeless tobacco: Not on file  . Alcohol use No  . Drug use: Unknown  . Sexual activity: Not on file   Other Topics Concern  . Not on file   Social History Narrative  . 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 Vascular: No carotid bruits. Skin/Extremities: No rash, no edema Neurological Exam: Mental status: alert, oriented to person, place. Mild dysarthria. Fund of knowledge is reduced. Unable to test memory due to cognitive impairment. 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 Motor: 5/5 throughout with no pronator drift. Sensation: intact to light touch, cold, pin, vibration and joint position sense.  No  extinction to double simultaneous stimulation.  Romberg test negative Deep Tendon Reflexes: +2 throughout, no ankle clonus Plantar responses: downgoing bilaterally Cerebellar: no incoordination on finger to nose, heel to shin. No dysdiadochokinesia Gait: wide-based, slow, hunched posture, no ataxia Tremor: none  IMPRESSION: This is a pleasant 65 year old man with Down syndrome, presenting for evaluation and prognosis as family has noticed cognitive decline. We discussed difficulty in doing standardized memory testing in patients with Down syndrome, dementia is diagnosed by functional cognitive decline, which has been noticed by family. There is no good evidence that Aricept or Namenda are helpful for dementia associated with Down syndrome. They are happy with Trazodone for sleep. Depakote may be considered in the future if needed for behavioral changes. We discussed prognosis, need for 24/7 care, and home safety. All their questions were answered to the best of my ability. Family knows to call for any questions, he will follow-up on a prn basis.   Thank you for allowing me to participate in the care of this patient. Please do not hesitate to call for any questions or concerns.   Ellouise Newer, M.D.  CC: Dr. Donnie Coffin

## 2016-06-22 ENCOUNTER — Encounter: Payer: Self-pay | Admitting: Neurology

## 2016-06-22 DIAGNOSIS — F0281 Dementia in other diseases classified elsewhere with behavioral disturbance: Secondary | ICD-10-CM | POA: Insufficient documentation

## 2016-06-22 DIAGNOSIS — F02818 Dementia in other diseases classified elsewhere, unspecified severity, with other behavioral disturbance: Secondary | ICD-10-CM | POA: Insufficient documentation

## 2016-08-31 DIAGNOSIS — E039 Hypothyroidism, unspecified: Secondary | ICD-10-CM | POA: Diagnosis not present

## 2016-08-31 DIAGNOSIS — M150 Primary generalized (osteo)arthritis: Secondary | ICD-10-CM | POA: Diagnosis not present

## 2016-08-31 DIAGNOSIS — Q909 Down syndrome, unspecified: Secondary | ICD-10-CM | POA: Diagnosis not present

## 2016-08-31 DIAGNOSIS — K219 Gastro-esophageal reflux disease without esophagitis: Secondary | ICD-10-CM | POA: Diagnosis not present

## 2016-08-31 DIAGNOSIS — Z0000 Encounter for general adult medical examination without abnormal findings: Secondary | ICD-10-CM | POA: Diagnosis not present

## 2016-10-13 IMAGING — CR DG CHEST 1V PORT
1 series · 1 of 1 positions shown · non-contrast
Comparison: PA and lateral chest 01/18/2006.

CLINICAL DATA: Fever today.  Initial encounter.

EXAM:
PORTABLE CHEST 1 VIEW

[AP]
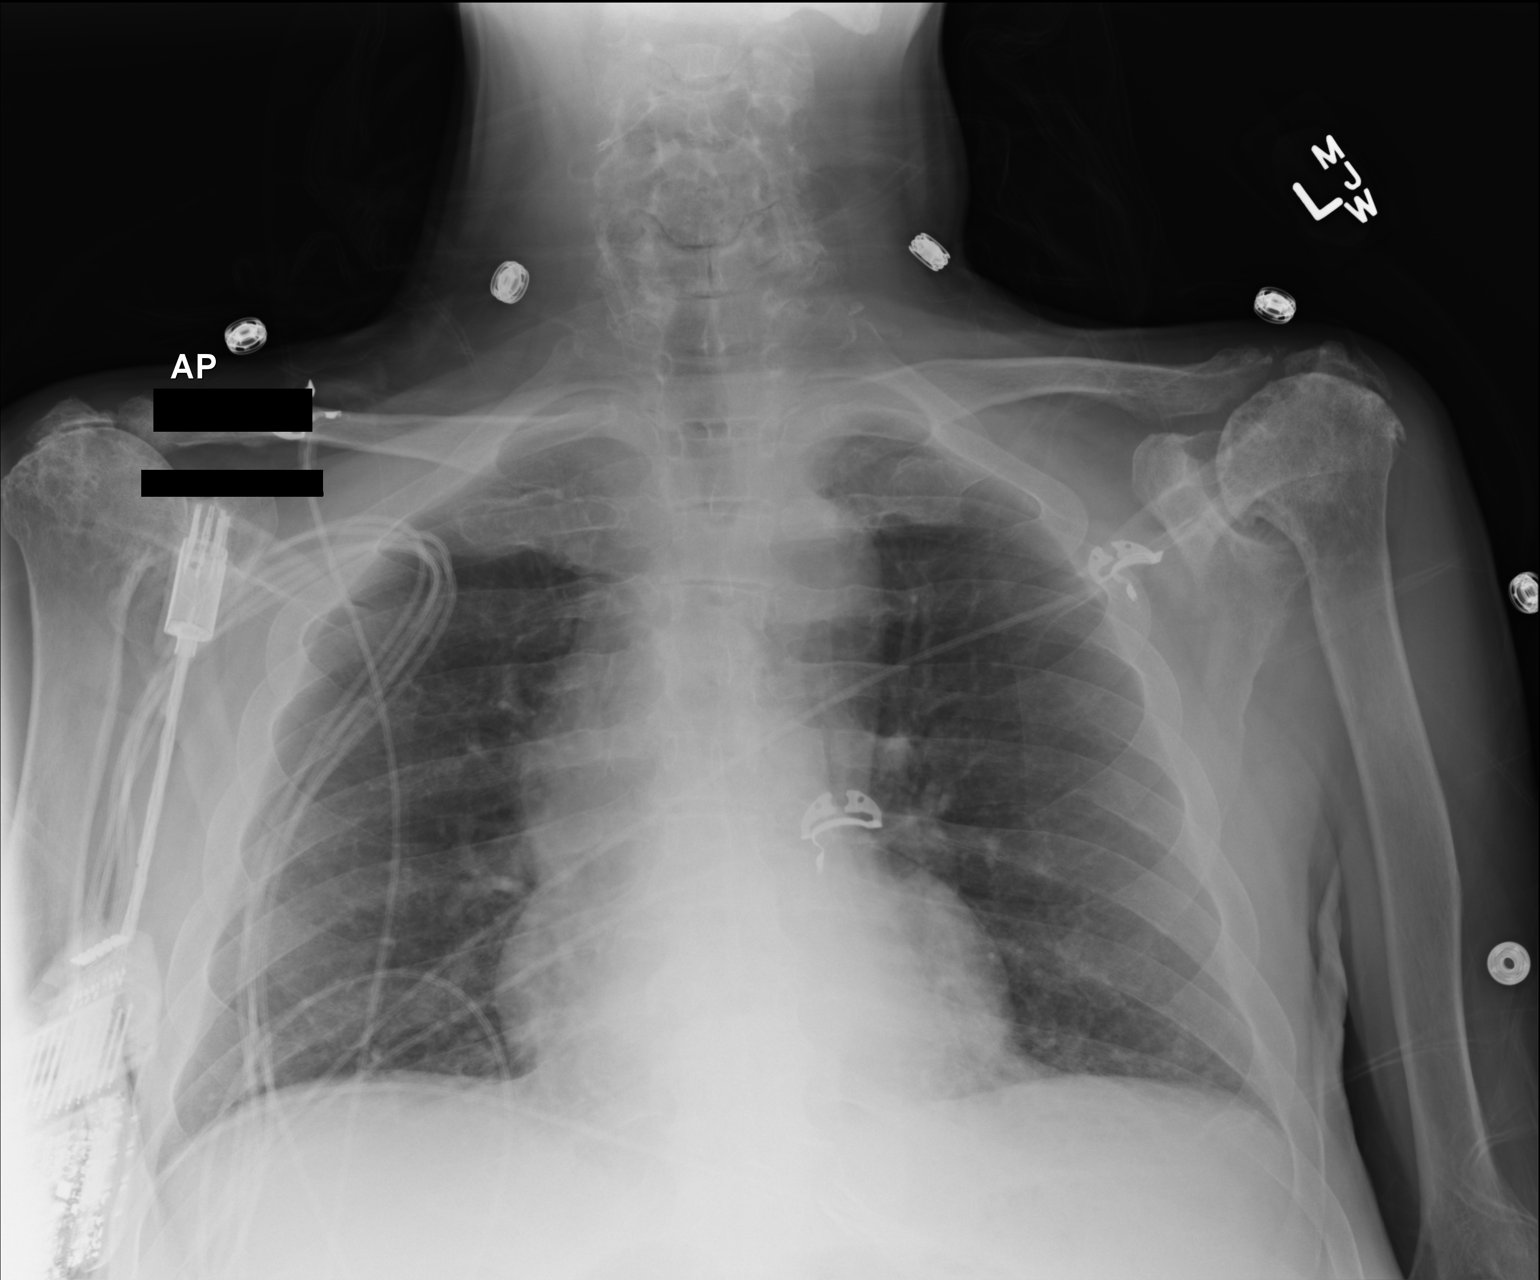

[1 of 1 positions shown; findings below may reference images not displayed]

FINDINGS: The lungs are clear. Heart size is normal. No pneumothorax or
pleural effusion. Marked degenerative change about the shoulders is
noted.
IMPRESSION: No acute disease.

## 2017-02-09 IMAGING — CT CT HEAD W/O CM
3 series · 15 of 30 positions shown, 17 images · non-contrast
Comparison: None.

CLINICAL DATA: Altered mental status.

EXAM:
CT HEAD WITHOUT CONTRAST
TECHNIQUE: Contiguous axial images were obtained from the base of the skull
through the vertex without intravenous contrast.

[Series 3: head without · axial · non-contrast · 0.48mm/px · z∈[-144,-54]mm · 4 of 32 slices shown]
[im 7/32  brain]
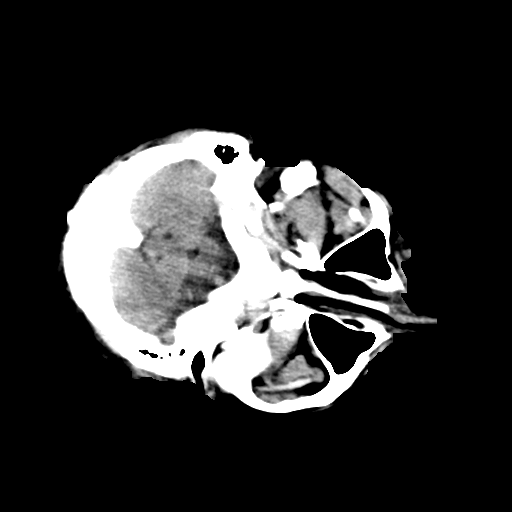
[im 13/32  brain]
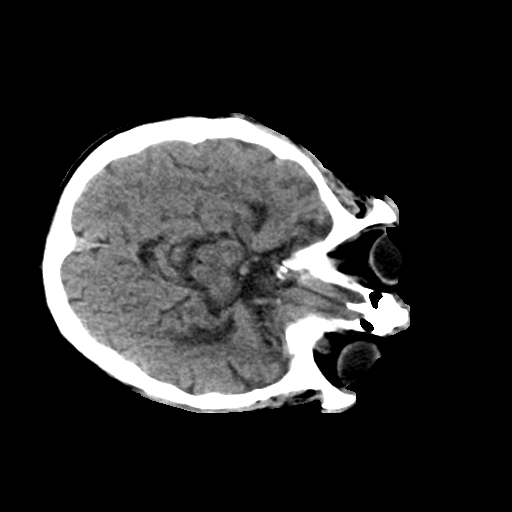
[im 19/32  brain]
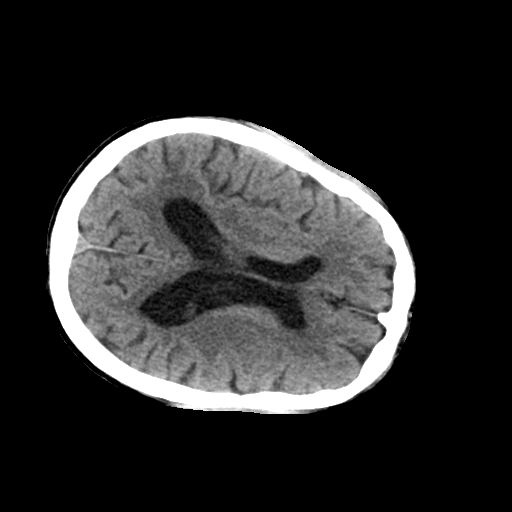
[im 25/32  brain]
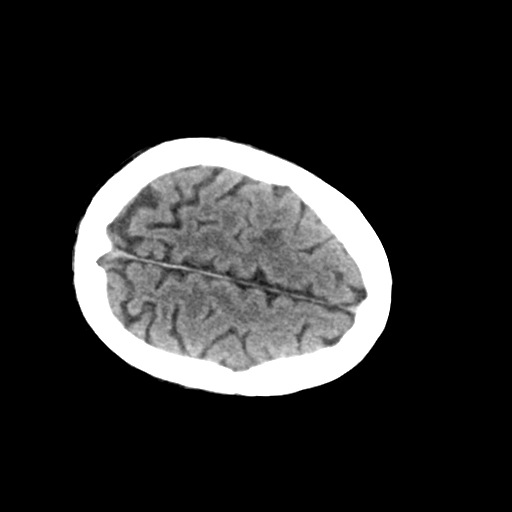

[Series 4: head bone · axial · 0.48mm/px · z∈[-164,-64]mm · 6 of 82 slices shown]
[im 6/82  bone]
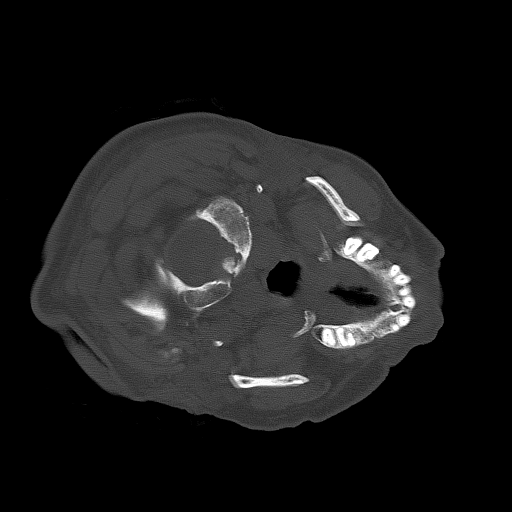
[im 16/82  bone]
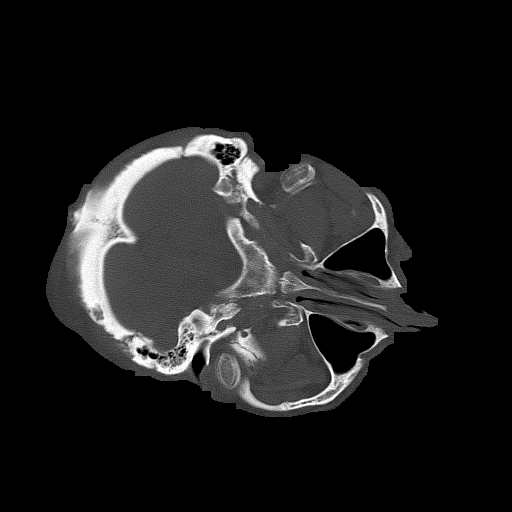
[im 26/82  bone]
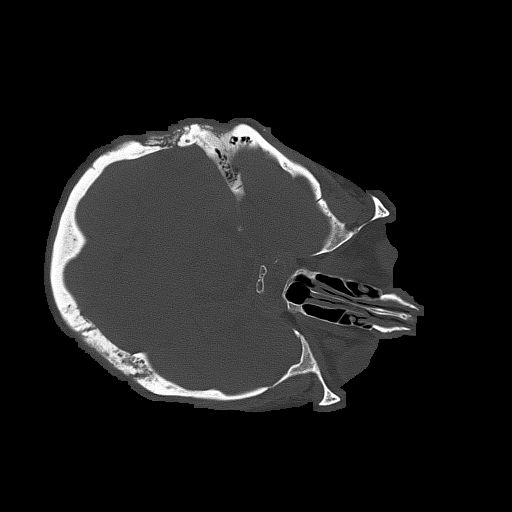
[im 36/82  bone]
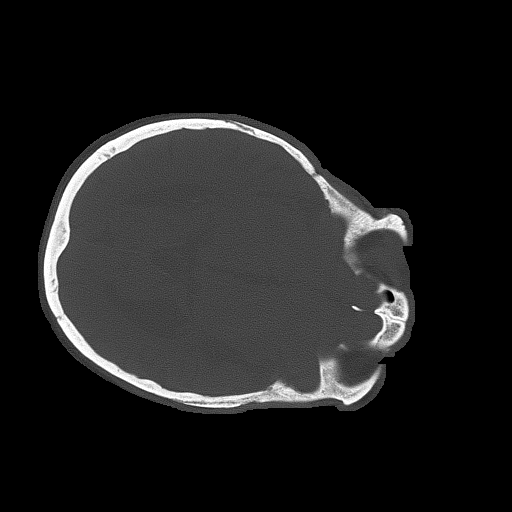
[im 46/82  bone]
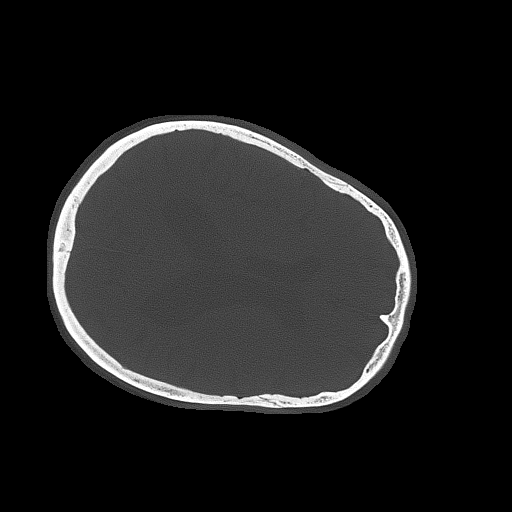
[im 56/82  bone]
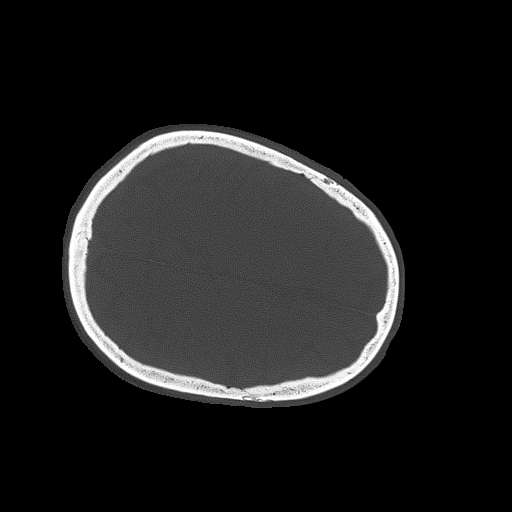

[Series 5: head without ax · axial · non-contrast · 0.32mm/px · z∈[-117,-19]mm · 5 of 32 slices shown, 7 images]
[im 6/32  brain]
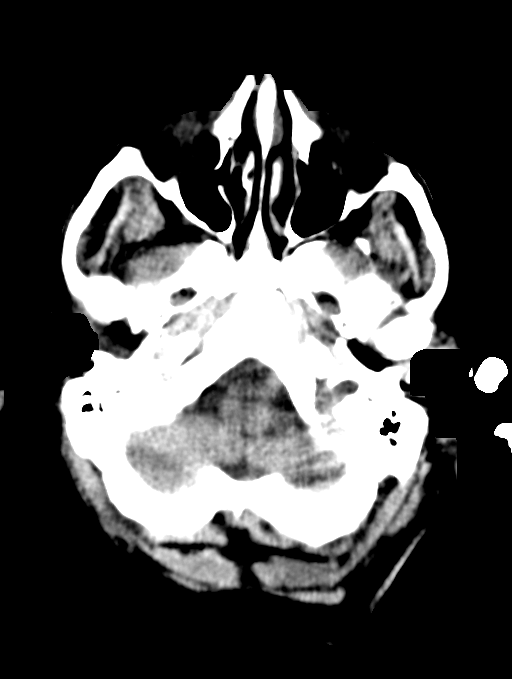
[im 6/32  bone]
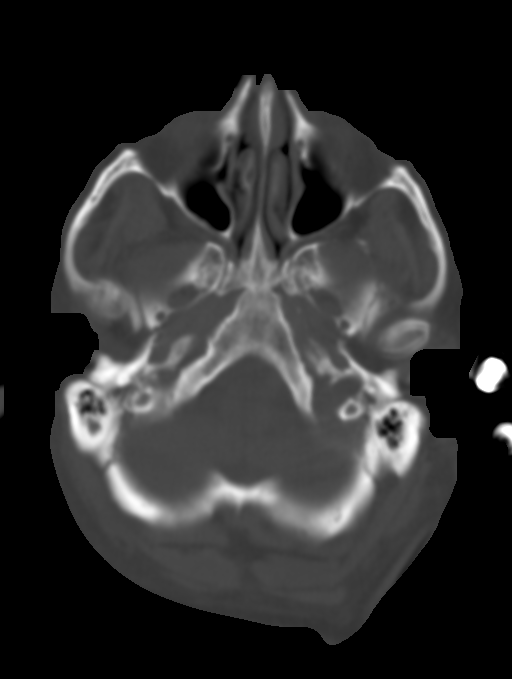
[im 11/32  brain]
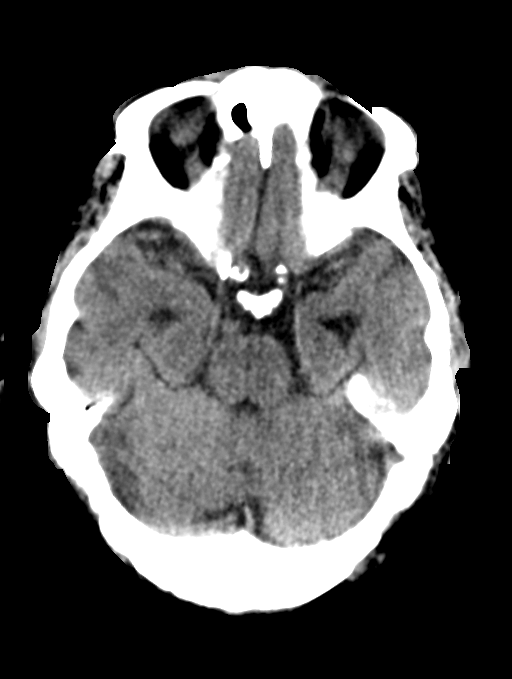
[im 16/32  brain]
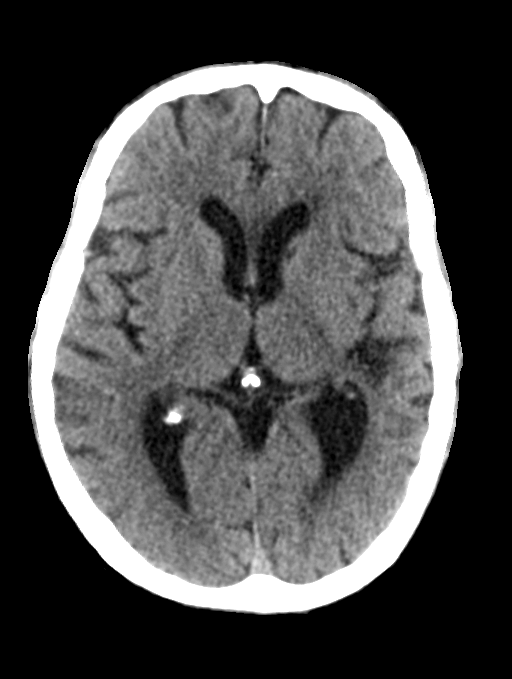
[im 21/32  brain]
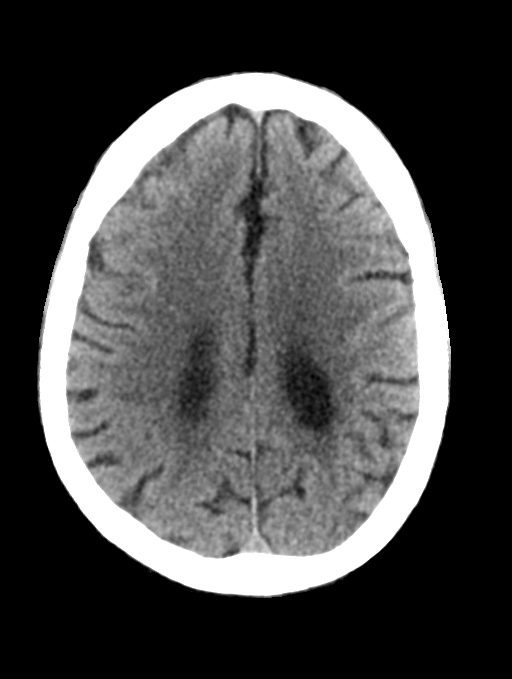
[im 26/32  brain]
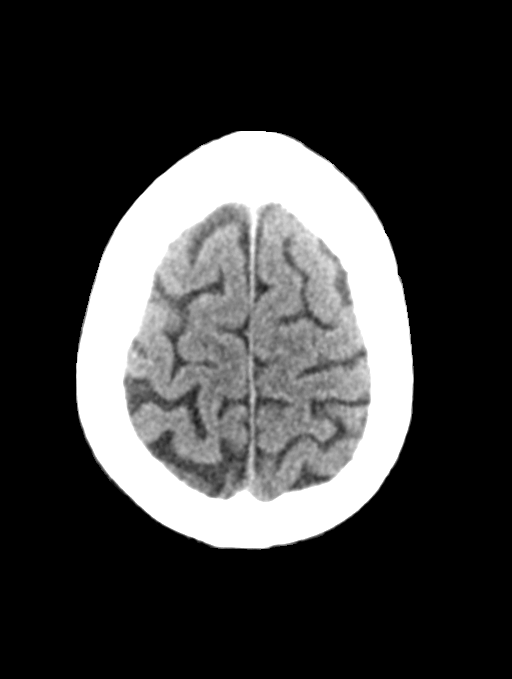
[im 26/32  bone]
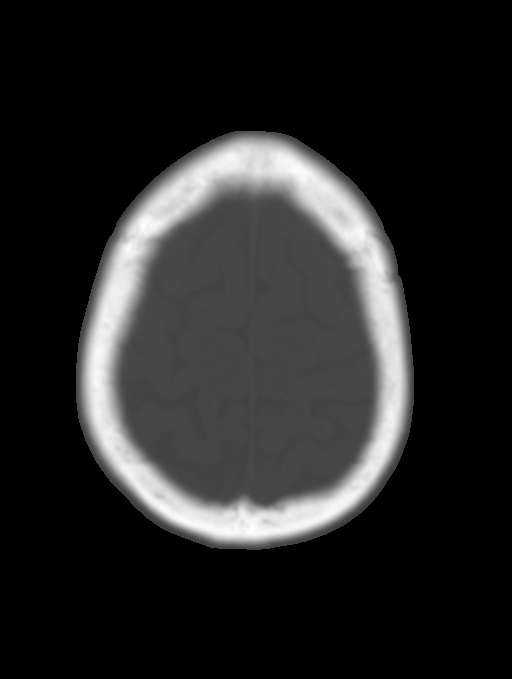

[15 of 30 positions shown; findings below may reference images not displayed]

FINDINGS: Bony calvarium appears intact. Minimal diffuse cortical atrophy is
noted. Mild chronic ischemic white matter disease is noted. Focal
low density is seen involving superior portion of right parietal
cortex concerning for infarction of indeterminate age. No mass
effect midline shift is noted. Ventricular size is within normal
limits. There is no evidence of mass lesion or hemorrhage.
IMPRESSION: Minimal diffuse cortical atrophy. Mild chronic ischemic white matter
disease. Focal low density seen in the superior portion of the right
parietal cortex concerning for infarction of indeterminate age. MRI
is recommended for further evaluation.

## 2017-02-09 IMAGING — CR DG CHEST 1V PORT
1 series · 1 of 1 positions shown · non-contrast
Comparison: 08/17/2015

CLINICAL DATA: Altered mental status

EXAM:
PORTABLE CHEST 1 VIEW

[AP]
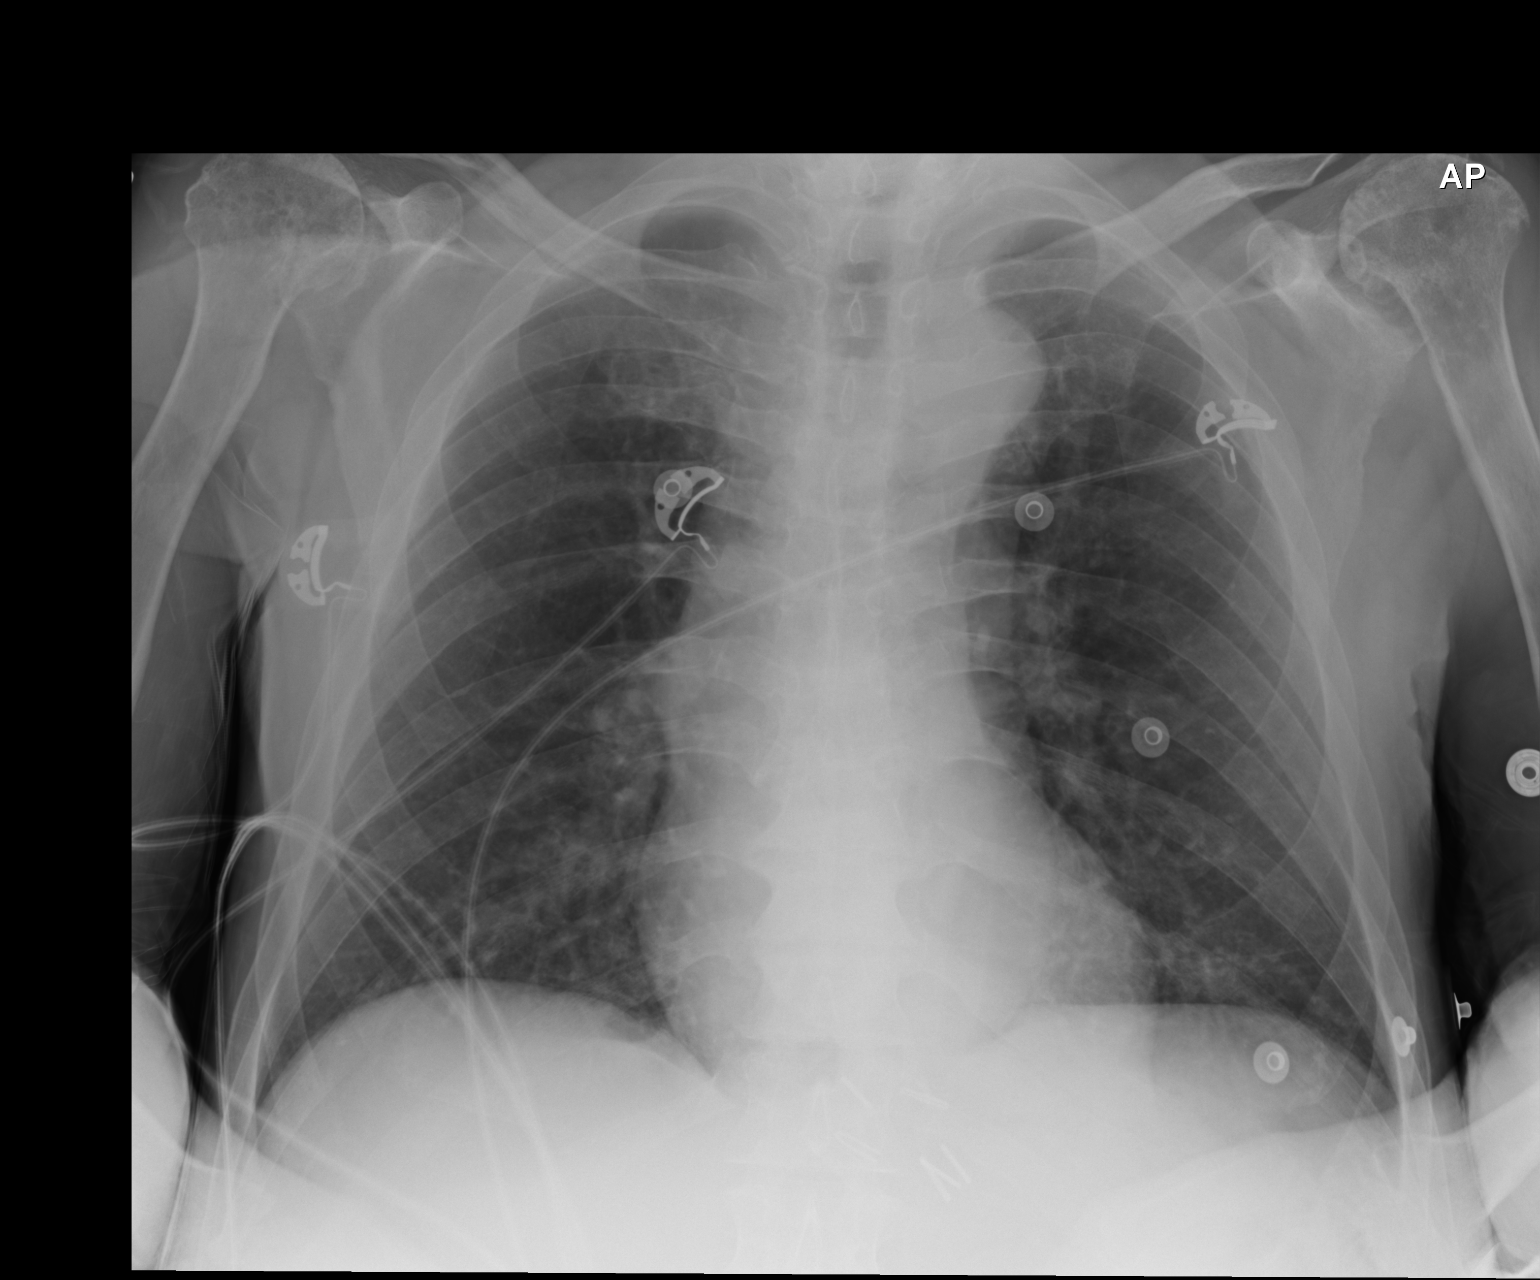

[1 of 1 positions shown; findings below may reference images not displayed]

FINDINGS: Heart and mediastinal contours are within normal limits. No focal
opacities or effusions. No acute bony abnormality. Degenerative
changes in the shoulders and thoracic spine.
IMPRESSION: No active cardiopulmonary disease.

## 2017-04-22 DIAGNOSIS — R0902 Hypoxemia: Secondary | ICD-10-CM | POA: Diagnosis not present

## 2017-04-22 DIAGNOSIS — F4489 Other dissociative and conversion disorders: Secondary | ICD-10-CM | POA: Diagnosis not present

## 2017-04-22 DIAGNOSIS — Q909 Down syndrome, unspecified: Secondary | ICD-10-CM | POA: Diagnosis not present

## 2017-04-23 DIAGNOSIS — Q909 Down syndrome, unspecified: Secondary | ICD-10-CM | POA: Diagnosis not present

## 2017-04-23 DIAGNOSIS — E039 Hypothyroidism, unspecified: Secondary | ICD-10-CM | POA: Diagnosis not present

## 2017-04-23 DIAGNOSIS — K219 Gastro-esophageal reflux disease without esophagitis: Secondary | ICD-10-CM | POA: Diagnosis not present

## 2017-04-23 DIAGNOSIS — F0280 Dementia in other diseases classified elsewhere without behavioral disturbance: Secondary | ICD-10-CM | POA: Diagnosis not present

## 2017-04-24 DIAGNOSIS — E039 Hypothyroidism, unspecified: Secondary | ICD-10-CM | POA: Diagnosis not present

## 2017-04-24 DIAGNOSIS — K219 Gastro-esophageal reflux disease without esophagitis: Secondary | ICD-10-CM | POA: Diagnosis not present

## 2017-04-24 DIAGNOSIS — F0280 Dementia in other diseases classified elsewhere without behavioral disturbance: Secondary | ICD-10-CM | POA: Diagnosis not present

## 2017-04-24 DIAGNOSIS — Q909 Down syndrome, unspecified: Secondary | ICD-10-CM | POA: Diagnosis not present

## 2017-04-26 DIAGNOSIS — Q909 Down syndrome, unspecified: Secondary | ICD-10-CM | POA: Diagnosis not present

## 2017-04-26 DIAGNOSIS — F0280 Dementia in other diseases classified elsewhere without behavioral disturbance: Secondary | ICD-10-CM | POA: Diagnosis not present

## 2017-04-26 DIAGNOSIS — E039 Hypothyroidism, unspecified: Secondary | ICD-10-CM | POA: Diagnosis not present

## 2017-04-26 DIAGNOSIS — K219 Gastro-esophageal reflux disease without esophagitis: Secondary | ICD-10-CM | POA: Diagnosis not present

## 2017-04-28 DIAGNOSIS — F0280 Dementia in other diseases classified elsewhere without behavioral disturbance: Secondary | ICD-10-CM | POA: Diagnosis not present

## 2017-04-28 DIAGNOSIS — K219 Gastro-esophageal reflux disease without esophagitis: Secondary | ICD-10-CM | POA: Diagnosis not present

## 2017-04-28 DIAGNOSIS — E039 Hypothyroidism, unspecified: Secondary | ICD-10-CM | POA: Diagnosis not present

## 2017-04-28 DIAGNOSIS — Q909 Down syndrome, unspecified: Secondary | ICD-10-CM | POA: Diagnosis not present

## 2017-04-29 DIAGNOSIS — F0280 Dementia in other diseases classified elsewhere without behavioral disturbance: Secondary | ICD-10-CM | POA: Diagnosis not present

## 2017-04-29 DIAGNOSIS — K219 Gastro-esophageal reflux disease without esophagitis: Secondary | ICD-10-CM | POA: Diagnosis not present

## 2017-04-29 DIAGNOSIS — Q909 Down syndrome, unspecified: Secondary | ICD-10-CM | POA: Diagnosis not present

## 2017-04-29 DIAGNOSIS — E039 Hypothyroidism, unspecified: Secondary | ICD-10-CM | POA: Diagnosis not present

## 2017-04-30 DIAGNOSIS — F0280 Dementia in other diseases classified elsewhere without behavioral disturbance: Secondary | ICD-10-CM | POA: Diagnosis not present

## 2017-04-30 DIAGNOSIS — Q909 Down syndrome, unspecified: Secondary | ICD-10-CM | POA: Diagnosis not present

## 2017-04-30 DIAGNOSIS — E039 Hypothyroidism, unspecified: Secondary | ICD-10-CM | POA: Diagnosis not present

## 2017-04-30 DIAGNOSIS — K219 Gastro-esophageal reflux disease without esophagitis: Secondary | ICD-10-CM | POA: Diagnosis not present

## 2017-05-01 DIAGNOSIS — K219 Gastro-esophageal reflux disease without esophagitis: Secondary | ICD-10-CM | POA: Diagnosis not present

## 2017-05-01 DIAGNOSIS — F0280 Dementia in other diseases classified elsewhere without behavioral disturbance: Secondary | ICD-10-CM | POA: Diagnosis not present

## 2017-05-01 DIAGNOSIS — Q909 Down syndrome, unspecified: Secondary | ICD-10-CM | POA: Diagnosis not present

## 2017-05-01 DIAGNOSIS — E039 Hypothyroidism, unspecified: Secondary | ICD-10-CM | POA: Diagnosis not present

## 2017-05-04 DEATH — deceased
